# Patient Record
Sex: Male | Born: 1950 | Race: White | Hispanic: No | Marital: Married | Smoking: Never smoker
Health system: Southern US, Community
[De-identification: ages and names within clinical notes are randomized; demographics above are authoritative.]

## PROBLEM LIST (undated history)

## (undated) DIAGNOSIS — R972 Elevated prostate specific antigen [PSA]: Secondary | ICD-10-CM

## (undated) DIAGNOSIS — E785 Hyperlipidemia, unspecified: Secondary | ICD-10-CM

## (undated) DIAGNOSIS — C61 Malignant neoplasm of prostate: Secondary | ICD-10-CM

## (undated) DIAGNOSIS — R519 Headache, unspecified: Secondary | ICD-10-CM

## (undated) DIAGNOSIS — T7840XA Allergy, unspecified, initial encounter: Secondary | ICD-10-CM

## (undated) DIAGNOSIS — Z973 Presence of spectacles and contact lenses: Secondary | ICD-10-CM

## (undated) DIAGNOSIS — K219 Gastro-esophageal reflux disease without esophagitis: Secondary | ICD-10-CM

## (undated) DIAGNOSIS — N400 Enlarged prostate without lower urinary tract symptoms: Secondary | ICD-10-CM

## (undated) DIAGNOSIS — M1990 Unspecified osteoarthritis, unspecified site: Secondary | ICD-10-CM

## (undated) HISTORY — PX: TONSILLECTOMY: SUR1361

## (undated) HISTORY — DX: Allergy, unspecified, initial encounter: T78.40XA

## (undated) HISTORY — DX: Gastro-esophageal reflux disease without esophagitis: K21.9

## (undated) HISTORY — DX: Benign prostatic hyperplasia without lower urinary tract symptoms: N40.0

## (undated) HISTORY — PX: PROSTATE SURGERY: SHX751

## (undated) HISTORY — PX: KNEE ARTHROSCOPY W/ MENISCAL REPAIR: SHX1877

## (undated) HISTORY — PX: WISDOM TOOTH EXTRACTION: SHX21

## (undated) HISTORY — DX: Unspecified osteoarthritis, unspecified site: M19.90

## (undated) HISTORY — PX: PROSTATE BIOPSY: SHX241

## (undated) HISTORY — PX: ROTATOR CUFF REPAIR: SHX139

## (undated) HISTORY — PX: OTHER SURGICAL HISTORY: SHX169

## (undated) HISTORY — DX: Hyperlipidemia, unspecified: E78.5

---

## 1995-03-12 HISTORY — PX: OTHER SURGICAL HISTORY: SHX169

## 2009-04-03 LAB — LIPID PANEL
Cholesterol: 180 (ref 0–200)
HDL: 42 (ref 35–70)
LDL Cholesterol: 109
Triglycerides: 143 (ref 40–160)

## 2009-04-03 LAB — PSA: PSA: 1.41

## 2012-08-27 LAB — LIPID PANEL
Cholesterol: 190 (ref 0–200)
HDL: 38 (ref 35–70)
LDL Cholesterol: 124
Triglycerides: 138 (ref 40–160)

## 2014-03-11 HISTORY — PX: POLYPECTOMY: SHX149

## 2014-03-11 HISTORY — PX: COLONOSCOPY: SHX174

## 2016-01-05 ENCOUNTER — Encounter: Payer: Self-pay | Admitting: *Deleted

## 2016-01-05 ENCOUNTER — Ambulatory Visit
Admission: EM | Admit: 2016-01-05 | Discharge: 2016-01-05 | Disposition: A | Discharge status: Home / Self Care | Payer: BLUE CROSS/BLUE SHIELD | Attending: Family Medicine | Admitting: Family Medicine

## 2016-01-05 DIAGNOSIS — J069 Acute upper respiratory infection, unspecified: Secondary | ICD-10-CM

## 2016-01-05 DIAGNOSIS — J301 Allergic rhinitis due to pollen: Secondary | ICD-10-CM | POA: Diagnosis not present

## 2016-01-05 MED ORDER — FLUTICASONE PROPIONATE 50 MCG/ACT NA SUSP: 2.0000 | Freq: Every day | NASAL | 0 refills | Status: DC

## 2016-01-05 MED ORDER — AZITHROMYCIN 250 MG PO TABS: ORAL_TABLET | ORAL | 0 refills | Status: DC

## 2016-01-05 NOTE — ED Provider Notes (Signed)
CSN: [id number]     Arrival date & time 01/05/16  1031 History   First MD Initiated Contact with Patient 01/05/16 1054     Chief Complaint  Patient presents with  . Nasal Congestion   (Consider location/radiation/quality/duration/timing/severity/associated sxs/prior Treatment) HPI  This a 65 year old male who is visiting from [place] presents with nasal congestion facial pain for 5 days. He states that the symptoms actually started prior to his arrival here by about 3 days. Denies any fever or chills. He states that his head is  full and he has headache. He has had a mild cough since the onset. It is nonproductive.       History reviewed. No pertinent past medical history. Past Surgical History:  Procedure Laterality Date  . right knee surgery     History reviewed. No pertinent family history. Social History  Substance Use Topics  . Smoking status: Never Smoker  . Smokeless tobacco: Never Used  . Alcohol use No    Review of Systems  Constitutional: Positive for activity change. Negative for appetite change, chills, fatigue and fever.  HENT: Positive for congestion, postnasal drip, rhinorrhea, sinus pressure and sneezing.   Respiratory: Positive for cough.   All other systems reviewed and are negative.   Allergies  Review of patient's allergies indicates no known allergies.  Home Medications   Prior to Admission medications   Medication Sig Start Date End Date Taking? Authorizing Provider  azithromycin (ZITHROMAX Z-PAK) 250 MG tablet Take as per package instructions 01/05/16   Lorin Picket, PA-C  fluticasone [facility]) 50 MCG/ACT nasal spray Place 2 sprays into both nostrils daily. 01/05/16   Lorin Picket, PA-C   Meds Ordered and Administered this Visit  Medications - No data to display  BP 127/82 (BP Location: Left Arm)   Pulse 69   Temp 98 F (36.7 C) (Oral)   Resp 16   Ht 6' (1.829 m)   Wt 180 lb (81.6 kg)   SpO2 100%   BMI 24.41 kg/m  No data  found.   Physical Exam  Constitutional: He is oriented to person, place, and time. He appears well-developed and well-nourished. No distress.  HENT:  Head: Normocephalic and atraumatic.  Right Ear: External ear normal.  Left Ear: External ear normal.  Mouth/Throat: Oropharynx is clear and moist. No oropharyngeal exudate.  Nasal mucosa is erythematous and swollen  Eyes: EOM are normal. Pupils are equal, round, and reactive to light.  Neck: Normal range of motion. Neck supple.  Pulmonary/Chest: Effort normal and breath sounds normal. No respiratory distress. He has no wheezes. He has no rales.  Musculoskeletal: Normal range of motion.  Lymphadenopathy:    He has no cervical adenopathy.  Neurological: He is alert and oriented to person, place, and time.  Skin: Skin is warm and dry. He is not diaphoretic.  Psychiatric: He has a normal mood and affect. His behavior is normal. Judgment and thought content normal.  Nursing note and vitals reviewed.   Urgent Care Course   Clinical Course    Procedures (including critical care time)  Labs Review Labs Reviewed - No data to display  Imaging Review No results found.   Visual Acuity Review  Right Eye Distance:   Left Eye Distance:   Bilateral Distance:    Right Eye Near:   Left Eye Near:    Bilateral Near:         MDM   1. Upper respiratory tract infection, unspecified type   2. Acute  seasonal allergic rhinitis due to pollen    Discharge Medication List as of 01/05/2016 11:22 AM    START taking these medications   Details  azithromycin (ZITHROMAX Z-PAK) 250 MG tablet Take as per package instructions, Normal    fluticasone (FLONASE) 50 MCG/ACT nasal spray Place 2 sprays into both nostrils daily., Starting Fri 01/05/2016, Normal      Plan: 1. Test/x-ray results and diagnosis reviewed with patient 2. rx as per orders; risks, benefits, potential side effects reviewed with patient 3. Recommend supportive treatment  with Flonase and Zyrtec. Consider using a Nettie pot. We will up with his primary care physician on his return to [place].  4. F/u prn if symptoms worsen or don't improve     Lorin Picket, PA-C 01/05/16 1140

## 2016-01-05 NOTE — ED Triage Notes (Signed)
Patient has had symptoms of nasal congestion for five days.

## 2016-08-20 LAB — LIPID PANEL
Cholesterol: 202 — AB (ref 0–200)
HDL: 46 (ref 35–70)
LDL Cholesterol: 133
Triglycerides: 117 (ref 40–160)

## 2016-08-20 LAB — BASIC METABOLIC PANEL
BUN: 19 (ref 4–21)
Creatinine: 1 (ref 0.6–1.3)

## 2018-09-26 NOTE — Progress Notes (Addendum)
[facility] [address], [place], [place] [address] [phone]  Date:  09/28/2018   Name:  Johnny Davenport   DOB:  May 25, 1950   MRN:  [id number]  PCP:  Darreld Mclean, MD    Chief Complaint: New Patient (Initial Visit) (left thumb pain) and Gastroesophageal Reflux (comes and goes, no otc )   History of Present Illness:  Johnny Davenport is a 68 y.o. very pleasant male patient who presents with the following:  Gentleman here as a new patient to establish care Married to [place] who I also met recently as a new patient  Here today as a new patient to establish care- he lives in the [place] area with his wife  Recently moved here from [place] to be closer to his son in [place]  Very sadly his son and DIL lost their unborn son recently, a few days prior to planned delivery.  He got the cord wrapped around his neck and was stillborn.  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, fast food He enjoys golf and walking- he gets lot of exercise   He has been a generally healthy guy He does not take any rx medications  He has left thumb pain at the MCP for about 8 months It bothers him at night- not with golf Playing golf actually seems to help  It may seem to get caught/ stuck in the joint No prior injury or fracture to this area  He notes reflux when he eats certain foods- has noted it for a year or so  Bread and pasta seems to bother him the most If he does not eat bread or pasta he does well He may take a tums prn and this helps  Most recent labs about a year ago- he is fasting today  Colon: 12/17; he was given 5 year recall He thinks he got his pneumonia vaccines already He also got shingrix- both doses- done about a year ago   He has  requested medical records for [place] Also brings in several years of labs for review/ abstract  I do notice that his PSA has trended up some-  2011 1.4 2013 1.98 2016 2.02 2018 3.0 2019 3.75 He has not been referred to see urology  There are no active problems to display for this patient.   History reviewed. No pertinent past medical history.  Past Surgical History:  Procedure Laterality Date  . right knee surgery    . ROTATOR CUFF REPAIR Left     Social History   Tobacco Use  . Smoking status: Never Smoker  . Smokeless tobacco: Never Used  Substance Use Topics  . Alcohol use: No  . Drug use: No    History reviewed. No pertinent family history.  No Known Allergies  Medication list has been reviewed and updated.  No current outpatient medications on file prior to visit.   No current facility-administered medications on file prior to visit.     Review of Systems:  As per HPI- otherwise negative.   Physical Examination: Vitals:   09/28/18 0915  BP: 118/78  Pulse: 77  Resp: 16  Temp: 98.1 F (36.7 C)  SpO2: 97%   Vitals:   09/28/18 0915  Weight: 185 lb (83.9 kg)  Height: 6' (1.829 m)   Body mass index is 25.09 kg/m. Ideal Body Weight: Weight in (lb) to have BMI = 25: 183.9  GEN: WDWN, NAD, Non-toxic, A & O x 3, normal weight, looks well  HEENT: Atraumatic, Normocephalic. Neck supple. No masses, No LAD.  TM wnl, PEERL Ears and Nose: No external deformity. CV: RRR, No M/G/R. No JVD. No thrill. No extra heart sounds. PULM: CTA B, no wheezes, crackles, rhonchi. No retractions. No resp. distress. No accessory muscle use. ABD: S, NT, ND, +BS. No rebound. No HSM. EXTR: No c/c/e NEURO Normal gait.  PSYCH: Normally interactive. Conversant. Not depressed or anxious appearing.  Calm demeanor.  The left first MCP shows thickening of the joint and some snapping/ popping with ROM No redness, heat or tenderness    Assessment and Plan:   ICD-10-CM   1. Reflux  esophagitis  K21.0 H. pylori breath test  2. Screening for hyperlipidemia  Z13.220 Lipid panel  3. Screening for deficiency anemia  Z13.0 CBC  4. Screening for diabetes mellitus  Z13.1 Comprehensive metabolic panel  5. Screening for prostate cancer  Z12.5 PSA, Medicare ( [facility] only)  6. Increased prostate specific antigen (PSA) velocity  R97.20 PSA, Medicare ( [facility] only)  7. Medication monitoring encounter  Z51.81 CBC    Comprehensive metabolic panel  8. Mourning  F43.21    Labs pending as above  It was a pleasure to meet you today!  Take care and I will be in touch with your labs asap Let me know if you would like an x-ray and/or ortho referral for your left thumb.  Otherwise, OTC tylenol as needed is a safe option tor pain.  NEVER take more than recommended on the label   We will look for any evidence of H pylori stomach infection as possible cause of your reflux sx.  Assuming this is negative, will plan to rx a PPI for you to use for a few weeks.  Please keep me posted about these symptoms  As we discussed, if your PSA has continued to rise I would suggest that we have you see urology for a consultation  I am so sorry for the tragic loss of your grandson.  Please let me know if you are not doing ok (as ok as can be expected) going forward    Follow-up: No follow-ups on file.  No orders of the defined types were placed in this encounter.  Orders Placed This Encounter  Procedures  . CBC  . Comprehensive metabolic panel  . Lipid panel  . H. pylori breath test  . PSA, Medicare ( [facility] only)    '@SIGN' @    Signed Lamar Blinks, MD  Received his labs, message to patient  Blood counts are normal Metabolic profile is normal Your cholesterol is really not bad.  However, with these numbers your estimated 10-year risk of cardiovascular disease is elevated-see below  The 10-year ASCVD risk score Mikey Bussing DC Brooke Bonito., et al., 2013) is: 15.2%   Values used  to calculate the score:     Age: 52 years     Sex: Male     Is Non-Hispanic African American: No     Diabetic: No     Tobacco smoker: No     Systolic Blood Pressure: 397 mmHg     Is BP treated: No     HDL Cholesterol: 45.1 mg/dL     Total Cholesterol: 191 mg/dL  A cholesterol medication may help to reduce your risk of heart attack or stroke.  Is this something you would be interested in using?  Finally, your PSA has gone up just a bit since last year Your level in 2019 was 3.75.  I would consider this to be no significant change, which is good although I would prefer to see it go down  I would suggest that we either recheck a PSA in 6 months, or refer you to urology for their opinion  Let me know your thoughts on cholesterol medication and your PSA Results for orders placed or performed in visit on 09/28/18  CBC  Result Value Ref Range   WBC 5.0 4.0 - 10.5 K/uL   RBC 4.33 4.22 - 5.81 Mil/uL   Platelets 202.0 150.0 - 400.0 K/uL   Hemoglobin 14.4 13.0 - 17.0 g/dL   HCT 42.3 39.0 - 52.0 %   MCV 97.7 78.0 - 100.0 fl   MCHC 34.0 30.0 - 36.0 g/dL   RDW 13.0 11.5 - 15.5 %  Comprehensive metabolic panel  Result Value Ref Range   Sodium 139 135 - 145 mEq/L   Potassium 4.7 3.5 - 5.1 mEq/L   Chloride 104 96 - 112 mEq/L   CO2 28 19 - 32 mEq/L   Glucose, Bld 85 70 - 99 mg/dL   BUN 19 6 - 23 mg/dL   Creatinine, Ser 1.00 0.40 - 1.50 mg/dL   Total Bilirubin 0.7 0.2 - 1.2 mg/dL   Alkaline Phosphatase 52 39 - 117 U/L   AST 19 0 - 37 U/L   ALT 16 0 - 53 U/L   Total Protein 6.3 6.0 - 8.3 g/dL   Albumin 4.5 3.5 - 5.2 g/dL   Calcium 9.1 8.4 - 10.5 mg/dL   GFR 73.91 >60.00 mL/min  Lipid panel  Result Value Ref Range   Cholesterol 191 0 - 200 mg/dL   Triglycerides 120.0 0.0 - 149.0 mg/dL   HDL 45.10 >39.00 mg/dL   VLDL 24.0 0.0 - 40.0 mg/dL   LDL Cholesterol 122 (H) 0 - 99 mg/dL   Total CHOL/HDL Ratio 4    NonHDL 145.55   PSA, Medicare ( [facility] only)  Result Value Ref Range    PSA 3.80 0.10 - 4.00 ng/ml   The 10-year ASCVD risk score Mikey Bussing DC Jr., et al., 2013) is: 15.2%   Values used to calculate the score:     Age: 6 years     Sex: Male     Is Non-Hispanic African American: No     Diabetic: No     Tobacco smoker: No     Systolic Blood Pressure: 758 mmHg     Is BP treated: No     HDL Cholesterol: 45.1 mg/dL     Total Cholesterol: 191 mg/dL

## 2018-09-28 ENCOUNTER — Encounter: Payer: Self-pay | Admitting: Family Medicine

## 2018-09-28 ENCOUNTER — Other Ambulatory Visit: Payer: Self-pay

## 2018-09-28 ENCOUNTER — Ambulatory Visit (INDEPENDENT_AMBULATORY_CARE_PROVIDER_SITE_OTHER): Payer: Medicare Other | Admitting: Family Medicine

## 2018-09-28 VITALS — BP 118/78 | HR 77 | Temp 98.1°F | Resp 16 | Ht 72.0 in | Wt 185.0 lb

## 2018-09-28 DIAGNOSIS — Z13220 Encounter for screening for lipoid disorders: Secondary | ICD-10-CM

## 2018-09-28 DIAGNOSIS — R972 Elevated prostate specific antigen [PSA]: Secondary | ICD-10-CM | POA: Diagnosis not present

## 2018-09-28 DIAGNOSIS — K2100 Gastro-esophageal reflux disease with esophagitis, without bleeding: Secondary | ICD-10-CM

## 2018-09-28 DIAGNOSIS — Z130 Encounter for screening for diseases of the blood and blood-forming organs and certain disorders involving the immune mechanism: Secondary | ICD-10-CM | POA: Diagnosis not present

## 2018-09-28 DIAGNOSIS — Z131 Encounter for screening for diabetes mellitus: Secondary | ICD-10-CM

## 2018-09-28 DIAGNOSIS — Z5181 Encounter for therapeutic drug level monitoring: Secondary | ICD-10-CM

## 2018-09-28 DIAGNOSIS — Z125 Encounter for screening for malignant neoplasm of prostate: Secondary | ICD-10-CM

## 2018-09-28 DIAGNOSIS — F4321 Adjustment disorder with depressed mood: Secondary | ICD-10-CM

## 2018-09-28 LAB — LIPID PANEL
Cholesterol: 191 mg/dL (ref 0–200)
HDL: 45.1 mg/dL (ref >39.00)
LDL Cholesterol: 122 mg/dL — ABNORMAL HIGH (ref 0–99)
NonHDL: 145.55
Total CHOL/HDL Ratio: 4
Triglycerides: 120 mg/dL (ref 0.0–149.0)
VLDL: 24 mg/dL (ref 0.0–40.0)

## 2018-09-28 LAB — CBC
HCT: 42.3 % (ref 39.0–52.0)
Hemoglobin: 14.4 g/dL (ref 13.0–17.0)
MCHC: 34 g/dL (ref 30.0–36.0)
MCV: 97.7 fl (ref 78.0–100.0)
Platelets: 202 10*3/uL (ref 150.0–400.0)
RBC: 4.33 Mil/uL (ref 4.22–5.81)
RDW: 13 % (ref 11.5–15.5)
WBC: 5 10*3/uL (ref 4.0–10.5)

## 2018-09-28 LAB — COMPREHENSIVE METABOLIC PANEL
ALT: 16 U/L (ref 0–53)
AST: 19 U/L (ref 0–37)
Albumin: 4.5 g/dL (ref 3.5–5.2)
Alkaline Phosphatase: 52 U/L (ref 39–117)
BUN: 19 mg/dL (ref 6–23)
CO2: 28 mEq/L (ref 19–32)
Calcium: 9.1 mg/dL (ref 8.4–10.5)
Chloride: 104 mEq/L (ref 96–112)
Creatinine, Ser: 1 mg/dL (ref 0.40–1.50)
GFR: 73.91 mL/min (ref >60.00)
Glucose, Bld: 85 mg/dL (ref 70–99)
Potassium: 4.7 mEq/L (ref 3.5–5.1)
Sodium: 139 mEq/L (ref 135–145)
Total Bilirubin: 0.7 mg/dL (ref 0.2–1.2)
Total Protein: 6.3 g/dL (ref 6.0–8.3)

## 2018-09-28 LAB — PSA, MEDICARE: PSA: 3.8 ng/ml (ref 0.10–4.00)

## 2018-09-28 NOTE — Patient Instructions (Signed)
It was a pleasure to meet you today!  Take care and I will be in touch with your labs asap Let me know if you would like an x-ray and/or ortho referral for your left thumb.  Otherwise, OTC tylenol as needed is a safe option tor pain.  NEVER take more than recommended on the label   We will look for any evidence of H pylori stomach infection as possible cause of your reflux sx.  Assuming this is negative, will plan to rx a PPI for you to use for a few weeks.  Please keep me posted about these symptoms  As we discussed, if your PSA has continued to rise I would suggest that we have you see urology for a consultation  I am so sorry for the tragic loss of your grandson.  Please let me know if you are not doing ok (as ok as can be expected) going forward

## 2018-09-29 ENCOUNTER — Encounter: Payer: Self-pay | Admitting: Family Medicine

## 2018-09-29 ENCOUNTER — Other Ambulatory Visit: Payer: Self-pay | Admitting: Family Medicine

## 2018-09-29 LAB — H. PYLORI BREATH TEST: H. pylori Breath Test: NOT DETECTED

## 2018-09-29 MED ORDER — OMEPRAZOLE 40 MG PO CPDR: 40.0000 mg | DELAYED_RELEASE_CAPSULE | Freq: Every day | ORAL | 1 refills | Status: DC

## 2018-10-01 ENCOUNTER — Encounter: Payer: Self-pay | Admitting: Family Medicine

## 2018-10-07 ENCOUNTER — Telehealth: Payer: Self-pay

## 2018-10-07 NOTE — Telephone Encounter (Signed)
Copied from [place] [id number]. Topic: General - Other >> Oct 07, 2018  3:05 PM [name] wrote: Patient has noticed a rash on his face since taking the omeprazole (PRILOSEC) 40 MG capsule and would like a callback from nurse.

## 2018-10-09 NOTE — Telephone Encounter (Signed)
Called patient, he states he doesn't think it was the omeprazole. It was an old razor he used that caused razor burn.

## 2018-10-12 ENCOUNTER — Encounter: Payer: Self-pay | Admitting: Family Medicine

## 2018-10-26 ENCOUNTER — Encounter: Payer: Self-pay | Admitting: Family Medicine

## 2018-12-17 ENCOUNTER — Other Ambulatory Visit: Payer: Self-pay

## 2018-12-17 DIAGNOSIS — Z20822 Contact with and (suspected) exposure to covid-19: Secondary | ICD-10-CM

## 2018-12-18 LAB — NOVEL CORONAVIRUS, NAA: SARS-CoV-2, NAA: NOT DETECTED

## 2019-03-12 HISTORY — PX: COLONOSCOPY: SHX174

## 2019-08-02 NOTE — Progress Notes (Signed)
I connected with Zyan today by telephone and verified that I am speaking with the correct person using two identifiers. Location patient: home Location provider: work Persons participating in the virtual visit: patient, Therapist, sports.   I discussed the limitations, risks, security and privacy concerns of performing an evaluation and management service by telephone and the availability of in person appointments. I also discussed with the patient that there may be a patient responsible charge related to this service. The patient expressed understanding and verbally consented to this telephonic visit.    Interactive audio and video telecommunications were attempted between RN and patient, however failed, due to patient having technical difficulties OR patient did not have access to video capability.  We continued and completed visit with audio only.  Some vital signs may be absent or patient reported.     Subjective:   Johnny Davenport is a 69 y.o. male who presents for an Initial Medicare Annual Wellness Visit.  Walks daily and enjoys playing golf 3 days per week.  Review of Systems  Home Safety/Smoke Alarms: Feels safe in home. Smoke alarms in place.  Lives w/ wife in 2 story.  Male:   CCS-    Pt reports last 02/2015. PSA-  Lab Results  Component Value Date   PSA 3.80 09/28/2018   PSA 1.41 04/03/2009      Objective:    Today's Vitals   08/03/19 1112  BP: 115/75   There is no height or weight on file to calculate BMI.  Advanced Directives 08/03/2019 01/05/2016  Does Patient Have a Medical Advance Directive? No No  Would patient like information on creating a medical advance directive? No - Patient declined No - patient declined information    Current Medications (verified) Outpatient Encounter Medications as of 08/03/2019  Medication Sig  . omeprazole (PRILOSEC) 40 MG capsule Take 1 capsule (40 mg total) by mouth daily. (Patient not taking: Reported on 08/03/2019)   No  facility-administered encounter medications on file as of 08/03/2019.    Allergies (verified) Patient has no known allergies.   History: History reviewed. No pertinent past medical history. Past Surgical History:  Procedure Laterality Date  . right knee surgery    . ROTATOR CUFF REPAIR Left    History reviewed. No pertinent family history. Social History   Socioeconomic History  . Marital status: Married    Spouse name: Not on file  . Number of children: Not on file  . Years of education: Not on file  . Highest education level: Not on file  Occupational History  . Not on file  Tobacco Use  . Smoking status: Never Smoker  . Smokeless tobacco: Never Used  Substance and Sexual Activity  . Alcohol use: No  . Drug use: No  . Sexual activity: Not on file  Other Topics Concern  . Not on file  Social History Narrative  . Not on file   Social Determinants of Health   Financial Resource Strain: Low Risk   . Difficulty of Paying Living Expenses: Not hard at all  Food Insecurity: No Food Insecurity  . Worried About Charity fundraiser in the Last Year: Never true  . Ran Out of Food in the Last Year: Never true  Transportation Needs: No Transportation Needs  . Lack of Transportation (Medical): No  . Lack of Transportation (Non-Medical): No  Physical Activity:   . Days of Exercise per Week:   . Minutes of Exercise per Session:   Stress:   . Feeling  of Stress :   Social Connections:   . Frequency of Communication with Friends and Family:   . Frequency of Social Gatherings with Friends and Family:   . Attends Religious Services:   . Active Member of Clubs or Organizations:   . Attends Archivist Meetings:   Marland Kitchen Marital Status:    Tobacco Counseling Counseling given: Not Answered   Clinical Intake:     Pain : No/denies pain                 Activities of Daily Living In your present state of health, do you have any difficulty performing the  following activities: 08/03/2019  Hearing? N  Vision? N  Difficulty concentrating or making decisions? N  Walking or climbing stairs? N  Dressing or bathing? N  Doing errands, shopping? N  Preparing Food and eating ? N  Using the Toilet? N  In the past six months, have you accidently leaked urine? N  Do you have problems with loss of bowel control? N  Managing your Medications? N  Managing your Finances? N  Housekeeping or managing your Housekeeping? N  Some recent data might be hidden     Immunizations and Health Maintenance  There is no immunization history on file for this patient. Health Maintenance Due  Topic Date Due  . Hepatitis C Screening  Never done  . COVID-19 Vaccine (1) Never done  . TETANUS/TDAP  Never done  . COLONOSCOPY  Never done  . PNA vac Low Risk Adult (1 of 2 - PCV13) Never done    Patient Care Team: Copland, Gay Filler, MD as PCP - General (Family Medicine)  Indicate any recent Medical Services you may have received from other than [facility] providers in the past year (date may be approximate).    Assessment:   This is a routine wellness examination for Johnny Davenport. Physical assessment deferred to PCP.  Hearing/Vision screen Unable to assess. This visit is enabled though telemedicine due to Covid 19.   Dietary issues and exercise activities discussed: Current Exercise Habits: Home exercise routine, Time (Minutes): 30, Frequency (Times/Week): 3, Weekly Exercise (Minutes/Week): 90, Intensity: Mild, Exercise limited by: None identified Diet (meal preparation, eat out, water intake, caffeinated beverages, dairy products, fruits and vegetables): well balanced   Goals    . Maintain healthy active lifestyle.      Depression Screen PHQ 2/9 Scores 08/03/2019  PHQ - 2 Score 0    Fall Risk Fall Risk  08/03/2019  Falls in the past year? 0  Number falls in past yr: 0  Injury with Fall? 0  Follow up Education provided;Falls prevention discussed    Cognitive  Function: Ad8 score reviewed for issues:  Issues making decisions:no  Less interest in hobbies / activities:no  Repeats questions, stories (family complaining):no  Trouble using ordinary gadgets (microwave, computer, phone):no  Forgets the month or year: no  Mismanaging finances: no  Remembering appts:no  Daily problems with thinking and/or memory:no Ad8 score is=0        Screening Tests Health Maintenance  Topic Date Due  . Hepatitis C Screening  Never done  . COVID-19 Vaccine (1) Never done  . TETANUS/TDAP  Never done  . COLONOSCOPY  Never done  . PNA vac Low Risk Adult (1 of 2 - PCV13) Never done  . INFLUENZA VACCINE  10/10/2019        Plan:    Please schedule your next medicare wellness visit with me in 1 yr.  Continue  to eat heart healthy diet (full of fruits, vegetables, whole grains, lean protein, water--limit salt, fat, and sugar intake) and increase physical activity as tolerated.  Continue doing brain stimulating activities (puzzles, reading, adult coloring books, staying active) to keep memory sharp.     I have personally reviewed and noted the following in the patient's chart:   . Medical and social history . Use of alcohol, tobacco or illicit drugs  . Current medications and supplements . Functional ability and status . Nutritional status . Physical activity . Advanced directives . List of other physicians . Hospitalizations, surgeries, and ER visits in previous 12 months . Vitals . Screenings to include cognitive, depression, and falls . Referrals and appointments  In addition, I have reviewed and discussed with patient certain preventive protocols, quality metrics, and best practice recommendations. A written personalized care plan for preventive services as well as general preventive health recommendations were provided to patient.     Naaman Plummer [place], [place]   08/03/2019

## 2019-08-03 ENCOUNTER — Encounter: Payer: Self-pay | Admitting: *Deleted

## 2019-08-03 ENCOUNTER — Telehealth: Payer: Self-pay

## 2019-08-03 ENCOUNTER — Other Ambulatory Visit: Payer: Self-pay

## 2019-08-03 ENCOUNTER — Ambulatory Visit (INDEPENDENT_AMBULATORY_CARE_PROVIDER_SITE_OTHER): Payer: Medicare Other | Admitting: *Deleted

## 2019-08-03 VITALS — BP 115/75

## 2019-08-03 DIAGNOSIS — Z0000 Encounter for general adult medical examination without abnormal findings: Secondary | ICD-10-CM

## 2019-08-03 NOTE — Patient Instructions (Signed)
Please schedule your next medicare wellness visit with me in 1 yr.  Continue to eat heart healthy diet (full of fruits, vegetables, whole grains, lean protein, water--limit salt, fat, and sugar intake) and increase physical activity as tolerated.  Continue doing brain stimulating activities (puzzles, reading, adult coloring books, staying active) to keep memory sharp.    Johnny Davenport , Thank you for taking time to come for your Medicare Wellness Visit. I appreciate your ongoing commitment to your health goals. Please review the following plan we discussed and let me know if I can assist you in the future.   These are the goals we discussed: Goals    . Maintain healthy active lifestyle.       This is a list of the screening recommended for you and due dates:  Health Maintenance  Topic Date Due  .  Hepatitis C: One time screening is recommended by Center for Disease Control  (CDC) for  adults born from [birth year] through 1967.   Never done  . COVID-19 Vaccine (1) Never done  . Tetanus Vaccine  Never done  . Colon Cancer Screening  Never done  . Pneumonia vaccines (1 of 2 - PCV13) Never done  . Flu Shot  10/10/2019    Preventive Care 65 Years and Older, Male Preventive care refers to lifestyle choices and visits with your health care provider that can promote health and wellness. This includes:  A yearly physical exam. This is also called an annual well check.  Regular dental and eye exams.  Immunizations.  Screening for certain conditions.  Healthy lifestyle choices, such as diet and exercise. What can I expect for my preventive care visit? Physical exam Your health care provider will check:  Height and weight. These may be used to calculate body mass index (BMI), which is a measurement that tells if you are at a healthy weight.  Heart rate and blood pressure.  Your skin for abnormal spots. Counseling Your health care provider may ask you questions about:  Alcohol, tobacco,  and drug use.  Emotional well-being.  Home and relationship well-being.  Sexual activity.  Eating habits.  History of falls.  Memory and ability to understand (cognition).  Work and work Statistician. What immunizations do I need?  Influenza (flu) vaccine  This is recommended every year. Tetanus, diphtheria, and pertussis (Tdap) vaccine  You may need a Td booster every 10 years. Varicella (chickenpox) vaccine  You may need this vaccine if you have not already been vaccinated. Zoster (shingles) vaccine  You may need this after age 19. Pneumococcal conjugate (PCV13) vaccine  One dose is recommended after age 14. Pneumococcal polysaccharide (PPSV23) vaccine  One dose is recommended after age 36. Measles, mumps, and rubella (MMR) vaccine  You may need at least one dose of MMR if you were born in 1957 or later. You may also need a second dose. Meningococcal conjugate (MenACWY) vaccine  You may need this if you have certain conditions. Hepatitis A vaccine  You may need this if you have certain conditions or if you travel or work in places where you may be exposed to hepatitis A. Hepatitis B vaccine  You may need this if you have certain conditions or if you travel or work in places where you may be exposed to hepatitis B. Haemophilus influenzae type b (Hib) vaccine  You may need this if you have certain conditions. You may receive vaccines as individual doses or as more than one vaccine together in one shot (  combination vaccines). Talk with your health care provider about the risks and benefits of combination vaccines. What tests do I need? Blood tests  Lipid and cholesterol levels. These may be checked every 5 years, or more frequently depending on your overall health.  Hepatitis C test.  Hepatitis B test. Screening  Lung cancer screening. You may have this screening every year starting at age 58 if you have a 30-pack-year history of smoking and currently smoke  or have quit within the past 15 years.  Colorectal cancer screening. All adults should have this screening starting at age 8 and continuing until age 39. Your health care provider may recommend screening at age 35 if you are at increased risk. You will have tests every 1-10 years, depending on your results and the type of screening test.  Prostate cancer screening. Recommendations will vary depending on your family history and other risks.  Diabetes screening. This is done by checking your blood sugar (glucose) after you have not eaten for a while (fasting). You may have this done every 1-3 years.  Abdominal aortic aneurysm (AAA) screening. You may need this if you are a current or former smoker.  Sexually transmitted disease (STD) testing. Follow these instructions at home: Eating and drinking  Eat a diet that includes fresh fruits and vegetables, whole grains, lean protein, and low-fat dairy products. Limit your intake of foods with high amounts of sugar, saturated fats, and salt.  Take vitamin and mineral supplements as recommended by your health care provider.  Do not drink alcohol if your health care provider tells you not to drink.  If you drink alcohol: ? Limit how much you have to 0-2 drinks a day. ? Be aware of how much alcohol is in your drink. In the [place] one drink equals one 12 oz bottle of beer (355 mL), one 5 oz glass of wine (148 mL), or one 1 oz glass of hard liquor (44 mL). Lifestyle  Take daily care of your teeth and gums.  Stay active. Exercise for at least 30 minutes on 5 or more days each week.  Do not use any products that contain nicotine or tobacco, such as cigarettes, e-cigarettes, and chewing tobacco. If you need help quitting, ask your health care provider.  If you are sexually active, practice safe sex. Use a condom or other form of protection to prevent STIs (sexually transmitted infections).  Talk with your health care provider about taking a low-dose  aspirin or statin. What's next?  Visit your health care provider once a year for a well check visit.  Ask your health care provider how often you should have your eyes and teeth checked.  Stay up to date on all vaccines. This information is not intended to replace advice given to you by your health care provider. Make sure you discuss any questions you have with your health care provider. Document Revised: 02/19/2018 Document Reviewed: 02/19/2018 Elsevier Patient Education  2020 [employer].

## 2019-08-03 NOTE — Telephone Encounter (Signed)
Chart info updated per pt.

## 2019-08-03 NOTE — Telephone Encounter (Signed)
Patient called in to speak with Angle about updating some information on his profile. Please call the patient back at [phone]

## 2019-10-02 NOTE — Progress Notes (Addendum)
[facility] [place], [place], [place] [address] [phone]  Date:  10/04/2019   Name:  Johnny Davenport   DOB:  1951-02-12   MRN:  [id number]  PCP:  Johnny Mclean, MD    Chief Complaint: Annual Exam and Colonoscopy (needs referral for colonoscopy )   History of Present Illness:  Johnny Davenport is a 69 y.o. very pleasant male patient who presents with the following:  Generally healthy older gentleman here today for a CPE Last seen by myself about one year ago when he was struggling with GERD   Hep C screening tdap- this was done in 06/2018 Colon cancer screen coming due- he is on the 5 year plan, would like a referral to GI Can give prevnar next time  covid series done shingrix - done per pt  Labs now due  He is married to Johnny Davenport, has 2 sons and recently retired to this area from [place] His son lost a child to stillbirth last year but they recently delivered a healthy child- baby boy [name] grandmother  Lab Results  Component Value Date   PSA 3.80 09/28/2018   PSA 1.41 04/03/2009   His PSA in 2019 was 3.75- will need to recheck today with labs  I suggested a cholesterol med at last visit- he wanted to work on lifestyle first and see effect  Never a smoker  His home BP may run 130s/80- not higher   He notes that he tends to cough up some clear material in the am esp.  He got allergy tested and was all negative.  However he does notice that he may allergic to his dog.   He will noticed increased allergy symptoms if he drives somewhere with his dog in the car Suggested that he try an OTC claritin or zyrtec  BP Readings from Last 3 Encounters:  10/04/19 (!) 136/82  08/03/19 115/75  09/28/18 118/78    Wt Readings from Last 3 Encounters:  10/04/19 194 lb (88 kg)  09/28/18 185 lb (83.9 kg)  01/05/16 180 lb (81.6 kg)   He has noted some pain in his bilateral first MCP joints, and has been told  this was arthritis He plans to start an exercise program His parents lived to old age He plans to start exercising- he hopes to lose weight.  He hopes to begin swimming and lifting weights He does 12- 15k steps per day already, He pushes the lawnmower at home; may push the melena for over an hour, no chest pain or shortness of breath  He will get a migraine HA with stress or weather change.   He may get a migraine every 6 weeks or so, will last for 6 hours Resting in a dark room will help No aura, no vomiting He would like to try a triptan which I will rx for him  He has used prilosec for GERD In the past- certain foods will seem to cause the reflux prilosec worked well in the past, Uses as needed  There are no problems to display for this patient.   History reviewed. No pertinent past medical history.  Past Surgical History:  Procedure Laterality Date  . right knee surgery    . ROTATOR CUFF REPAIR Left     Social History   Tobacco Use  . Smoking status: Never Smoker  . Smokeless tobacco: Never Used  Substance Use Topics  . Alcohol use: No  .  Drug use: No    History reviewed. No pertinent family history.  No Known Allergies  Medication list has been reviewed and updated.  No current outpatient medications on file prior to visit.   No current facility-administered medications on file prior to visit.    Review of Systems:  As per HPI- otherwise negative.   Physical Examination: Vitals:   10/04/19 1005  BP: (!) 136/82  Pulse: 86  Resp: 16  SpO2: 97%   Vitals:   10/04/19 1005  Weight: 194 lb (88 kg)  Height: 6' (1.829 m)   Body mass index is 26.31 kg/m. Ideal Body Weight: Weight in (lb) to have BMI = 25: 183.9  GEN: no acute distress.  Minimal overweight, looks well HEENT: Atraumatic, Normocephalic.   Bilateral TM wnl, oropharynx normal.  PEERL,EOMI.   Ears and Nose: No external deformity. CV: RRR, No M/G/R. No JVD. No thrill. No extra heart  sounds. PULM: CTA B, no wheezes, crackles, rhonchi. No retractions. No resp. distress. No accessory muscle use. ABD: S, NT, ND, +BS. No rebound. No HSM. EXTR: No c/c/e PSYCH: Normally interactive. Conversant.    Assessment and Plan: Physical exam  Screening for deficiency anemia - Plan: CBC  Screening for diabetes mellitus - Plan: Comprehensive metabolic panel  Screening for prostate cancer - Plan: PSA, Medicare ( [facility] only)  Screening for hyperlipidemia - Plan: Lipid panel  Encounter for hepatitis C screening test for low risk patient - Plan: Hepatitis C antibody  Migraine without aura and without status migrainosus, not intractable - Plan: SUMAtriptan (IMITREX) 50 MG tablet  Screening for malignant neoplasm of colon - Plan: Ambulatory referral to Gastroenterology  Gastroesophageal reflux disease, unspecified whether esophagitis present - Plan: omeprazole (PRILOSEC) 20 MG capsule  Medication monitoring encounter - Plan: CBC  Here today for routine physical Labs are pending as above Referral to GI Immunizations up-to-date Refill Prilosec to use as needed for GERD, he will try to avoid triggers He plans to work more exercise to lose a few pounds he has gained Prescription for triptan that he can try as needed for headaches Will plan further follow- up pending labs. This visit occurred during the SARS-CoV-2 public health emergency.  Safety protocols were in place, including screening questions prior to the visit, additional usage of staff PPE, and extensive cleaning of exam room while observing appropriate contact time as indicated for disinfecting solutions.     Signed Johnny Blinks, MD  Received his labs as below, message to patient  Blood counts are normal Metabolic profile looks fine Your cholesterol profile afraid has worsened a bit since last year With these numbers, I calculated your estimated 10-year risk of cardiovascular disease below:  The  10-year ASCVD risk score Mikey Bussing DC Brooke Bonito., et al., 2013) is: 21.6%   Values used to calculate the score:     Age: 5 years     Sex: Male     Is Non-Hispanic African American: No     Diabetic: No     Tobacco smoker: No     Systolic Blood Pressure: 062 mmHg     Is BP treated: No     HDL Cholesterol: 45.1 mg/dL     Total Cholesterol: 216 mg/dL  Although you are generally in good health, and I know you plan to work on exercise and weight loss I do not like this 21% risk.  I would recommend that we start you on a cholesterol medication to reduce this risk for you-please let me  know your thoughts.  I am glad to send in a prescription for you  Your PSA also has climbed since last year; this may certainly be due to benign prostatic enlargement, but I am a bit concerned.  I would recommend that we have you see a urologist locally; we will go ahead and place referral for you, let me know if you prefer a different plan  Take care, please continue to monitor your blood pressure.  Let us visit in about 6 months to check on your blood pressure and cholesterol  Results for orders placed or performed in visit on 10/04/19  CBC  Result Value Ref Range   WBC 6.0 4.0 - 10.5 K/uL   RBC 4.36 4.22 - 5.81 Mil/uL   Platelets 189.0 150 - 400 K/uL   Hemoglobin 14.5 13.0 - 17.0 g/dL   HCT 42.3 39 - 52 %   MCV 97.0 78.0 - 100.0 fl   MCHC 34.4 30.0 - 36.0 g/dL   RDW 12.8 11.5 - 15.5 %  Comprehensive metabolic panel  Result Value Ref Range   Sodium 139 135 - 145 mEq/L   Potassium 4.5 3.5 - 5.1 mEq/L   Chloride 104 96 - 112 mEq/L   CO2 28 19 - 32 mEq/L   Glucose, Bld 80 70 - 99 mg/dL   BUN 17 6 - 23 mg/dL   Creatinine, Ser 1.09 0.40 - 1.50 mg/dL   Total Bilirubin 0.8 0.2 - 1.2 mg/dL   Alkaline Phosphatase 57 39 - 117 U/L   AST 23 0 - 37 U/L   ALT 22 0 - 53 U/L   Total Protein 6.6 6.0 - 8.3 g/dL   Albumin 4.5 3.5 - 5.2 g/dL   GFR 66.72 >60.00 mL/min   Calcium 9.3 8.4 - 10.5 mg/dL  Lipid panel  Result Value  Ref Range   Cholesterol 216 (H) 0 - 200 mg/dL   Triglycerides 131.0 0 - 149 mg/dL   HDL 45.10 >39.00 mg/dL   VLDL 26.2 0.0 - 40.0 mg/dL   LDL Cholesterol 145 (H) 0 - 99 mg/dL   Total CHOL/HDL Ratio 5    NonHDL 170.70   PSA, Medicare ( [facility] only)  Result Value Ref Range   PSA 4.25 (H) 0.10 - 4.00 ng/ml    Lab Results  Component Value Date   PSA 4.25 (H) 10/04/2019   PSA 3.80 09/28/2018   PSA 1.41 04/03/2009

## 2019-10-02 NOTE — Patient Instructions (Signed)
Great to see you again today!  I will be in touch with your labs asap  Try taking an imitrex at the first sign of migraine headache- you can repeat the dose in 2 hours (max 100 mg in 24 hours) Use prilosec off and on as needed for your GERD sx We will monitor your BP; please see me in about 6 months so we can check on your BP.  As you exercise and lose weight this may come down- if it remains elevated we can start a medication for you    Health Maintenance After Age 86 After age 45, you are at a higher risk for certain long-term diseases and infections as well as injuries from falls. Falls are a major cause of broken bones and head injuries in people who are older than age 28. Getting regular preventive care can help to keep you healthy and well. Preventive care includes getting regular testing and making lifestyle changes as recommended by your health care provider. Talk with your health care provider about:  Which screenings and tests you should have. A screening is a test that checks for a disease when you have no symptoms.  A diet and exercise plan that is right for you. What should I know about screenings and tests to prevent falls? Screening and testing are the best ways to find a health problem early. Early diagnosis and treatment give you the best chance of managing medical conditions that are common after age 75. Certain conditions and lifestyle choices may make you more likely to have a fall. Your health care provider may recommend:  Regular vision checks. Poor vision and conditions such as cataracts can make you more likely to have a fall. If you wear glasses, make sure to get your prescription updated if your vision changes.  Medicine review. Work with your health care provider to regularly review all of the medicines you are taking, including over-the-counter medicines. Ask your health care provider about any side effects that may make you more likely to have a fall. Tell your health  care provider if any medicines that you take make you feel dizzy or sleepy.  Osteoporosis screening. Osteoporosis is a condition that causes the bones to get weaker. This can make the bones weak and cause them to break more easily.  Blood pressure screening. Blood pressure changes and medicines to control blood pressure can make you feel dizzy.  Strength and balance checks. Your health care provider may recommend certain tests to check your strength and balance while standing, walking, or changing positions.  Foot health exam. Foot pain and numbness, as well as not wearing proper footwear, can make you more likely to have a fall.  Depression screening. You may be more likely to have a fall if you have a fear of falling, feel emotionally low, or feel unable to do activities that you used to do.  Alcohol use screening. Using too much alcohol can affect your balance and may make you more likely to have a fall. What actions can I take to lower my risk of falls? General instructions  Talk with your health care provider about your risks for falling. Tell your health care provider if: ? You fall. Be sure to tell your health care provider about all falls, even ones that seem minor. ? You feel dizzy, sleepy, or off-balance.  Take over-the-counter and prescription medicines only as told by your health care provider. These include any supplements.  Eat a healthy diet and maintain  a healthy weight. A healthy diet includes low-fat dairy products, low-fat (lean) meats, and fiber from whole grains, beans, and lots of fruits and vegetables. Home safety  Remove any tripping hazards, such as rugs, cords, and clutter.  Install safety equipment such as grab bars in bathrooms and safety rails on stairs.  Keep rooms and walkways well-lit. Activity   Follow a regular exercise program to stay fit. This will help you maintain your balance. Ask your health care provider what types of exercise are appropriate  for you.  If you need a cane or walker, use it as recommended by your health care provider.  Wear supportive shoes that have nonskid soles. Lifestyle  Do not drink alcohol if your health care provider tells you not to drink.  If you drink alcohol, limit how much you have: ? 0-1 drink a day for women. ? 0-2 drinks a day for men.  Be aware of how much alcohol is in your drink. In the [place] one drink equals one typical bottle of beer (12 oz), one-half glass of wine (5 oz), or one shot of hard liquor (1 oz).  Do not use any products that contain nicotine or tobacco, such as cigarettes and e-cigarettes. If you need help quitting, ask your health care provider. Summary  Having a healthy lifestyle and getting preventive care can help to protect your health and wellness after age 72.  Screening and testing are the best way to find a health problem early and help you avoid having a fall. Early diagnosis and treatment give you the best chance for managing medical conditions that are more common for people who are older than age 39.  Falls are a major cause of broken bones and head injuries in people who are older than age 93. Take precautions to prevent a fall at home.  Work with your health care provider to learn what changes you can make to improve your health and wellness and to prevent falls. This information is not intended to replace advice given to you by your health care provider. Make sure you discuss any questions you have with your health care provider. Document Revised: 06/18/2018 Document Reviewed: 01/08/2017 Elsevier Patient Education  2020 [employer].

## 2019-10-04 ENCOUNTER — Encounter: Payer: Self-pay | Admitting: Family Medicine

## 2019-10-04 ENCOUNTER — Ambulatory Visit (INDEPENDENT_AMBULATORY_CARE_PROVIDER_SITE_OTHER): Payer: Medicare Other | Admitting: Family Medicine

## 2019-10-04 ENCOUNTER — Other Ambulatory Visit: Payer: Self-pay

## 2019-10-04 VITALS — BP 136/82 | HR 86 | Resp 16 | Ht 72.0 in | Wt 194.0 lb

## 2019-10-04 DIAGNOSIS — E785 Hyperlipidemia, unspecified: Secondary | ICD-10-CM

## 2019-10-04 DIAGNOSIS — Z5181 Encounter for therapeutic drug level monitoring: Secondary | ICD-10-CM

## 2019-10-04 DIAGNOSIS — Z130 Encounter for screening for diseases of the blood and blood-forming organs and certain disorders involving the immune mechanism: Secondary | ICD-10-CM | POA: Diagnosis not present

## 2019-10-04 DIAGNOSIS — Z0000 Encounter for general adult medical examination without abnormal findings: Secondary | ICD-10-CM

## 2019-10-04 DIAGNOSIS — Z1211 Encounter for screening for malignant neoplasm of colon: Secondary | ICD-10-CM

## 2019-10-04 DIAGNOSIS — Z131 Encounter for screening for diabetes mellitus: Secondary | ICD-10-CM | POA: Diagnosis not present

## 2019-10-04 DIAGNOSIS — K219 Gastro-esophageal reflux disease without esophagitis: Secondary | ICD-10-CM

## 2019-10-04 DIAGNOSIS — R972 Elevated prostate specific antigen [PSA]: Secondary | ICD-10-CM

## 2019-10-04 DIAGNOSIS — Z125 Encounter for screening for malignant neoplasm of prostate: Secondary | ICD-10-CM | POA: Diagnosis not present

## 2019-10-04 DIAGNOSIS — Z1159 Encounter for screening for other viral diseases: Secondary | ICD-10-CM

## 2019-10-04 DIAGNOSIS — Z13220 Encounter for screening for lipoid disorders: Secondary | ICD-10-CM

## 2019-10-04 DIAGNOSIS — G43009 Migraine without aura, not intractable, without status migrainosus: Secondary | ICD-10-CM

## 2019-10-04 LAB — COMPREHENSIVE METABOLIC PANEL
ALT: 22 U/L (ref 0–53)
AST: 23 U/L (ref 0–37)
Albumin: 4.5 g/dL (ref 3.5–5.2)
Alkaline Phosphatase: 57 U/L (ref 39–117)
BUN: 17 mg/dL (ref 6–23)
CO2: 28 mEq/L (ref 19–32)
Calcium: 9.3 mg/dL (ref 8.4–10.5)
Chloride: 104 mEq/L (ref 96–112)
Creatinine, Ser: 1.09 mg/dL (ref 0.40–1.50)
GFR: 66.72 mL/min (ref >60.00)
Glucose, Bld: 80 mg/dL (ref 70–99)
Potassium: 4.5 mEq/L (ref 3.5–5.1)
Sodium: 139 mEq/L (ref 135–145)
Total Bilirubin: 0.8 mg/dL (ref 0.2–1.2)
Total Protein: 6.6 g/dL (ref 6.0–8.3)

## 2019-10-04 LAB — LIPID PANEL
Cholesterol: 216 mg/dL — ABNORMAL HIGH (ref 0–200)
HDL: 45.1 mg/dL (ref >39.00)
LDL Cholesterol: 145 mg/dL — ABNORMAL HIGH (ref 0–99)
NonHDL: 170.7
Total CHOL/HDL Ratio: 5
Triglycerides: 131 mg/dL (ref 0.0–149.0)
VLDL: 26.2 mg/dL (ref 0.0–40.0)

## 2019-10-04 LAB — PSA, MEDICARE: PSA: 4.25 ng/ml — ABNORMAL HIGH (ref 0.10–4.00)

## 2019-10-04 LAB — CBC
HCT: 42.3 % (ref 39.0–52.0)
Hemoglobin: 14.5 g/dL (ref 13.0–17.0)
MCHC: 34.4 g/dL (ref 30.0–36.0)
MCV: 97 fl (ref 78.0–100.0)
Platelets: 189 10*3/uL (ref 150.0–400.0)
RBC: 4.36 Mil/uL (ref 4.22–5.81)
RDW: 12.8 % (ref 11.5–15.5)
WBC: 6 10*3/uL (ref 4.0–10.5)

## 2019-10-04 MED ORDER — OMEPRAZOLE 20 MG PO CPDR
20.0000 mg | DELAYED_RELEASE_CAPSULE | Freq: Every day | ORAL | 2 refills | Status: DC
Start: 2019-10-04 — End: 2020-02-08

## 2019-10-04 MED ORDER — SUMATRIPTAN SUCCINATE 50 MG PO TABS: 50.0000 mg | ORAL_TABLET | ORAL | 3 refills | Status: DC | PRN

## 2019-10-04 NOTE — Addendum Note (Signed)
Addended by: Lamar Blinks C on: 10/04/2019 07:27 PM   Modules accepted: Orders

## 2019-10-05 LAB — HEPATITIS C ANTIBODY
Hepatitis C Ab: NONREACTIVE
SIGNAL TO CUT-OFF: 0.01 (ref <1.00)

## 2019-10-05 MED ORDER — ROSUVASTATIN CALCIUM 10 MG PO TABS: 10.0000 mg | ORAL_TABLET | Freq: Every day | ORAL | 3 refills | Status: DC

## 2019-10-05 NOTE — Addendum Note (Signed)
Addended by: Lamar Blinks C on: 10/05/2019 08:08 AM   Modules accepted: Orders

## 2019-10-25 ENCOUNTER — Encounter: Payer: Self-pay | Admitting: Family Medicine

## 2019-11-30 ENCOUNTER — Telehealth: Payer: Self-pay | Admitting: Family Medicine

## 2019-11-30 NOTE — Telephone Encounter (Signed)
I sent an email to [employer] in regards to the outstanding pt balance of $364.00 asking if codes for DOS on 10/19/19 can be reviwed.  It appears as though a payment of $25.00 was made today on pt's account.  [employer] will follow up with pt in regards to billing dispute.

## 2019-11-30 NOTE — Telephone Encounter (Signed)
Patient  called in reference to bill she received for annual visit with Dr. Lorelei Pont. Patient states bill was $25.00, patient would like bill re coded and sent to insurance for payment.

## 2019-12-09 ENCOUNTER — Encounter: Payer: Self-pay | Admitting: Family Medicine

## 2019-12-09 DIAGNOSIS — Z1211 Encounter for screening for malignant neoplasm of colon: Secondary | ICD-10-CM

## 2019-12-14 ENCOUNTER — Telehealth: Payer: Self-pay | Admitting: Gastroenterology

## 2019-12-14 NOTE — Telephone Encounter (Signed)
Hi Dr. Rush Landmark,  We received a referral from PCP for a repeat colonoscopy. Patient had a colonoscopy done back in 2016 with [facility]. Obtained reports for review.   Please advise on scheduling.  Thank you

## 2019-12-14 NOTE — Telephone Encounter (Signed)
I have reviewed the records that have been placed in my inbox. These will be scanned into the chart. Patient had a colonoscopy in December 2016 in [place]. Patient was found to have normal mucosa in the terminal ileum. A single sessile 3 mm polyp in the ascending colon was resected via cold snare.  A single 3 mm polyp was found in the transverse colon.  This was resected with cold snare.  Medium grade 2 internal hemorrhoids were noted. 5-year colonoscopy was recommended. 2 tubular adenomas were found on pathology.  Based on the prior recommendations and myself not having performed this colonoscopy, I believe that follow-up surveillance colonoscopy in a 5-year manner which would be due this year is very reasonable. Once we complete his current colonoscopy we will update his surveillance based on updated guidelines.  Please move forward with scheduling colonoscopy as a direct procedure.  Should the patient want to be seen in clinic I am happy to see him.  Justice Britain, MD [facility]oscopy Office # [phone]

## 2019-12-16 ENCOUNTER — Encounter: Payer: Self-pay | Admitting: Gastroenterology

## 2019-12-28 DIAGNOSIS — R300 Dysuria: Secondary | ICD-10-CM

## 2019-12-28 DIAGNOSIS — R339 Retention of urine, unspecified: Secondary | ICD-10-CM

## 2019-12-28 HISTORY — DX: Retention of urine, unspecified: R33.9

## 2019-12-28 HISTORY — DX: Dysuria: R30.0

## 2020-01-03 ENCOUNTER — Encounter: Payer: Self-pay | Admitting: Family Medicine

## 2020-01-05 ENCOUNTER — Encounter: Payer: Self-pay | Admitting: Family Medicine

## 2020-01-28 ENCOUNTER — Encounter: Payer: Self-pay | Admitting: Family Medicine

## 2020-01-30 ENCOUNTER — Encounter: Payer: Self-pay | Admitting: Family Medicine

## 2020-01-30 DIAGNOSIS — R972 Elevated prostate specific antigen [PSA]: Secondary | ICD-10-CM

## 2020-01-30 DIAGNOSIS — E785 Hyperlipidemia, unspecified: Secondary | ICD-10-CM

## 2020-01-31 ENCOUNTER — Encounter: Payer: Self-pay | Admitting: Family Medicine

## 2020-02-08 ENCOUNTER — Ambulatory Visit (AMBULATORY_SURGERY_CENTER): Payer: Self-pay

## 2020-02-08 ENCOUNTER — Other Ambulatory Visit: Payer: Self-pay

## 2020-02-08 VITALS — Ht 72.0 in | Wt 182.0 lb

## 2020-02-08 DIAGNOSIS — Z86010 Personal history of colonic polyps: Secondary | ICD-10-CM

## 2020-02-08 NOTE — Progress Notes (Signed)
No egg or soy allergy known to patient  No issues with past sedation with any surgeries or procedures No intubation problems in the past  No FH of Malignant Hyperthermia No diet pills per patient No home 02 use per patient  No blood thinners per patient  Pt denies issues with constipation  No A fib or A flutter  EMMI video via [place] 19 guidelines implemented in PV today with Pt and RN  COVID vaccines completed on 04/2019 per pt; Due to the COVID-19 pandemic we are asking patients to follow these guidelines. Please only bring one care partner. Please be aware that your care partner may wait in the car in the parking lot or if they feel like they will be too hot to wait in the car, they may wait in the lobby on the 4th floor. All care partners are required to wear a mask the entire time (we do not have any that we can provide them), they need to practice social distancing, and we will do a Covid check for all patient's and care partners when you arrive. Also we will check their temperature and your temperature. If the care partner waits in their car they need to stay in the parking lot the entire time and we will call them on their cell phone when the patient is ready for discharge so they can bring the car to the front of the building. Also all patient's will need to wear a mask into building.

## 2020-02-22 ENCOUNTER — Other Ambulatory Visit: Payer: Self-pay

## 2020-02-22 ENCOUNTER — Ambulatory Visit (AMBULATORY_SURGERY_CENTER): Payer: Medicare Other | Admitting: Gastroenterology

## 2020-02-22 ENCOUNTER — Encounter: Payer: Self-pay | Admitting: Gastroenterology

## 2020-02-22 VITALS — BP 126/85 | HR 81 | Temp 98.2°F | Resp 15 | Ht 72.0 in | Wt 182.0 lb

## 2020-02-22 DIAGNOSIS — Z1211 Encounter for screening for malignant neoplasm of colon: Secondary | ICD-10-CM

## 2020-02-22 DIAGNOSIS — K635 Polyp of colon: Secondary | ICD-10-CM | POA: Diagnosis not present

## 2020-02-22 DIAGNOSIS — D120 Benign neoplasm of cecum: Secondary | ICD-10-CM

## 2020-02-22 MED ORDER — SODIUM CHLORIDE 0.9 % IV SOLN: 500.0000 mL | Freq: Once | INTRAVENOUS | Status: DC

## 2020-02-22 NOTE — Progress Notes (Signed)
855 Robinul 0.1 mg IV given due large amount of secretions upon assessment.  MD made aware, vss

## 2020-02-22 NOTE — Patient Instructions (Signed)
Handouts provided on polyps, diverticulosis, hemorrhoids and high-fiber diet.   High-fiber diet.  Use FiberCon 1-2 tablets by mouth daily. (This is an over-th-counter medication)  YOU HAD AN ENDOSCOPIC PROCEDURE TODAY AT [place]:   Refer to the procedure report that was given to you for any specific questions about what was found during the examination.  If the procedure report does not answer your questions, please call your gastroenterologist to clarify.  If you requested that your care partner not be given the details of your procedure findings, then the procedure report has been included in a sealed envelope for you to review at your convenience later.  YOU SHOULD EXPECT: Some feelings of bloating in the abdomen. Passage of more gas than usual.  Walking can help get rid of the air that was put into your GI tract during the procedure and reduce the bloating. If you had a lower endoscopy (such as a colonoscopy or flexible sigmoidoscopy) you may notice spotting of blood in your stool or on the toilet paper. If you underwent a bowel prep for your procedure, you may not have a normal bowel movement for a few days.  Please Note:  You might notice some irritation and congestion in your nose or some drainage.  This is from the oxygen used during your procedure.  There is no need for concern and it should clear up in a day or so.  SYMPTOMS TO REPORT IMMEDIATELY:   Following lower endoscopy (colonoscopy or flexible sigmoidoscopy):  Excessive amounts of blood in the stool  Significant tenderness or worsening of abdominal pains  Swelling of the abdomen that is new, acute  Fever of 100F or higher  For urgent or emergent issues, a gastroenterologist can be reached at any hour by calling [phone]. Do not use MyChart messaging for urgent concerns.    DIET:  We do recommend a small meal at first, but then you may proceed to your regular diet.  Drink plenty of fluids but you  should avoid alcoholic beverages for 24 hours.  ACTIVITY:  You should plan to take it easy for the rest of today and you should NOT DRIVE or use heavy machinery until tomorrow (because of the sedation medicines used during the test).    FOLLOW UP: Our staff will call the number listed on your records 48-72 hours following your procedure to check on you and address any questions or concerns that you may have regarding the information given to you following your procedure. If we do not reach you, we will leave a message.  We will attempt to reach you two times.  During this call, we will ask if you have developed any symptoms of COVID 19. If you develop any symptoms (ie: fever, flu-like symptoms, shortness of breath, cough etc.) before then, please call [phone].  If you test positive for Covid 19 in the 2 weeks post procedure, please call and report this information to [place].    If any biopsies were taken you will be contacted by phone or by letter within the next 1-3 weeks.  Please call us at [phone] if you have not heard about the biopsies in 3 weeks.    SIGNATURES/CONFIDENTIALITY: You and/or your care partner have signed paperwork which will be entered into your electronic medical record.  These signatures attest to the fact that that the information above on your After Visit Summary has been reviewed and is understood.  Full responsibility of the confidentiality of this  discharge information lies with you and/or your care-partner.

## 2020-02-22 NOTE — Progress Notes (Signed)
Called to room to assist during endoscopic procedure.  Patient ID and intended procedure confirmed with present staff. Received instructions for my participation in the procedure from the performing physician. °

## 2020-02-22 NOTE — Progress Notes (Signed)
0845 Robinul 0.1 mg IV given due large amount of secretions upon assessment.   Patient experiencing nausea and vomiting.  MD updated and Zofran 4 mg IV given, vss

## 2020-02-22 NOTE — Progress Notes (Signed)
Report given to PACU, vss 

## 2020-02-22 NOTE — Progress Notes (Signed)
Pt's states no medical or surgical changes since previsit or office visit.  CW - vitals 

## 2020-02-22 NOTE — Op Note (Signed)
[facility] Patient Name: Johnny Davenport Procedure Date: 02/22/2020 8:43 AM MRN: [id number] Endoscopist: Justice Britain , MD Age: 69 Referring MD:  Date of Birth: [birth year] Gender: Male Account #: [id number] Procedure:                Colonoscopy Indications:              Surveillance: Personal history of adenomatous                            polyps on last colonoscopy 5 years ago Medicines:                Monitored Anesthesia Care Procedure:                Pre-Anesthesia Assessment:                           - Prior to the procedure, a History and Physical                            was performed, and patient medications and                            allergies were reviewed. The patient's tolerance of                            previous anesthesia was also reviewed. The risks                            and benefits of the procedure and the sedation                            options and risks were discussed with the patient.                            All questions were answered, and informed consent                            was obtained. Prior Anticoagulants: The patient has                            taken no previous anticoagulant or antiplatelet                            agents. ASA Grade Assessment: II - A patient with                            mild systemic disease. After reviewing the risks                            and benefits, the patient was deemed in                            satisfactory condition to undergo the procedure.  After obtaining informed consent, the colonoscope                            was passed under direct vision. Throughout the                            procedure, the patient's blood pressure, pulse, and                            oxygen saturations were monitored continuously. The                            Colonoscope was introduced through the anus and                            advanced to the 5 cm into  the ileum. The                            colonoscopy was performed without difficulty. The                            patient tolerated the procedure. The quality of the                            bowel preparation was adequate. The terminal ileum,                            ileocecal valve, appendiceal orifice, and rectum                            were photographed. Scope In: 8:52:15 AM Scope Out: 9:08:57 AM Scope Withdrawal Time: 0 hours 13 minutes 12 seconds  Total Procedure Duration: 0 hours 16 minutes 42 seconds  Findings:                 The digital rectal exam findings include                            hemorrhoids. Pertinent negatives include no                            palpable rectal lesions.                           The terminal ileum and ileocecal valve appeared                            normal.                           One 10 mm mucosal nodule was found at the ileocecal                            valve. The polyp was removed with a cold snare.  Resection and retrieval were complete.                           A 2 mm polyp was found in the cecum. The polyp was                            sessile. The polyp was removed with a cold snare.                            Resection and retrieval were complete.                           A few small-mouthed diverticula were found in the                            recto-sigmoid colon and sigmoid colon.                           Normal mucosa was found in the entire colon                            otherwise.                           Non-bleeding non-thrombosed external and internal                            hemorrhoids were found during retroflexion, during                            perianal exam and during digital exam. The                            hemorrhoids were Grade II (internal hemorrhoids                            that prolapse but reduce spontaneously). Complications:            No immediate  complications. Estimated Blood Loss:     Estimated blood loss was minimal. Impression:               - Hemorrhoids found on digital rectal exam.                           - The examined portion of the ileum was normal.                           - Mucosal nodule at the ileocecal valve.                           - One 2 mm polyp in the cecum, removed with a cold                            snare. Resected and retrieved.                           -  Diverticulosis in the recto-sigmoid colon and in                            the sigmoid colon.                           - Normal mucosa in the entire examined colon                            otherwise.                           - Non-bleeding non-thrombosed external and internal                            hemorrhoids. Recommendation:           - The patient will be observed post-procedure,                            until all discharge criteria are met.                           - Discharge patient to home.                           - Patient has a contact number available for                            emergencies. The signs and symptoms of potential                            delayed complications were discussed with the                            patient. Return to normal activities tomorrow.                            Written discharge instructions were provided to the                            patient.                           - High fiber diet.                           - Use FiberCon 1-2 tablets PO daily.                           - Continue present medications.                           - Await pathology results.                           - If the ileocecal valve nodule returns adenomatous  then would recommend a 3-year colonoscopy followup.                            Otherwise, repeat colonoscopy in 7 years for                            surveillance based on pathology results and                             findings of previous adenomatous colon polyps.                           - The findings and recommendations were discussed                            with the patient.                           - The findings and recommendations were discussed                            with the patient's family. Justice Britain, MD 02/22/2020 9:17:07 AM

## 2020-02-24 ENCOUNTER — Telehealth: Payer: Self-pay

## 2020-02-24 NOTE — Telephone Encounter (Signed)
  Follow up Call-  Call back number 02/22/2020  Post procedure Call Back phone  # [phone]  Permission to leave phone message Yes  Some recent data might be hidden     Patient questions:  Do you have a fever, pain , or abdominal swelling? No. Pain Score  0 *  Have you tolerated food without any problems? Yes.    Have you been able to return to your normal activities? Yes.    Do you have any questions about your discharge instructions: Diet   No. Medications  No. Follow up visit  No.  Do you have questions or concerns about your Care? No.  Actions: * If pain score is 4 or above: No action needed, pain <4.  1. Have you developed a fever since your procedure? no  2.   Have you had an respiratory symptoms (SOB or cough) since your procedure? no  3.   Have you tested positive for COVID 19 since your procedure no  4.   Have you had any family members/close contacts diagnosed with the COVID 19 since your procedure?  no   If yes to any of these questions please route to Joylene John, RN and Joella Prince, RN

## 2020-02-25 ENCOUNTER — Encounter: Payer: Self-pay | Admitting: Family Medicine

## 2020-02-25 ENCOUNTER — Other Ambulatory Visit (INDEPENDENT_AMBULATORY_CARE_PROVIDER_SITE_OTHER): Payer: Medicare Other

## 2020-02-25 ENCOUNTER — Other Ambulatory Visit: Payer: Self-pay

## 2020-02-25 DIAGNOSIS — E785 Hyperlipidemia, unspecified: Secondary | ICD-10-CM | POA: Diagnosis not present

## 2020-02-25 LAB — LIPID PANEL
Cholesterol: 109 mg/dL (ref 0–200)
HDL: 40.3 mg/dL (ref >39.00)
LDL Cholesterol: 54 mg/dL (ref 0–99)
NonHDL: 68.79
Total CHOL/HDL Ratio: 3
Triglycerides: 72 mg/dL (ref 0.0–149.0)
VLDL: 14.4 mg/dL (ref 0.0–40.0)

## 2020-02-25 NOTE — Progress Notes (Signed)
[facility] at [facility] [address], [place], [place] [address] [phone]  Date:  03/01/2020   Name:  Johnny Davenport   DOB:  1950/06/06   MRN:  [id number]  PCP:  Darreld Mclean, MD    Chief Complaint: Hyperlipidemia (Repeat lipid panel)   History of Present Illness:  Johnny Davenport is a 69 y.o. very pleasant male patient who presents with the following:  Here today for a 6 month follow-up- he recently came in for a repeat lipid panel as we started Crestor in July.  He had a lipid panel December 17, showed significant improvement Seen by myself for a CPE in July His PSA had gone up at last visit- we made a urology referral    Lab Results  Component Value Date   PSA 4.25 (H) 10/04/2019   PSA 3.80 09/28/2018   PSA 1.41 04/03/2009    He was seen by [facility] in October of this year -Per notes, they feel he has BPH and plan to see him back in 3 months; he is following up next month  I received his recent colonoscopy report, precancerous polyps were noted, 3 year follow-up requested He notes that the prep was not that bad  He has been exercising daily- he is seeing a trainer 3x a week and also exercising at home on the other days  No CP or abnormal SOB He notes that he is feeling great and is very pleased by improvement in his lipids   Wt Readings from Last 3 Encounters:  03/01/20 178 lb (80.7 kg)  02/22/20 182 lb (82.6 kg)  02/08/20 182 lb (82.6 kg)   Flu vaccine- done  COVID-19 booster- done  Can give a dose of Prevnar 13 if desired Shingrix- this is done- 2018 tdap 07/2017  There are no problems to display for this patient.   Past Medical History:  Diagnosis Date  . Allergy    seasonal allergies  . Arthritis    bilateral thumbs  . BPH (benign prostatic hyperplasia)    on meds  . GERD (gastroesophageal reflux disease)    with certain foods/OTC meds for tx  . Hyperlipidemia    on meds    Past  Surgical History:  Procedure Laterality Date  . COLONOSCOPY  2016   TA/hems-5 yr recall  . KNEE ARTHROSCOPY W/ MENISCAL REPAIR Right    x 2  . POLYPECTOMY  2016   TA  . ROTATOR CUFF REPAIR Left   . TONSILLECTOMY    . WISDOM TOOTH EXTRACTION      Social History   Tobacco Use  . Smoking status: Never Smoker  . Smokeless tobacco: Never Used  Vaping Use  . Vaping Use: Never used  Substance Use Topics  . Alcohol use: Yes    Alcohol/week: 1.0 standard drink    Types: 1 Standard drinks or equivalent per week  . Drug use: No    Family History  Problem Relation Age of Onset  . Stroke Mother 36  . Healthy Sister   . Healthy Sister   . Colon cancer Neg Hx   . Colon polyps Neg Hx   . Esophageal cancer Neg Hx   . Stomach cancer Neg Hx   . Rectal cancer Neg Hx     No Known Allergies  Medication list has been reviewed and updated.  Current Outpatient Medications on File Prior to Visit  Medication Sig Dispense Refill  . rosuvastatin (  CRESTOR) 10 MG tablet Take 1 tablet (10 mg total) by mouth daily. 90 tablet 3  . SUMAtriptan (IMITREX) 50 MG tablet Take 1 tablet (50 mg total) by mouth every 2 (two) hours as needed for migraine. Max 100 mg in 24 hours 10 tablet 3  . tamsulosin (FLOMAX) 0.4 MG CAPS capsule Take 0.4 mg by mouth at bedtime.     No current facility-administered medications on file prior to visit.    Review of Systems:  As per HPI- otherwise negative.   Physical Examination: Vitals:   03/01/20 0916  BP: 118/60  Pulse: 64  Resp: 16  SpO2: 93%   Vitals:   03/01/20 0916  Weight: 178 lb (80.7 kg)  Height: 6' (1.829 m)   Body mass index is 24.14 kg/m. Ideal Body Weight: Weight in (lb) to have BMI = 25: 183.9  GEN: no acute distress. Normal weight, looks well  HEENT: Atraumatic, Normocephalic.  Ears and Nose: No external deformity. CV: RRR, No M/G/R. No JVD. No thrill. No extra heart sounds. PULM: CTA B, no wheezes, crackles, rhonchi. No retractions.  No resp. distress. No accessory muscle use. ABD: S, NT, ND, +BS. No rebound. No HSM. EXTR: No c/c/e PSYCH: Normally interactive. Conversant.    Assessment and Plan: Dyslipidemia  Elevated PSA  Following up today on the issues above Lipids are much better- continue current regimen.  He will continue diet and exercise efforts PSA is being managed by urology Overall doing great, followup in about 6 months  This visit occurred during the SARS-CoV-2 public health emergency.  Safety protocols were in place, including screening questions prior to the visit, additional usage of staff PPE, and extensive cleaning of exam room while observing appropriate contact time as indicated for disinfecting solutions.    Signed Lamar Blinks, MD

## 2020-02-28 ENCOUNTER — Encounter: Payer: Self-pay | Admitting: Gastroenterology

## 2020-03-01 ENCOUNTER — Encounter: Payer: Self-pay | Admitting: Family Medicine

## 2020-03-01 ENCOUNTER — Other Ambulatory Visit: Payer: Self-pay

## 2020-03-01 ENCOUNTER — Ambulatory Visit (INDEPENDENT_AMBULATORY_CARE_PROVIDER_SITE_OTHER): Payer: Medicare Other | Admitting: Family Medicine

## 2020-03-01 VITALS — BP 118/60 | HR 64 | Resp 16 | Ht 72.0 in | Wt 178.0 lb

## 2020-03-01 DIAGNOSIS — R972 Elevated prostate specific antigen [PSA]: Secondary | ICD-10-CM

## 2020-03-01 DIAGNOSIS — E785 Hyperlipidemia, unspecified: Secondary | ICD-10-CM

## 2020-03-01 NOTE — Patient Instructions (Addendum)
Can you check on your history of Prevnar 13 vaccination?  If not done already we can give this at your convenience Otherwise you are all up to day and looking terrific!   Assuming all is well please see me in 6 months and enjoy your holidays

## 2020-03-08 ENCOUNTER — Encounter: Payer: Self-pay | Admitting: Family Medicine

## 2020-03-13 ENCOUNTER — Ambulatory Visit (INDEPENDENT_AMBULATORY_CARE_PROVIDER_SITE_OTHER): Payer: Medicare Other | Admitting: Family Medicine

## 2020-03-13 ENCOUNTER — Encounter: Payer: Self-pay | Admitting: Family Medicine

## 2020-03-13 ENCOUNTER — Other Ambulatory Visit: Payer: Self-pay

## 2020-03-13 VITALS — BP 134/82 | HR 67 | Resp 17 | Ht 72.0 in | Wt 180.0 lb

## 2020-03-13 DIAGNOSIS — R350 Frequency of micturition: Secondary | ICD-10-CM

## 2020-03-13 DIAGNOSIS — R972 Elevated prostate specific antigen [PSA]: Secondary | ICD-10-CM | POA: Diagnosis not present

## 2020-03-13 DIAGNOSIS — N50811 Right testicular pain: Secondary | ICD-10-CM

## 2020-03-13 DIAGNOSIS — N451 Epididymitis: Secondary | ICD-10-CM | POA: Diagnosis not present

## 2020-03-13 LAB — POCT URINALYSIS DIP (MANUAL ENTRY)
Bilirubin, UA: NEGATIVE
Blood, UA: NEGATIVE
Glucose, UA: NEGATIVE mg/dL
Ketones, POC UA: NEGATIVE mg/dL
Leukocytes, UA: NEGATIVE
Nitrite, UA: NEGATIVE
Protein Ur, POC: NEGATIVE mg/dL
Spec Grav, UA: 1.015 (ref 1.010–1.025)
Urobilinogen, UA: 0.2 E.U./dL
pH, UA: 6 (ref 5.0–8.0)

## 2020-03-13 LAB — PSA: PSA: 4.07 ng/mL — ABNORMAL HIGH (ref 0.10–4.00)

## 2020-03-13 MED ORDER — LEVOFLOXACIN 500 MG PO TABS: 500.0000 mg | ORAL_TABLET | Freq: Every day | ORAL | 0 refills | Status: DC

## 2020-03-13 NOTE — Progress Notes (Addendum)
[facility] [address] [place], [place] [address] [phone]  Date:  03/13/2020   Name:  Johnny Davenport   DOB:  1950/08/22   MRN:  [id number]  PCP:  Pearline Cables, MD    Chief Complaint: Groin Swelling (Right lower side pain, testicular pain, urinary frequency)   History of Present Illness:  Johnny Davenport is a 70 y.o. very pleasant male patient who presents with the following:  Pt with history of hyperlipidemia, BPH Here today with concern of possible UTI or other GU issue   Lab Results  Component Value Date   PSA 4.25 (H) 10/04/2019   PSA 3.80 09/28/2018   PSA 1.41 04/03/2009   He sent me the following message on mychart I apologize for this text but just need some guidance. I visited with you on Dec. 22 and all was good. On Dec. 28 I started to feel some dull pain in my right groin and dull pain in my right testicle. There is a small lump in my groin and no lumps anywhere else. The pain got somewhat worse then subsided yesterday, only to return today but not as bad as last Thursday. The lump has gone down in size by a little, and the area feels better when I rub it. Also, my frequency of urination seems to have increased and a slight tingling is present when I urinate. Hernia or possibly UTI is my guess but what do I know.      I am scheduled for a PSA blood test on 03/21/19 with [facility] and a Doctor consultation on 03/28/19  Today he feels much better-symptoms are reduced He was having some right testicular pain but this is now 95% resolved He noted the testicular pain was coming and going.  He was not sure if there might be a bulge or a lump-in the right groin.  Difficult to know for sure  Never had a known hernia , never had hernia repair He noted urinary frequency but this is now resolved No hematuria No fever No vomiting or back pain  He does remember a similar episode a few years ago, was treated  with antibiotics.  He is not sure if he was told he had epididymitis at that time  He is also having elevated PSA, thought due to BPH, with [facility]. We can draw a PSA today, has follow-up planned with urology in about 10 days   There are no problems to display for this patient.   Past Medical History:  Diagnosis Date  . Allergy    seasonal allergies  . Arthritis    bilateral thumbs  . BPH (benign prostatic hyperplasia)    on meds  . GERD (gastroesophageal reflux disease)    with certain foods/OTC meds for tx  . Hyperlipidemia    on meds    Past Surgical History:  Procedure Laterality Date  . COLONOSCOPY  2016   TA/hems-5 yr recall  . KNEE ARTHROSCOPY W/ MENISCAL REPAIR Right    x 2  . POLYPECTOMY  2016   TA  . ROTATOR CUFF REPAIR Left   . TONSILLECTOMY    . WISDOM TOOTH EXTRACTION      Social History   Tobacco Use  . Smoking status: Never Smoker  . Smokeless tobacco: Never Used  Vaping Use  . Vaping Use: Never used  Substance Use Topics  . Alcohol use: Yes    Alcohol/week: 1.0  standard drink    Types: 1 Standard drinks or equivalent per week  . Drug use: No    Family History  Problem Relation Age of Onset  . Stroke Mother 5  . Healthy Sister   . Healthy Sister   . Colon cancer Neg Hx   . Colon polyps Neg Hx   . Esophageal cancer Neg Hx   . Stomach cancer Neg Hx   . Rectal cancer Neg Hx     No Known Allergies  Medication list has been reviewed and updated.  Current Outpatient Medications on File Prior to Visit  Medication Sig Dispense Refill  . rosuvastatin (CRESTOR) 10 MG tablet Take 1 tablet (10 mg total) by mouth daily. 90 tablet 3  . SUMAtriptan (IMITREX) 50 MG tablet Take 1 tablet (50 mg total) by mouth every 2 (two) hours as needed for migraine. Max 100 mg in 24 hours 10 tablet 3  . tamsulosin (FLOMAX) 0.4 MG CAPS capsule Take 0.4 mg by mouth at bedtime.     No current facility-administered medications on file prior to visit.     Review of Systems:  As per HPI- otherwise negative.   Physical Examination: Vitals:   03/13/20 1019  BP: 134/82  Pulse: 67  Resp: 17  SpO2: 97%   Vitals:   03/13/20 1019  Weight: 180 lb (81.6 kg)  Height: 6' (1.829 m)   Body mass index is 24.41 kg/m. Ideal Body Weight: Weight in (lb) to have BMI = 25: 183.9  GEN: no acute distress.  HEENT: Atraumatic, Normocephalic.  Ears and Nose: No external deformity. CV: RRR, No M/G/R. No JVD. No thrill. No extra heart sounds. PULM: CTA B, no wheezes, crackles, rhonchi. No retractions. No resp. distress. No accessory muscle use. ABD: S, NT, ND, +BS. No rebound. No HSM. EXTR: No c/c/e PSYCH: Normally interactive. Conversant.  There is possible slight enlargement of right-sided inguinal lymph nodes, does not feel pathologic No inguinal hernia is appreciated No penile discharge, normal circumcised penis and testes.  At this point he has no significant testicular tenderness Did not perform digital rectal exam, but there is no tenderness with pressure on the perineum  Wynonia Musty, CMA present as chaperone during exam  Results for orders placed or performed in visit on 03/13/20  POCT urinalysis dipstick  Result Value Ref Range   Color, UA yellow yellow   Clarity, UA clear clear   Glucose, UA negative negative mg/dL   Bilirubin, UA negative negative   Ketones, POC UA negative negative mg/dL   Spec Grav, UA 1.015 1.010 - 1.025   Blood, UA negative negative   pH, UA 6.0 5.0 - 8.0   Protein Ur, POC negative negative mg/dL   Urobilinogen, UA 0.2 0.2 or 1.0 E.U./dL   Nitrite, UA Negative Negative   Leukocytes, UA Negative Negative    Assessment and Plan: Epididymitis - Plan: levofloxacin (LEVAQUIN) 500 MG tablet  Urinary frequency - Plan: POCT urinalysis dipstick, Urine Culture  Testicular pain, right - Plan: POCT urinalysis dipstick, Urine Culture  Elevated PSA - Plan: PSA  Patient here today with concern of urinary  symptoms and testicular pain, the last 2 days but now much improved.  UA appears benign, urine culture is pending I counseled patient that my beta suspicion will be epididymitis.  In any case, his symptoms are resolving We will start him on Levaquin 500 for 10 days at this time Check PSA-Will plan further follow- up pending labs. He is asked to watch  for any signs or symptoms of her hernia (these are discussed) and I will be in touch with his reports ASAP He will let us know if any worsening in the meantime  This visit occurred during the SARS-CoV-2 public health emergency.  Safety protocols were in place, including screening questions prior to the visit, additional usage of staff PPE, and extensive cleaning of exam room while observing appropriate contact time as indicated for disinfecting solutions.    Signed Lamar Blinks, MD  Received his PSA as below, message to patient   Received urine culture 1/5- message to pt Results for orders placed or performed in visit on 03/13/20  Urine Culture   Specimen: Urine  Result Value Ref Range   MICRO NUMBER: [id number]    SPECIMEN QUALITY: Adequate    Sample Source URINE    STATUS: FINAL    Result: No Growth   PSA  Result Value Ref Range   PSA 4.07 (H) 0.10 - 4.00 ng/mL  POCT urinalysis dipstick  Result Value Ref Range   Color, UA yellow yellow   Clarity, UA clear clear   Glucose, UA negative negative mg/dL   Bilirubin, UA negative negative   Ketones, POC UA negative negative mg/dL   Spec Grav, UA 1.015 1.010 - 1.025   Blood, UA negative negative   pH, UA 6.0 5.0 - 8.0   Protein Ur, POC negative negative mg/dL   Urobilinogen, UA 0.2 0.2 or 1.0 E.U./dL   Nitrite, UA Negative Negative   Leukocytes, UA Negative Negative

## 2020-03-13 NOTE — Patient Instructions (Signed)
Good to see you again today- I will be in touch with your PSA and urine culture For now I think you likely have epididymitis described below.  We are going to treat you with cipro antibiotic for 10 days.  NSAIDs like aleve, ibuprofen can also help If you are getting worse again please contact me!    Epididymitis  Epididymitis is swelling (inflammation) or infection of the epididymis. The epididymis is a cord-like structure that is located along the top and back part of the testicle. It collects and stores sperm from the testicle. This condition can also cause pain and swelling of the testicle and scrotum. Symptoms usually start suddenly (acute epididymitis). Sometimes epididymitis starts gradually and lasts for a while (chronic epididymitis). This type may be harder to treat. What are the causes? In men ages 61-40, this condition is usually caused by a bacterial infection or a sexually transmitted disease (STD), such as:  Gonorrhea.  Chlamydia. In men 33 and older who do not have anal sex, this condition is usually caused by bacteria from a blockage or from abnormalities in the urinary system. These can result from:  Having a tube placed into the bladder (urinary catheter).  Having an enlarged or inflamed prostate gland.  Having recently had urinary tract surgery.  Having a problem with a backward flow of urine (retrograde). In men who have a condition that weakens the body's defense system (immune system), such as HIV, this condition can be caused by:  Other bacteria, including tuberculosis and syphilis.  Viruses.  Fungi. Sometimes this condition occurs without infection. This may happen because of trauma or repetitive activities such as sports. What increases the risk? You are more likely to develop this condition if you have:  Unprotected sex with more than one partner.  Anal sex.  Recently had surgery.  A urinary catheter.  Urinary problems.  A suppressed immune  system. What are the signs or symptoms? This condition usually begins suddenly with chills, fever, and pain behind the scrotum and in the testicle. Other symptoms include:  Swelling of the scrotum, testicle, or both.  Pain when ejaculating or urinating.  Pain in the back or abdomen.  Nausea.  Itching and discharge from the penis.  A frequent need to pass urine.  Redness, increased warmth, and tenderness of the scrotum. How is this diagnosed? Your health care provider can diagnose this condition based on your symptoms and medical history. Your health care provider will also do a physical exam to ask about your symptoms and check your scrotum and testicle for swelling, pain, and redness. You may also have other tests, including:  Examination of discharge from the penis.  Urine tests for infections, such as STDs.  Ultrasound test for blood flow and inflammation. Your health care provider may test you for other STDs, including HIV. How is this treated? Treatment for this condition depends on the cause. If your condition is caused by a bacterial infection, oral antibiotic medicine may be prescribed. If the bacterial infection has spread to your blood, you may need to receive IV antibiotics. For both bacterial and nonbacterial epididymitis, you may be treated with:  Rest.  Elevation of the scrotum.  Pain medicines.  Anti-inflammatory medicines. Surgery may be needed to treat:  Bacterial epididymitis that causes pus to build up in the scrotum (abscess).  Chronic epididymitis that has not responded to other treatments. Follow these instructions at home: Medicines  Take over-the-counter and prescription medicines only as told by your health care  provider.  If you were prescribed an antibiotic medicine, take it as told by your health care provider. Do not stop taking the antibiotic even if your condition improves. Sexual activity  If your epididymitis was caused by an STD,  avoid sexual activity until your treatment is complete.  Inform your sexual partner or partners if you test positive for an STD. They may need to be treated. Do not engage in sexual activity with your partner or partners until their treatment is completed. Managing pain and swelling   If directed, elevate your scrotum and apply ice. ? Put ice in a plastic bag. ? Place a small towel or pillow between your legs. ? Rest your scrotum on the pillow or towel. ? Place another towel between your skin and the plastic bag. ? Leave the ice on for 20 minutes, 2-3 times a day.  Try taking a sitz bath to help with discomfort. This is a warm water bath that is taken while you are sitting down. The water should only come up to your hips and should cover your buttocks. Do this 3-4 times per day or as told by your health care provider.  Keep your scrotum elevated and supported while resting. Ask your health care provider if you should wear a scrotal support, such as a jockstrap. Wear it as told by your health care provider. General instructions  Return to your normal activities as told by your health care provider. Ask your health care provider what activities are safe for you.  Drink enough fluid to keep your urine pale yellow.  Keep all follow-up visits as told by your health care provider. This is important. Contact a health care provider if:  You have a fever.  Your pain medicine is not helping.  Your pain is getting worse.  Your symptoms do not improve within 3 days. Summary  Epididymitis is swelling (inflammation) or infection of the epididymis. This condition can also cause pain and swelling of the testicle and scrotum.  Treatment for this condition depends on the cause. If your condition is caused by a bacterial infection, oral antibiotic medicine may be prescribed.  Inform your sexual partner or partners if you test positive for an STD. They may need to be treated. Do not engage in  sexual activity with your partner or partners until their treatment is completed.  Contact a health care provider if your symptoms do not improve within 3 days. This information is not intended to replace advice given to you by your health care provider. Make sure you discuss any questions you have with your health care provider. Document Revised: 12/29/2017 Document Reviewed: 12/30/2017 Elsevier Patient Education  2020 [employer].

## 2020-03-14 LAB — URINE CULTURE
MICRO NUMBER:: 11375792
Result:: NO GROWTH
SPECIMEN QUALITY:: ADEQUATE

## 2020-03-15 ENCOUNTER — Encounter: Payer: Self-pay | Admitting: Family Medicine

## 2020-06-06 ENCOUNTER — Encounter: Payer: Self-pay | Admitting: Family Medicine

## 2020-06-06 MED ORDER — METRONIDAZOLE 1 % EX GEL: Freq: Every day | CUTANEOUS | 0 refills | Status: DC

## 2020-06-29 ENCOUNTER — Encounter: Payer: Self-pay | Admitting: Family Medicine

## 2020-06-30 NOTE — Telephone Encounter (Signed)
Dr. Lorelei Pont, it looks like youre full next week-is there any space to fit him or is it ok if I push him out until the week after?

## 2020-06-30 NOTE — Progress Notes (Addendum)
Johnny Davenport at [facility] [address], [place], [place] [address] [phone]  Date:  07/03/2020   Name:  Johnny Davenport   DOB:  06-10-1950   MRN:  [id number]  PCP:  Darreld Mclean, MD    Chief Complaint: yellow and orange tounge  (Noticed it a week and half ago and has had lots of dental work last wed. )   History of Present Illness:  Johnny Davenport is a 70 y.o. very pleasant male patient who presents with the following:  Here today with concern of color change of his tongue - he contacted [place] recently with the following concern: Yesterday I noticed something odd - the underside of my tongue is somewhat yellow/orangish. My topside of my tongue feels and looks normal. I have no pumps or lesions of any kind on either side of my tongue. My health is normal and I feel good. The only health issue I have is my allergies are awful and my sinuses are stuffed because of the pollen. My gums are also somewhat swollen because of my allergies. The only other thing is that I occasionally get acid reflux and then take the medication you have given me (Omeprazole) and it goes away promptly.   Most recent labs done in July- normal liver function at that time  He has noted a yellowish color of the underside of his tongue- he first noticed this about a week- 10 days ago Otherwise he felt well- he did have a lot dental work done recently  He cracked a tooth and had a crown placed last week  No discoloration of his skin or eyes Taste is normal   There are no problems to display for this patient.   Past Medical History:  Diagnosis Date  . Allergy    seasonal allergies  . Arthritis    bilateral thumbs  . BPH (benign prostatic hyperplasia)    on meds  . GERD (gastroesophageal reflux disease)    with certain foods/OTC meds for tx  . Hyperlipidemia    on meds    Past Surgical History:  Procedure Laterality Date  . COLONOSCOPY  2016   TA/hems-5 yr  recall  . KNEE ARTHROSCOPY W/ MENISCAL REPAIR Right    x 2  . POLYPECTOMY  2016   TA  . ROTATOR CUFF REPAIR Left   . TONSILLECTOMY    . WISDOM TOOTH EXTRACTION      Social History   Tobacco Use  . Smoking status: Never Smoker  . Smokeless tobacco: Never Used  Vaping Use  . Vaping Use: Never used  Substance Use Topics  . Alcohol use: Yes    Alcohol/week: 1.0 standard drink    Types: 1 Standard drinks or equivalent per week  . Drug use: No    Family History  Problem Relation Age of Onset  . Stroke Mother 67  . Healthy Sister   . Healthy Sister   . Colon cancer Neg Hx   . Colon polyps Neg Hx   . Esophageal cancer Neg Hx   . Stomach cancer Neg Hx   . Rectal cancer Neg Hx     Allergies  Allergen Reactions  . Other Other (See Comments) and Cough    Pollen From Flowers    Medication list has been reviewed and updated.  Current Outpatient Medications on File Prior to Visit  Medication Sig Dispense Refill  . metroNIDAZOLE (METROGEL) 1 % gel Apply topically  daily. Use as needed for rosacea 30 g 0  . rosuvastatin (CRESTOR) 10 MG tablet Take 1 tablet (10 mg total) by mouth daily. 90 tablet 3  . SUMAtriptan (IMITREX) 50 MG tablet Take 1 tablet (50 mg total) by mouth every 2 (two) hours as needed for migraine. Max 100 mg in 24 hours 10 tablet 3  . tamsulosin (FLOMAX) 0.4 MG CAPS capsule Take 0.4 mg by mouth at bedtime.     No current facility-administered medications on file prior to visit.    Review of Systems:  As per HPI- otherwise negative.   Physical Examination: Vitals:   07/03/20 1528  BP: (!) 144/80  Pulse: 76  Temp: 98.8 F (37.1 C)  SpO2: 96%   Vitals:   07/03/20 1528  Weight: 176 lb 9.6 oz (80.1 kg)  Height: 6' (1.829 m)   Body mass index is 23.95 kg/m. Ideal Body Weight: Weight in (lb) to have BMI = 25: 183.9  GEN: no acute distress.  Slim build, looks well HEENT: Atraumatic, Normocephalic. Bilateral TM wnl, oropharynx normal.  PEERL,EOMI.    There is some mild peach coloration of the underside of his tongue, but nothing that I would call abnormal No other evidence of jaundice Ears and Nose: No external deformity. CV: RRR, No M/G/R. No JVD. No thrill. No extra heart sounds. PULM: CTA B, no wheezes, crackles, rhonchi. No retractions. No resp. distress. No accessory muscle use. ABD: S, NT, ND. No rebound. No HSM. EXTR: No c/c/e PSYCH: Normally interactive. Conversant.    Assessment and Plan: Tongue abnormality - Plan: Comprehensive metabolic panel  Patient has noticed a concern about yellowish discoloration of tongue On exam tongue appears relatively normal.  However, we will certainly check liver function test to ensure no evidence of jaundice Will plan further follow- up pending labs.   This visit occurred during the SARS-CoV-2 public health emergency.  Safety protocols were in place, including screening questions prior to the visit, additional usage of staff PPE, and extensive cleaning of exam room while observing appropriate contact time as indicated for disinfecting solutions.    Signed Lamar Blinks, MD Received his labs 4/26- message to pt  Results for orders placed or performed in visit on 07/03/20  Comprehensive metabolic panel  Result Value Ref Range   Sodium 140 135 - 145 mEq/L   Potassium 4.1 3.5 - 5.1 mEq/L   Chloride 103 96 - 112 mEq/L   CO2 31 19 - 32 mEq/L   Glucose, Bld 96 70 - 99 mg/dL   BUN 21 6 - 23 mg/dL   Creatinine, Ser 1.13 0.40 - 1.50 mg/dL   Total Bilirubin 0.5 0.2 - 1.2 mg/dL   Alkaline Phosphatase 62 39 - 117 U/L   AST 23 0 - 37 U/L   ALT 30 0 - 53 U/L   Total Protein 6.5 6.0 - 8.3 g/dL   Albumin 4.2 3.5 - 5.2 g/dL   GFR 65.36 >60.00 mL/min   Calcium 9.2 8.4 - 10.5 mg/dL

## 2020-07-03 ENCOUNTER — Encounter: Payer: Self-pay | Admitting: Family Medicine

## 2020-07-03 ENCOUNTER — Ambulatory Visit (INDEPENDENT_AMBULATORY_CARE_PROVIDER_SITE_OTHER): Payer: Medicare Other | Admitting: Family Medicine

## 2020-07-03 ENCOUNTER — Other Ambulatory Visit: Payer: Self-pay

## 2020-07-03 VITALS — BP 144/80 | HR 76 | Temp 98.8°F | Ht 72.0 in | Wt 176.6 lb

## 2020-07-03 DIAGNOSIS — Q383 Other congenital malformations of tongue: Secondary | ICD-10-CM

## 2020-07-03 NOTE — Patient Instructions (Signed)
It was good to see you today-  I will be in touch with your liver function tests to ensure all is well Assuming no issues here I don't think your tongue color is cause for alarm, but let me know if changing or getting worse at all

## 2020-07-04 ENCOUNTER — Encounter: Payer: Self-pay | Admitting: Family Medicine

## 2020-07-04 LAB — COMPREHENSIVE METABOLIC PANEL
ALT: 30 U/L (ref 0–53)
AST: 23 U/L (ref 0–37)
Albumin: 4.2 g/dL (ref 3.5–5.2)
Alkaline Phosphatase: 62 U/L (ref 39–117)
BUN: 21 mg/dL (ref 6–23)
CO2: 31 mEq/L (ref 19–32)
Calcium: 9.2 mg/dL (ref 8.4–10.5)
Chloride: 103 mEq/L (ref 96–112)
Creatinine, Ser: 1.13 mg/dL (ref 0.40–1.50)
GFR: 65.36 mL/min (ref >60.00)
Glucose, Bld: 96 mg/dL (ref 70–99)
Potassium: 4.1 mEq/L (ref 3.5–5.1)
Sodium: 140 mEq/L (ref 135–145)
Total Bilirubin: 0.5 mg/dL (ref 0.2–1.2)
Total Protein: 6.5 g/dL (ref 6.0–8.3)

## 2020-08-04 ENCOUNTER — Encounter: Payer: Self-pay | Admitting: Family Medicine

## 2020-08-04 DIAGNOSIS — G43009 Migraine without aura, not intractable, without status migrainosus: Secondary | ICD-10-CM

## 2020-08-04 MED ORDER — SUMATRIPTAN SUCCINATE 50 MG PO TABS
50.0000 mg | ORAL_TABLET | ORAL | 3 refills | Status: DC | PRN
Start: 2020-08-04 — End: 2021-05-28

## 2020-08-22 ENCOUNTER — Encounter: Payer: Self-pay | Admitting: Family Medicine

## 2020-08-22 DIAGNOSIS — U071 COVID-19: Secondary | ICD-10-CM

## 2020-08-22 DIAGNOSIS — E785 Hyperlipidemia, unspecified: Secondary | ICD-10-CM

## 2020-08-22 MED ORDER — NIRMATRELVIR/RITONAVIR (PAXLOVID)TABLET: 3.0000 | ORAL_TABLET | Freq: Two times a day (BID) | ORAL | 0 refills | Status: AC

## 2020-09-01 NOTE — Progress Notes (Signed)
Subjective:   Johnny Davenport is a 70 y.o. male who presents for Medicare Annual/Subsequent preventive examination.  I connected with Gerber today by telephone and verified that I am speaking with the correct person using two identifiers. Location patient: home Location provider: work Persons participating in the virtual visit: patient, Marine scientist.    I discussed the limitations, risks, security and privacy concerns of performing an evaluation and management service by telephone and the availability of in person appointments. I also discussed with the patient that there may be a patient responsible charge related to this service. The patient expressed understanding and verbally consented to this telephonic visit.    Interactive audio and video telecommunications were attempted between this provider and patient, however failed, due to patient having technical difficulties OR patient did not have access to video capability.  We continued and completed visit with audio only.  Some vital signs may be absent or patient reported.   Time Spent with patient on telephone encounter: 20 minutes   Review of Systems     Cardiac Risk Factors include: advanced age (>29men, >67 women);male gender;dyslipidemia     Objective:    Today's Vitals   09/04/20 1337 09/04/20 1338  Weight: 176 lb (79.8 kg)   Height: 6' (1.829 m)   PainSc:  1    Body mass index is 23.87 kg/m.  Advanced Directives 09/04/2020 08/03/2019 01/05/2016  Does Patient Have a Medical Advance Directive? Yes No No  Type of Paramedic of [place];Living will - -  Copy of [place] in Chart? No - copy requested - -  Would patient like information on creating a medical advance directive? - No - Patient declined No - patient declined information    Current Medications (verified) Outpatient Encounter Medications as of 09/04/2020  Medication Sig   metroNIDAZOLE (METROGEL) 1 % gel Apply topically  daily. Use as needed for rosacea   omeprazole (PRILOSEC) 20 MG capsule Take 20 mg by mouth daily as needed.   rosuvastatin (CRESTOR) 10 MG tablet Take 1 tablet (10 mg total) by mouth daily.   SUMAtriptan (IMITREX) 50 MG tablet Take 1 tablet (50 mg total) by mouth every 2 (two) hours as needed for migraine. Max 100 mg in 24 hours   tamsulosin (FLOMAX) 0.4 MG CAPS capsule Take 0.4 mg by mouth at bedtime.   No facility-administered encounter medications on file as of 09/04/2020.    Allergies (verified) Other   History: Past Medical History:  Diagnosis Date   Allergy    seasonal allergies   Arthritis    bilateral thumbs   BPH (benign prostatic hyperplasia)    on meds   GERD (gastroesophageal reflux disease)    with certain foods/OTC meds for tx   Hyperlipidemia    on meds   Past Surgical History:  Procedure Laterality Date   COLONOSCOPY  2016   TA/hems-5 yr recall   KNEE ARTHROSCOPY W/ MENISCAL REPAIR Right    x 2   POLYPECTOMY  2016   TA   ROTATOR CUFF REPAIR Left    TONSILLECTOMY     WISDOM TOOTH EXTRACTION     Family History  Problem Relation Age of Onset   Stroke Mother 47   Healthy Sister    Healthy Sister    Colon cancer Neg Hx    Colon polyps Neg Hx    Esophageal cancer Neg Hx    Stomach cancer Neg Hx    Rectal cancer Neg Hx  Social History   Socioeconomic History   Marital status: Married    Spouse name: Not on file   Number of children: Not on file   Years of education: Not on file   Highest education level: Not on file  Occupational History   Not on file  Tobacco Use   Smoking status: Never   Smokeless tobacco: Never  Vaping Use   Vaping Use: Never used  Substance and Sexual Activity   Alcohol use: Yes    Alcohol/week: 1.0 standard drink    Types: 1 Standard drinks or equivalent per week   Drug use: No   Sexual activity: Not on file  Other Topics Concern   Not on file  Social History Narrative   Not on file   Social Determinants of  Health   Financial Resource Strain: Low Risk    Difficulty of Paying Living Expenses: Not hard at all  Food Insecurity: No Food Insecurity   Worried About Charity fundraiser in the Last Year: Never true   [place] in the Last Year: Never true  Transportation Needs: No Transportation Needs   Lack of Transportation (Medical): No   Lack of Transportation (Non-Medical): No  Physical Activity: Sufficiently Active   Days of Exercise per Week: 5 days   Minutes of Exercise per Session: 30 min  Stress: No Stress Concern Present   Feeling of Stress : Not at all  Social Connections: Moderately Integrated   Frequency of Communication with Friends and Family: More than three times a week   Frequency of Social Gatherings with Friends and Family: More than three times a week   Attends Religious Services: Never   Marine scientist or Organizations: Yes   Attends Music therapist: More than 4 times per year   Marital Status: Married    Tobacco Counseling Counseling given: Not Answered   Clinical Intake:  Pre-visit preparation completed: Yes  Pain : 0-10 Pain Score: 1  Pain Type: Acute pain Pain Location: Teeth Pain Onset: 1 to 4 weeks ago Pain Frequency: Intermittent     Nutritional Status: BMI of 19-24  Normal Nutritional Risks: None Diabetes: No  How often do you need to have someone help you when you read instructions, pamphlets, or other written materials from your doctor or pharmacy?: 1 - Never  Diabetic?No  Interpreter Needed?: No  Information entered by :: Caroleen Hamman LPN   Activities of Daily Living In your present state of health, do you have any difficulty performing the following activities: 09/04/2020  Hearing? N  Vision? N  Difficulty concentrating or making decisions? N  Walking or climbing stairs? N  Dressing or bathing? N  Doing errands, shopping? N  Preparing Food and eating ? N  Using the Toilet? N  In the past six months,  have you accidently leaked urine? N  Do you have problems with loss of bowel control? N  Managing your Medications? N  Managing your Finances? N  Housekeeping or managing your Housekeeping? N  Some recent data might be hidden    Patient Care Team: Copland, Gay Filler, MD as PCP - General (Family Medicine)  Indicate any recent Medical Services you may have received from other than [facility] providers in the past year (date may be approximate).     Assessment:   This is a routine wellness examination for [place].  Hearing/Vision screen Hearing Screening - Comments:: C/o mild hearing loss Vision Screening - Comments:: Wears glasses Last  eye exam-07/2020-[facility]  Dietary issues and exercise activities discussed: Current Exercise Habits: Home exercise routine, Type of exercise: strength training/weights;treadmill;Other - see comments (golf), Time (Minutes): 30, Frequency (Times/Week): 5, Weekly Exercise (Minutes/Week): 150, Intensity: Mild, Exercise limited by: None identified   Goals Addressed             This Visit's Progress    Maintain healthy active lifestyle.   On track      Depression Screen PHQ 2/9 Scores 09/04/2020 08/03/2019  PHQ - 2 Score 0 0    Fall Risk Fall Risk  09/04/2020 08/03/2019  Falls in the past year? 0 0  Number falls in past yr: 0 0  Injury with Fall? 0 0  Follow up Falls prevention discussed Education provided;Falls prevention discussed    FALL RISK PREVENTION PERTAINING TO THE HOME:  Any stairs in or around the home? Yes  If so, are there any without handrails? No  Home free of loose throw rugs in walkways, pet beds, electrical cords, etc? Yes  Adequate lighting in your home to reduce risk of falls? Yes   ASSISTIVE DEVICES UTILIZED TO PREVENT FALLS:  Life alert? No  Use of a cane, walker or w/c? No  Grab bars in the bathroom? No  Shower chair or bench in shower? No  Elevated toilet seat or a handicapped toilet? No   TIMED UP AND  GO:  Was the test performed? No . Phone visit   Cognitive Function:Normal cognitive status assessed by  this Nurse Health Advisor. No abnormalities found.          Immunizations Immunization History  Administered Date(s) Administered   Influenza-Unspecified 12/10/2019   PFIZER(Purple Top)SARS-COV-2 Vaccination 04/02/2019, 04/22/2019, 12/24/2019   Pneumococcal Conjugate-13 11/10/2018   Pneumococcal Polysaccharide-23 12/10/2018   Tdap 07/09/2017    TDAP status: Up to date  Flu Vaccine status: Up to date  Pneumococcal vaccine status: Up to date  Covid-19 vaccine status: Completed vaccines  Qualifies for Shingles Vaccine? Yes   Zostavax completed No   Shingrix Completed?: No.    Education has been provided regarding the importance of this vaccine. Patient has been advised to call insurance company to determine out of pocket expense if they have not yet received this vaccine. Advised may also receive vaccine at local pharmacy or Health Dept. Verbalized acceptance and understanding.  Screening Tests Health Maintenance  Topic Date Due   Zoster Vaccines- Shingrix (1 of 2) Never done   COVID-19 Vaccine (4 - Booster for Pfizer series) 04/25/2020   INFLUENZA VACCINE  10/09/2020   COLONOSCOPY (Pts 45-75yrs Insurance coverage will need to be confirmed)  02/22/2023   TETANUS/TDAP  07/10/2027   Hepatitis C Screening  Completed   PNA vac Low Risk Adult  Completed   HPV VACCINES  Aged Out    Health Maintenance  Health Maintenance Due  Topic Date Due   Zoster Vaccines- Shingrix (1 of 2) Never done   COVID-19 Vaccine (4 - Booster for Pfizer series) 04/25/2020    Colorectal cancer screening: Type of screening: Colonoscopy. Completed 02/22/2020. Repeat every 3 years  Lung Cancer Screening: (Low Dose CT Chest recommended if Age 67-80 years, 30 pack-year currently smoking OR have quit w/in 15years.) does not qualify.     Additional Screening:  Hepatitis C Screening: Completed  10/04/2019  Vision Screening: Recommended annual ophthalmology exams for early detection of glaucoma and other disorders of the eye. Is the patient up to date with their annual eye exam?  Yes  Who is the provider or what is the name of the office in which the patient attends annual eye exams? [facility]   Dental Screening: Recommended annual dental exams for proper oral hygiene  Community Resource Referral / Chronic Care Management: CRR required this visit?  No   CCM required this visit?  No      Plan:     I have personally reviewed and noted the following in the patient's chart:   Medical and social history Use of alcohol, tobacco or illicit drugs  Current medications and supplements including opioid prescriptions. Patient is not currently taking opioid prescriptions. Functional ability and status Nutritional status Physical activity Advanced directives List of other physicians Hospitalizations, surgeries, and ER visits in previous 12 months Vitals Screenings to include cognitive, depression, and falls Referrals and appointments  In addition, I have reviewed and discussed with patient certain preventive protocols, quality metrics, and best practice recommendations. A written personalized care plan for preventive services as well as general preventive health recommendations were provided to patient.   Due to this being a telephonic visit, the after visit summary with patients personalized plan was offered to patient via mail or my-chart. Patient would like to access on my-chart.   Marta Antu, LPN   [id number]  Nurse Health Advisor  Nurse Notes: None

## 2020-09-04 ENCOUNTER — Ambulatory Visit (INDEPENDENT_AMBULATORY_CARE_PROVIDER_SITE_OTHER): Payer: Medicare Other

## 2020-09-04 VITALS — Ht 72.0 in | Wt 176.0 lb

## 2020-09-04 DIAGNOSIS — Z0000 Encounter for general adult medical examination without abnormal findings: Secondary | ICD-10-CM

## 2020-09-04 NOTE — Patient Instructions (Signed)
Johnny Davenport , Thank you for taking time to complete your Medicare Wellness Visit. I appreciate your ongoing commitment to your health goals. Please review the following plan we discussed and let me know if I can assist you in the future.   Screening recommendations/referrals: Colonoscopy: Completed 02/22/2020-Due- 02/22/2023 Recommended yearly ophthalmology/optometry visit for glaucoma screening and checkup Recommended yearly dental visit for hygiene and checkup  Vaccinations: Influenza vaccine: Up to date Pneumococcal vaccine: Up to date Tdap vaccine: Up to date-Due 07/10/2027 Shingles vaccine: Completed vaccines-Specific dates unknown   Covid-19: Up to date  Advanced directives: Please bring a copy for your chart  Conditions/risks identified: See problem list  Next appointment: Follow up in one year for your annual wellness visit.   Preventive Care 50 Years and Older, Male Preventive care refers to lifestyle choices and visits with your health care provider that can promote health and wellness. What does preventive care include? A yearly physical exam. This is also called an annual well check. Dental exams once or twice a year. Routine eye exams. Ask your health care provider how often you should have your eyes checked. Personal lifestyle choices, including: Daily care of your teeth and gums. Regular physical activity. Eating a healthy diet. Avoiding tobacco and drug use. Limiting alcohol use. Practicing safe sex. Taking low doses of aspirin every day. Taking vitamin and mineral supplements as recommended by your health care provider. What happens during an annual well check? The services and screenings done by your health care provider during your annual well check will depend on your age, overall health, lifestyle risk factors, and family history of disease. Counseling  Your health care provider may ask you questions about your: Alcohol use. Tobacco use. Drug use. Emotional  well-being. Home and relationship well-being. Sexual activity. Eating habits. History of falls. Memory and ability to understand (cognition). Work and work Statistician. Screening  You may have the following tests or measurements: Height, weight, and BMI. Blood pressure. Lipid and cholesterol levels. These may be checked every 5 years, or more frequently if you are over 63 years old. Skin check. Lung cancer screening. You may have this screening every year starting at age 77 if you have a 30-pack-year history of smoking and currently smoke or have quit within the past 15 years. Fecal occult blood test (FOBT) of the stool. You may have this test every year starting at age 15. Flexible sigmoidoscopy or colonoscopy. You may have a sigmoidoscopy every 5 years or a colonoscopy every 10 years starting at age 68. Prostate cancer screening. Recommendations will vary depending on your family history and other risks. Hepatitis C blood test. Hepatitis B blood test. Sexually transmitted disease (STD) testing. Diabetes screening. This is done by checking your blood sugar (glucose) after you have not eaten for a while (fasting). You may have this done every 1-3 years. Abdominal aortic aneurysm (AAA) screening. You may need this if you are a current or former smoker. Osteoporosis. You may be screened starting at age 12 if you are at high risk. Talk with your health care provider about your test results, treatment options, and if necessary, the need for more tests. Vaccines  Your health care provider may recommend certain vaccines, such as: Influenza vaccine. This is recommended every year. Tetanus, diphtheria, and acellular pertussis (Tdap, Td) vaccine. You may need a Td booster every 10 years. Zoster vaccine. You may need this after age 24. Pneumococcal 13-valent conjugate (PCV13) vaccine. One dose is recommended after age 67. Pneumococcal polysaccharide (  PPSV23) vaccine. One dose is recommended after  age 16. Talk to your health care provider about which screenings and vaccines you need and how often you need them. This information is not intended to replace advice given to you by your health care provider. Make sure you discuss any questions you have with your health care provider. Document Released: 03/24/2015 Document Revised: 11/15/2015 Document Reviewed: 12/27/2014 Elsevier Interactive Patient Education  2017 [place] Prevention in the Home Falls can cause injuries. They can happen to people of all ages. There are many things you can do to make your home safe and to help prevent falls. What can I do on the outside of my home? Regularly fix the edges of walkways and driveways and fix any cracks. Remove anything that might make you trip as you walk through a door, such as a raised step or threshold. Trim any bushes or trees on the path to your home. Use bright outdoor lighting. Clear any walking paths of anything that might make someone trip, such as rocks or tools. Regularly check to see if handrails are loose or broken. Make sure that both sides of any steps have handrails. Any raised decks and porches should have guardrails on the edges. Have any leaves, snow, or ice cleared regularly. Use sand or salt on walking paths during winter. Clean up any spills in your garage right away. This includes oil or grease spills. What can I do in the bathroom? Use night lights. Install grab bars by the toilet and in the tub and shower. Do not use towel bars as grab bars. Use non-skid mats or decals in the tub or shower. If you need to sit down in the shower, use a plastic, non-slip stool. Keep the floor dry. Clean up any water that spills on the floor as soon as it happens. Remove soap buildup in the tub or shower regularly. Attach bath mats securely with double-sided non-slip rug tape. Do not have throw rugs and other things on the floor that can make you trip. What can I do in the  bedroom? Use night lights. Make sure that you have a light by your bed that is easy to reach. Do not use any sheets or blankets that are too big for your bed. They should not hang down onto the floor. Have a firm chair that has side arms. You can use this for support while you get dressed. Do not have throw rugs and other things on the floor that can make you trip. What can I do in the kitchen? Clean up any spills right away. Avoid walking on wet floors. Keep items that you use a lot in easy-to-reach places. If you need to reach something above you, use a strong step stool that has a grab bar. Keep electrical cords out of the way. Do not use floor polish or wax that makes floors slippery. If you must use wax, use non-skid floor wax. Do not have throw rugs and other things on the floor that can make you trip. What can I do with my stairs? Do not leave any items on the stairs. Make sure that there are handrails on both sides of the stairs and use them. Fix handrails that are broken or loose. Make sure that handrails are as long as the stairways. Check any carpeting to make sure that it is firmly attached to the stairs. Fix any carpet that is loose or worn. Avoid having throw rugs at the top or  bottom of the stairs. If you do have throw rugs, attach them to the floor with carpet tape. Make sure that you have a light switch at the top of the stairs and the bottom of the stairs. If you do not have them, ask someone to add them for you. What else can I do to help prevent falls? Wear shoes that: Do not have high heels. Have rubber bottoms. Are comfortable and fit you well. Are closed at the toe. Do not wear sandals. If you use a stepladder: Make sure that it is fully opened. Do not climb a closed stepladder. Make sure that both sides of the stepladder are locked into place. Ask someone to hold it for you, if possible. Clearly mark and make sure that you can see: Any grab bars or  handrails. First and last steps. Where the edge of each step is. Use tools that help you move around (mobility aids) if they are needed. These include: Canes. Walkers. Scooters. Crutches. Turn on the lights when you go into a dark area. Replace any light bulbs as soon as they burn out. Set up your furniture so you have a clear path. Avoid moving your furniture around. If any of your floors are uneven, fix them. If there are any pets around you, be aware of where they are. Review your medicines with your doctor. Some medicines can make you feel dizzy. This can increase your chance of falling. Ask your doctor what other things that you can do to help prevent falls. This information is not intended to replace advice given to you by your health care provider. Make sure you discuss any questions you have with your health care provider. Document Released: 12/22/2008 Document Revised: 08/03/2015 Document Reviewed: 04/01/2014 Elsevier Interactive Patient Education  2017 [employer].

## 2020-09-12 MED ORDER — ROSUVASTATIN CALCIUM 10 MG PO TABS: 10.0000 mg | ORAL_TABLET | Freq: Every day | ORAL | 3 refills | Status: DC

## 2020-09-12 NOTE — Addendum Note (Signed)
Addended by: Lamar Blinks C on: 09/12/2020 01:54 PM   Modules accepted: Orders

## 2020-09-30 DIAGNOSIS — E785 Hyperlipidemia, unspecified: Secondary | ICD-10-CM | POA: Insufficient documentation

## 2020-09-30 NOTE — Patient Instructions (Addendum)
Good to see you again today, I will be in touch with your lab results  Consider getting another covid booster this fall

## 2020-09-30 NOTE — Progress Notes (Addendum)
[facility] [place], [place], [place] [address] [id number] [phone]  Date:  10/05/2020   Name:  Johnny Davenport   DOB:  [birth year]   MRN:  [id number]  PCP:  Darreld Mclean, MD    Chief Complaint: Annual Exam (cpe)   History of Present Illness:  Johnny Davenport is a 70 y.o. very pleasant male patient who presents with the following:  Pt seen today for CPE/ medicare  Last visit with myself was in April with concern of a change in his tongue color - labs at that time were normal Otherwise history of allergies, BPH, gerd, hyperlipidemia  He did have covid last month and used paxlovid- it tasted terrible He feels like he has recovered fully, no more cough   Patient Active Problem List   Diagnosis Date Noted   Dyslipidemia 09/30/2020   Shingles vaccine- done in 2018 4th dose covid Colon UTD His PSA went over 4 last July, still over 4 in January although reduced  I had him see urology- visit in February  They planned to repeat PSA in 6 months  Married to [employer]  They have a 88 month old grandson who is adored   [employer] fast streamer  He does have a home BP cuff- typically 115/70 He is getting plenty of exercise- he goes to the gym, plays a lot of golf at [employer]   No CP or SOB Admits he is a bit nervous about his PSA  Lab Results  Component Value Date   PSA 4.64 (H) 10/05/2020   PSA 4.07 (H) 03/13/2020   PSA 4.25 (H) 10/04/2019    Take care, let me know what the urologist has to say  BP Readings from Last 3 Encounters:  10/05/20 130/85  07/03/20 (!) 144/80  03/13/20 134/82    Past Medical History:  Diagnosis Date   Allergy    seasonal allergies   Arthritis    bilateral thumbs   BPH (benign prostatic hyperplasia)    on meds   GERD (gastroesophageal reflux disease)    with certain foods/OTC meds for tx   Hyperlipidemia    on meds    Past Surgical History:  Procedure Laterality Date   COLONOSCOPY   2016   TA/hems-5 yr recall   KNEE ARTHROSCOPY W/ MENISCAL REPAIR Right    x 2   POLYPECTOMY  2016   TA   ROTATOR CUFF REPAIR Left    TONSILLECTOMY     WISDOM TOOTH EXTRACTION      Social History   Tobacco Use   Smoking status: Never   Smokeless tobacco: Never  Vaping Use   Vaping Use: Never used  Substance Use Topics   Alcohol use: Yes    Alcohol/week: 1.0 standard drink    Types: 1 Standard drinks or equivalent per week   Drug use: No    Family History  Problem Relation Age of Onset   Stroke Mother 15   Healthy Sister    Healthy Sister    Colon cancer Neg Hx    Colon polyps Neg Hx    Esophageal cancer Neg Hx    Stomach cancer Neg Hx    Rectal cancer Neg Hx     Allergies  Allergen Reactions   Other Other (See Comments) and Cough    Pollen From Flowers    Medication list has been reviewed and updated.  Current Outpatient Medications on File Prior to Visit  Medication  Sig Dispense Refill   metroNIDAZOLE (METROGEL) 1 % gel Apply topically daily. Use as needed for rosacea 30 g 0   omeprazole (PRILOSEC) 20 MG capsule Take 20 mg by mouth daily as needed.     rosuvastatin (CRESTOR) 10 MG tablet Take 1 tablet (10 mg total) by mouth daily. 90 tablet 3   SUMAtriptan (IMITREX) 50 MG tablet Take 1 tablet (50 mg total) by mouth every 2 (two) hours as needed for migraine. Max 100 mg in 24 hours 10 tablet 3   tamsulosin (FLOMAX) 0.4 MG CAPS capsule Take 0.4 mg by mouth at bedtime.     No current facility-administered medications on file prior to visit.    Review of Systems:  As per HPI- otherwise negative.   Physical Examination: Vitals:   10/05/20 0815 10/05/20 0832  BP: (!) 144/90 130/85  Pulse: 74   Temp: 97.9 F (36.6 C)   SpO2: 98%    Vitals:   10/05/20 0815  Weight: 177 lb 6.4 oz (80.5 kg)  Height: 6' (1.829 m)   Body mass index is 24.06 kg/m. Ideal Body Weight: Weight in (lb) to have BMI = 25: 183.9  GEN: no acute distress.  Normal weight, looks  well  HEENT: Atraumatic, Normocephalic.  Bilateral TM wnl, oropharynx normal.  PEERL,EOMI.   Ears and Nose: No external deformity. CV: RRR, No M/G/R. No JVD. No thrill. No extra heart sounds. PULM: CTA B, no wheezes, crackles, rhonchi. No retractions. No resp. distress. No accessory muscle use. ABD: S, NT, ND. No rebound. No HSM. EXTR: No c/c/e PSYCH: Normally interactive. Conversant.    Assessment and Plan: PSA elevation - Plan: PSA  Dyslipidemia - Plan: Lipid panel  Screening for deficiency anemia - Plan: CBC  Medication monitoring encounter - Plan: CBC  Elevated BP without diagnosis of hypertension  Encouraged healthy diet and exercise routine Will plan further follow- up pending labs. Follow-up on PSA today Pt notes BP is always ok at home- he will continue to monitor on a regular basis   This visit occurred during the SARS-CoV-2 public health emergency.  Safety protocols were in place, including screening questions prior to the visit, additional usage of staff PPE, and extensive cleaning of exam room while observing appropriate contact time as indicated for disinfecting solutions.   Signed Lamar Blinks, MD  Received his labs as below, also he came back with a question about his hand after our visit.  Message sent to pt   Pt came back to the back of the clinic stating he forgot to discuss his hand pain with Dr. Lorelei Pont. Pt advised that Dr. Lorelei Pont was in another room. Pt states having right hand pain and states he thinks it may be arthritis. Pt wanted to know what he could take for pain.   Results for orders placed or performed in visit on 10/05/20  PSA  Result Value Ref Range   PSA 4.64 (H) 0.10 - 4.00 ng/mL  Lipid panel  Result Value Ref Range   Cholesterol 117 0 - 200 mg/dL   Triglycerides 60.0 0.0 - 149.0 mg/dL   HDL 51.30 >39.00 mg/dL   VLDL 12.0 0.0 - 40.0 mg/dL   LDL Cholesterol 54 0 - 99 mg/dL   Total CHOL/HDL Ratio 2    NonHDL 66.10   CBC  Result Value  Ref Range   WBC 4.1 4.0 - 10.5 K/uL   RBC 4.13 (L) 4.22 - 5.81 Mil/uL   Platelets 170.0 150.0 - 400.0 K/uL  Hemoglobin 13.5 13.0 - 17.0 g/dL   HCT 39.9 39.0 - 52.0 %   MCV 96.5 78.0 - 100.0 fl   MCHC 33.7 30.0 - 36.0 g/dL   RDW 13.0 11.5 - 15.5 %

## 2020-10-05 ENCOUNTER — Encounter: Payer: Self-pay | Admitting: Family Medicine

## 2020-10-05 ENCOUNTER — Telehealth: Payer: Self-pay

## 2020-10-05 ENCOUNTER — Ambulatory Visit (INDEPENDENT_AMBULATORY_CARE_PROVIDER_SITE_OTHER): Payer: Medicare Other | Admitting: Family Medicine

## 2020-10-05 ENCOUNTER — Other Ambulatory Visit: Payer: Self-pay

## 2020-10-05 VITALS — BP 130/85 | HR 74 | Temp 97.9°F | Ht 72.0 in | Wt 177.4 lb

## 2020-10-05 DIAGNOSIS — R972 Elevated prostate specific antigen [PSA]: Secondary | ICD-10-CM

## 2020-10-05 DIAGNOSIS — R030 Elevated blood-pressure reading, without diagnosis of hypertension: Secondary | ICD-10-CM

## 2020-10-05 DIAGNOSIS — Z130 Encounter for screening for diseases of the blood and blood-forming organs and certain disorders involving the immune mechanism: Secondary | ICD-10-CM | POA: Diagnosis not present

## 2020-10-05 DIAGNOSIS — E785 Hyperlipidemia, unspecified: Secondary | ICD-10-CM

## 2020-10-05 DIAGNOSIS — Z5181 Encounter for therapeutic drug level monitoring: Secondary | ICD-10-CM

## 2020-10-05 LAB — CBC
HCT: 39.9 % (ref 39.0–52.0)
Hemoglobin: 13.5 g/dL (ref 13.0–17.0)
MCHC: 33.7 g/dL (ref 30.0–36.0)
MCV: 96.5 fl (ref 78.0–100.0)
Platelets: 170 10*3/uL (ref 150.0–400.0)
RBC: 4.13 Mil/uL — ABNORMAL LOW (ref 4.22–5.81)
RDW: 13 % (ref 11.5–15.5)
WBC: 4.1 10*3/uL (ref 4.0–10.5)

## 2020-10-05 LAB — LIPID PANEL
Cholesterol: 117 mg/dL (ref 0–200)
HDL: 51.3 mg/dL (ref >39.00)
LDL Cholesterol: 54 mg/dL (ref 0–99)
NonHDL: 66.1
Total CHOL/HDL Ratio: 2
Triglycerides: 60 mg/dL (ref 0.0–149.0)
VLDL: 12 mg/dL (ref 0.0–40.0)

## 2020-10-05 LAB — PSA: PSA: 4.64 ng/mL — ABNORMAL HIGH (ref 0.10–4.00)

## 2020-10-05 NOTE — Telephone Encounter (Signed)
Pt came back to the back of the clinic stating he forgot to discuss his hand pain with Dr. Lorelei Pont. Pt advised that Dr. Lorelei Pont was in another room. Pt states having right hand pain and states he thinks it may be arthritis. Pt wanted to know what he could take for pain.

## 2020-10-09 ENCOUNTER — Encounter: Payer: Self-pay | Admitting: Family Medicine

## 2020-11-07 ENCOUNTER — Encounter: Payer: Self-pay | Admitting: Family Medicine

## 2020-11-07 DIAGNOSIS — M25529 Pain in unspecified elbow: Secondary | ICD-10-CM

## 2020-11-07 NOTE — Progress Notes (Deleted)
[facility] [address], [place], [place] [address] [id number] [phone]  Date:  11/08/2020   Name:  Johnny Davenport   DOB:  05-Nov-1950   MRN:  [id number]  PCP:  Darreld Mclean, MD    Chief Complaint: No chief complaint on file.   History of Present Illness:  Akif Wojno is a 70 y.o. very pleasant male patient who presents with the following:  Pt seen today with concern of tennis elbow  Patient Active Problem List   Diagnosis Date Noted   Dyslipidemia 09/30/2020    Past Medical History:  Diagnosis Date   Allergy    seasonal allergies   Arthritis    bilateral thumbs   BPH (benign prostatic hyperplasia)    on meds   GERD (gastroesophageal reflux disease)    with certain foods/OTC meds for tx   Hyperlipidemia    on meds    Past Surgical History:  Procedure Laterality Date   COLONOSCOPY  2016   TA/hems-5 yr recall   KNEE ARTHROSCOPY W/ MENISCAL REPAIR Right    x 2   POLYPECTOMY  2016   TA   ROTATOR CUFF REPAIR Left    TONSILLECTOMY     WISDOM TOOTH EXTRACTION      Social History   Tobacco Use   Smoking status: Never   Smokeless tobacco: Never  Vaping Use   Vaping Use: Never used  Substance Use Topics   Alcohol use: Yes    Alcohol/week: 1.0 standard drink    Types: 1 Standard drinks or equivalent per week   Drug use: No    Family History  Problem Relation Age of Onset   Stroke Mother 84   Healthy Sister    Healthy Sister    Colon cancer Neg Hx    Colon polyps Neg Hx    Esophageal cancer Neg Hx    Stomach cancer Neg Hx    Rectal cancer Neg Hx     Allergies  Allergen Reactions   Other Other (See Comments) and Cough    Pollen From Flowers    Medication list has been reviewed and updated.  Current Outpatient Medications on File Prior to Visit  Medication Sig Dispense Refill   metroNIDAZOLE (METROGEL) 1 % gel Apply topically daily. Use as needed for rosacea 30 g 0   omeprazole  (PRILOSEC) 20 MG capsule Take 20 mg by mouth daily as needed.     rosuvastatin (CRESTOR) 10 MG tablet Take 1 tablet (10 mg total) by mouth daily. 90 tablet 3   SUMAtriptan (IMITREX) 50 MG tablet Take 1 tablet (50 mg total) by mouth every 2 (two) hours as needed for migraine. Max 100 mg in 24 hours 10 tablet 3   tamsulosin (FLOMAX) 0.4 MG CAPS capsule Take 0.4 mg by mouth at bedtime.     No current facility-administered medications on file prior to visit.    Review of Systems:  ***  Physical Examination: There were no vitals filed for this visit. There were no vitals filed for this visit. There is no height or weight on file to calculate BMI. Ideal Body Weight:    ***  Assessment and Plan: ***  Signed Lamar Blinks, MD

## 2020-11-08 ENCOUNTER — Ambulatory Visit: Payer: Medicare Other | Admitting: Family Medicine

## 2020-11-16 ENCOUNTER — Ambulatory Visit (INDEPENDENT_AMBULATORY_CARE_PROVIDER_SITE_OTHER): Payer: Medicare Other | Admitting: Orthopedic Surgery

## 2020-11-16 ENCOUNTER — Other Ambulatory Visit: Payer: Self-pay

## 2020-11-16 ENCOUNTER — Encounter: Payer: Self-pay | Admitting: Orthopedic Surgery

## 2020-11-16 DIAGNOSIS — M1811 Unilateral primary osteoarthritis of first carpometacarpal joint, right hand: Secondary | ICD-10-CM | POA: Diagnosis not present

## 2020-11-16 DIAGNOSIS — M7712 Lateral epicondylitis, left elbow: Secondary | ICD-10-CM | POA: Diagnosis not present

## 2020-11-16 NOTE — Progress Notes (Signed)
Office Visit Note   Patient: Johnny Davenport           Date of Birth: 13-Mar-1950           MRN: [id number] Visit Date: 11/16/2020              Requested by: Darreld Mclean, MD [address] [place],  [place] [address] PCP: Darreld Mclean, MD   Assessment & Plan: Visit Diagnoses:  1. Lateral epicondylitis, left elbow   2. Arthritis of carpometacarpal (CMC) joint of right thumb     Plan: We discussed the diagnosis, prognosis, non-operative and operative treatment options for lateral epicondylitis.  After our discussion, the patient would like to proceed with continued conservative management with tennis elbow strap, activity modification, and NSAIDs.  We reviewed the risks and benefits of conservative management.  The patient expressed understanding of the reasoning and strategy going forward.  Regarding his thumb CMC OA, it is not bothersome enough yet to pursue treatment.   All patient questions and concerns were addressed.    Follow-Up Instructions: Return if symptoms worsen or fail to improve.   Orders:  No orders of the defined types were placed in this encounter.  No orders of the defined types were placed in this encounter.     Procedures: No procedures performed   Clinical Data: No additional findings.   Subjective: Chief Complaint  Patient presents with   Left Elbow - Pain   Right Hand - Pain    This is a 70 year old right-hand-dominant male who presents with left lateral elbow pain for the last 2 weeks.  He was using hedge clippers about 2 weeks ago when he noted dull aching pain at the lateral aspect of the elbow over the lateral epicondyle.  He has had lateral epicondylitis 2 times in his life previously both times his symptoms improved on their own.  He has been taking Advil, Voltaren, and icing his elbow.  He has a lateral epicondylitis strap which he uses.  He is started to modify his activity over the last several weeks with notable  symptom improvement.  He denies numbness or paresthesias in his fingers.  He also describes pain at the base of his right thumb at the [facility] joint that is intermittent and is dull and aching in nature.  He has never tried any treatment for this pain.   Review of Systems  Constitutional: Negative.   Respiratory: Negative.    Cardiovascular: Negative.   Skin: Negative.   Neurological: Negative.   Psychiatric/Behavioral: Negative.      Objective: Vital Signs: Ht 6' (1.829 m)   Wt 181 lb (82.1 kg)   BMI 24.55 kg/m   Physical Exam Constitutional:      Appearance: Normal appearance.  Cardiovascular:     Rate and Rhythm: Normal rate.     Pulses: Normal pulses.  Pulmonary:     Effort: Pulmonary effort is normal.  Skin:    General: Skin is warm and dry.     Capillary Refill: Capillary refill takes less than 2 seconds.  Neurological:     General: No focal deficit present.     Mental Status: He is alert.    Left Elbow Exam   Tenderness  The patient is experiencing tenderness in the lateral epicondyle.   Range of Motion  Extension:  normal  Flexion:  normal  Pronation:  normal  Supination:  normal   Muscle Strength  Pronation:  5/5  Supination:  5/5   Other  Erythema: absent Scars: absent Sensation: normal Pulse: present  Comments:  TTP directly over lateral epicondyle and just distal.  No pain w/ resisted wrist of middle finger extension.      Specialty Comments:  No specialty comments available.  Imaging: No results found.   PMFS History: Patient Active Problem List   Diagnosis Date Noted   Lateral epicondylitis, left elbow 11/16/2020   Arthritis of carpometacarpal [facility]) joint of right thumb 11/16/2020   Dyslipidemia 09/30/2020   Past Medical History:  Diagnosis Date   Allergy    seasonal allergies   Arthritis    bilateral thumbs   BPH (benign prostatic hyperplasia)    on meds   GERD (gastroesophageal reflux disease)    with certain foods/OTC  meds for tx   Hyperlipidemia    on meds    Family History  Problem Relation Age of Onset   Stroke Mother 53   Healthy Sister    Healthy Sister    Colon cancer Neg Hx    Colon polyps Neg Hx    Esophageal cancer Neg Hx    Stomach cancer Neg Hx    Rectal cancer Neg Hx     Past Surgical History:  Procedure Laterality Date   COLONOSCOPY  2016   TA/hems-5 yr recall   KNEE ARTHROSCOPY W/ MENISCAL REPAIR Right    x 2   POLYPECTOMY  2016   TA   ROTATOR CUFF REPAIR Left    TONSILLECTOMY     WISDOM TOOTH EXTRACTION     Social History   Occupational History   Not on file  Tobacco Use   Smoking status: Never   Smokeless tobacco: Never  Vaping Use   Vaping Use: Never used  Substance and Sexual Activity   Alcohol use: Yes    Alcohol/week: 1.0 standard drink    Types: 1 Standard drinks or equivalent per week   Drug use: No   Sexual activity: Not on file

## 2021-01-06 ENCOUNTER — Encounter: Payer: Self-pay | Admitting: Family Medicine

## 2021-01-09 NOTE — Progress Notes (Signed)
[facility] ([place]) [address], [place], [place] [address] [phone]  Date:  01/11/2021   Name:  Johnny Davenport   DOB:  09-14-1950   MRN:  [id number]  PCP:  Darreld Mclean, MD    Chief Complaint: left elbow pain (Tennis elbow. X2 months ) and middle finger pain  (In the joint area. Going on a month )   History of Present Illness:  Johnny Davenport is a 70 y.o. very pleasant male patient who presents with the following:  Patient seen today with concern of finger pain Most recent visit with myself was in July He contact me via [place] regarding a concern with his right long finger being swollen and stiff especially after playing golf He notes he broke the finger about 25 years ago and it has caused some problems since that time. He broke the DIP joint and had pins placed years ago- playing basketball  More recently, it has been more painful over the last 3 to 4 months  He has noted pain at the PIP joint and swelling, esp after her plays golf He has tried voltaren and it does seem to help   He also has pain in the first first MCP Patient Active Problem List   Diagnosis Date Noted   Lateral epicondylitis, left elbow 11/16/2020   Arthritis of carpometacarpal [facility]) joint of right thumb 11/16/2020   Dyslipidemia 09/30/2020    Past Medical History:  Diagnosis Date   Allergy    seasonal allergies   Arthritis    bilateral thumbs   BPH (benign prostatic hyperplasia)    on meds   GERD (gastroesophageal reflux disease)    with certain foods/OTC meds for tx   Hyperlipidemia    on meds    Past Surgical History:  Procedure Laterality Date   COLONOSCOPY  2016   TA/hems-5 yr recall   KNEE ARTHROSCOPY W/ MENISCAL REPAIR Right    x 2   POLYPECTOMY  2016   TA   ROTATOR CUFF REPAIR Left    TONSILLECTOMY     WISDOM TOOTH EXTRACTION      Social History   Tobacco Use   Smoking status: Never   Smokeless tobacco: Never   Vaping Use   Vaping Use: Never used  Substance Use Topics   Alcohol use: Yes    Alcohol/week: 1.0 standard drink    Types: 1 Standard drinks or equivalent per week   Drug use: No    Family History  Problem Relation Age of Onset   Stroke Mother 30   Healthy Sister    Healthy Sister    Colon cancer Neg Hx    Colon polyps Neg Hx    Esophageal cancer Neg Hx    Stomach cancer Neg Hx    Rectal cancer Neg Hx     Allergies  Allergen Reactions   Other Other (See Comments) and Cough    Pollen From Flowers    Medication list has been reviewed and updated.  Current Outpatient Medications on File Prior to Visit  Medication Sig Dispense Refill   omeprazole (PRILOSEC) 20 MG capsule Take 20 mg by mouth daily as needed.     rosuvastatin (CRESTOR) 10 MG tablet Take 1 tablet (10 mg total) by mouth daily. 90 tablet 3   SUMAtriptan (IMITREX) 50 MG tablet Take 1 tablet (50 mg total) by mouth every 2 (two) hours as needed for migraine. Max 100 mg in  24 hours 10 tablet 3   tamsulosin (FLOMAX) 0.4 MG CAPS capsule Take 0.4 mg by mouth at bedtime.     No current facility-administered medications on file prior to visit.    Review of Systems:  As per HPI- otherwise negative.   Physical Examination: Vitals:   01/11/21 1103  BP: 128/60  Pulse: 60  Temp: (!) 97.4 F (36.3 C)  SpO2: 99%   Vitals:   01/11/21 1103  Weight: 184 lb 3.2 oz (83.6 kg)  Height: 6' (1.829 m)   Body mass index is 24.98 kg/m. Ideal Body Weight: Weight in (lb) to have BMI = 25: 183.9  GEN: No acute distress; alert,appropriate. PULM: Breathing comfortably in no respiratory distress PSYCH: Normally interactive.  Right hand: The long finger especially displays posttraumatic and osteoarthritic changes.  The right long finger DIP joint is fused and immobile.  The PIP joint is thickened and tender.  There is also joint thickening and tenderness at the first MCP  There is also tenderness and minimal swelling over  the left lateral epicondyles   Assessment and Plan: Elbow pain, unspecified laterality  Post-traumatic osteoarthritis of right hand - Plan: DG Hand Complete Right  Gastroesophageal reflux disease, unspecified whether esophagitis present - Plan: omeprazole (PRILOSEC) 20 MG capsule  Patient seen today with a couple of musculoskeletal concerns.  He has had left-sided tennis elbow off and on for years.  He did see orthopedics for this, they recommended that he just try rested.  For the time being he thinks it is actually improved from previous, he will monitor for now  History of traumatic fracture to the right long finger DIP.  He has severe arthritis/previous joint at the DIP and apparent severe arthritis of the PIP as well He is having some success with Voltaren, advised she can use this up to 4 times a day.  He can also use Tylenol, conservative doses of oral NSAIDs.  For the time being he declines a stronger medication such as tramadol, and also declines to see orthopedics  We will obtain plain films today  Signed Lamar Blinks, MD

## 2021-01-10 ENCOUNTER — Ambulatory Visit: Payer: Medicare Other | Admitting: Family Medicine

## 2021-01-11 ENCOUNTER — Other Ambulatory Visit: Payer: Self-pay

## 2021-01-11 ENCOUNTER — Ambulatory Visit (INDEPENDENT_AMBULATORY_CARE_PROVIDER_SITE_OTHER): Payer: Medicare Other | Admitting: Family Medicine

## 2021-01-11 ENCOUNTER — Ambulatory Visit (HOSPITAL_BASED_OUTPATIENT_CLINIC_OR_DEPARTMENT_OTHER)
Admission: RE | Admit: 2021-01-11 | Discharge: 2021-01-11 | Disposition: A | Discharge status: Home / Self Care | Payer: Medicare Other | Source: Ambulatory Visit | Attending: Family Medicine | Admitting: Family Medicine

## 2021-01-11 VITALS — BP 128/60 | HR 60 | Temp 97.4°F | Ht 72.0 in | Wt 184.2 lb

## 2021-01-11 DIAGNOSIS — K219 Gastro-esophageal reflux disease without esophagitis: Secondary | ICD-10-CM | POA: Diagnosis not present

## 2021-01-11 DIAGNOSIS — M19141 Post-traumatic osteoarthritis, right hand: Secondary | ICD-10-CM | POA: Insufficient documentation

## 2021-01-11 DIAGNOSIS — M25529 Pain in unspecified elbow: Secondary | ICD-10-CM | POA: Diagnosis not present

## 2021-01-11 MED ORDER — OMEPRAZOLE 20 MG PO CPDR: 20.0000 mg | DELAYED_RELEASE_CAPSULE | Freq: Every day | ORAL | 3 refills | Status: DC | PRN

## 2021-01-11 NOTE — Patient Instructions (Addendum)
It was good to see you- please have x-rays of your right hand taken on the ground floor I will be in touch with your reports Ok to use the voltaren up to 4x a day  Let me know if you would like to see hand surgery after all, or if you would like to try something like tramadol to use on occasion

## 2021-01-12 ENCOUNTER — Encounter: Payer: Self-pay | Admitting: Family Medicine

## 2021-03-06 ENCOUNTER — Encounter: Payer: Self-pay | Admitting: Family Medicine

## 2021-03-07 ENCOUNTER — Ambulatory Visit: Payer: Medicare Other | Admitting: Family Medicine

## 2021-03-08 ENCOUNTER — Encounter: Payer: Self-pay | Admitting: Family Medicine

## 2021-03-08 ENCOUNTER — Ambulatory Visit (INDEPENDENT_AMBULATORY_CARE_PROVIDER_SITE_OTHER): Payer: Medicare Other | Admitting: Family Medicine

## 2021-03-08 VITALS — BP 138/80 | HR 60 | Temp 97.6°F | Ht 73.0 in | Wt 186.0 lb

## 2021-03-08 DIAGNOSIS — J0140 Acute pansinusitis, unspecified: Secondary | ICD-10-CM

## 2021-03-08 MED ORDER — DOXYCYCLINE HYCLATE 100 MG PO TABS: 100.0000 mg | ORAL_TABLET | Freq: Two times a day (BID) | ORAL | 0 refills | Status: AC

## 2021-03-08 MED ORDER — PREDNISONE 20 MG PO TABS: 40.0000 mg | ORAL_TABLET | Freq: Every day | ORAL | 0 refills | Status: AC

## 2021-03-08 NOTE — Patient Instructions (Signed)
Take the prednisone. If no better by Saturday, take the doxycycline. Ok to continue over the counter meds as needed.   Continue to push fluids, practice good hand hygiene, and cover your mouth if you cough.  If you start having fevers, shaking or shortness of breath, seek immediate care.  Let us know if you need anything.

## 2021-03-08 NOTE — Progress Notes (Signed)
Chief Complaint  Patient presents with   Sinus Problem    Congestion for 8-9 days, drainage while lying down    Dorris Fetch here for URI complaints.  Duration: 9 days  Associated symptoms: sinus congestion, sinus pain, rhinorrhea, and itchy watery eyes Denies: ear pain, ear drainage, sore throat, wheezing, shortness of breath, myalgia, and fevers, N/V/D, dental pain Treatment to date: Generic Theraflu Sick contacts: No  Past Medical History:  Diagnosis Date   Allergy    seasonal allergies   Arthritis    bilateral thumbs   BPH (benign prostatic hyperplasia)    on meds   GERD (gastroesophageal reflux disease)    with certain foods/OTC meds for tx   Hyperlipidemia    on meds    Objective BP 138/80    Pulse 60    Temp 97.6 F (36.4 C) (Oral)    Ht 6\' 1"  (1.854 m)    Wt 186 lb (84.4 kg)    SpO2 98%    BMI 24.54 kg/m  General: Awake, alert, appears stated age HEENT: AT, [place], ears patent b/l and TM's neg, nares patent w/o discharge, pharynx pink and without exudates, MMM Neck: No masses or asymmetry Heart: RRR Lungs: CTAB, no accessory muscle use Psych: Age appropriate judgment and insight, normal mood and affect  Acute pansinusitis, recurrence not specified - Plan: doxycycline (VIBRA-TABS) 100 MG tablet, predniSONE (DELTASONE) 20 MG tablet  5 d pred burst 40 mg/d, take doxy in 2 d if he fails to improve.  Continue to push fluids, practice good hand hygiene, cover mouth when coughing. F/u prn. If starting to experience fevers, shaking, or shortness of breath, seek immediate care. Pt voiced understanding and agreement to the plan.  [place], DO 03/08/21 11:11 AM

## 2021-03-14 ENCOUNTER — Ambulatory Visit: Payer: Medicare Other | Admitting: Family Medicine

## 2021-03-19 ENCOUNTER — Encounter: Payer: Self-pay | Admitting: Family Medicine

## 2021-03-19 DIAGNOSIS — R350 Frequency of micturition: Secondary | ICD-10-CM | POA: Diagnosis not present

## 2021-03-19 MED ORDER — DOXYCYCLINE HYCLATE 100 MG PO CAPS: 100.0000 mg | ORAL_CAPSULE | Freq: Two times a day (BID) | ORAL | 0 refills | Status: DC

## 2021-03-26 ENCOUNTER — Encounter: Payer: Self-pay | Admitting: Family Medicine

## 2021-05-28 ENCOUNTER — Telehealth: Payer: Self-pay | Admitting: Family Medicine

## 2021-05-28 ENCOUNTER — Other Ambulatory Visit: Payer: Self-pay

## 2021-05-28 DIAGNOSIS — G43009 Migraine without aura, not intractable, without status migrainosus: Secondary | ICD-10-CM

## 2021-05-28 MED ORDER — SUMATRIPTAN SUCCINATE 50 MG PO TABS: 50.0000 mg | ORAL_TABLET | ORAL | 1 refills | Status: DC | PRN

## 2021-05-28 NOTE — Telephone Encounter (Signed)
Medication:  ?SUMAtriptan (IMITREX) 50 MG tablet [[id number]]  ?  ? ?Has the patient contacted their pharmacy? No. ?(If no, request that the patient contact the pharmacy for the refill.) ?(If yes, when and what did the pharmacy advise?) ? ?  ? ?Preferred Pharmacy (with phone number or street name):  ?[facility].  ?[place] [place] [address]  ?Phone:  [phone]  Fax:  [phone]  ?  ? ?Agent: Please be advised that RX refills may take up to 3 business days. We ask that you follow-up with your pharmacy.  ?

## 2021-05-28 NOTE — Telephone Encounter (Signed)
Refill sent.

## 2021-06-04 ENCOUNTER — Encounter: Payer: Self-pay | Admitting: Family Medicine

## 2021-06-04 ENCOUNTER — Telehealth: Payer: Self-pay | Admitting: Family Medicine

## 2021-06-04 NOTE — Telephone Encounter (Signed)
Do you have any suggestions? [place]? ?

## 2021-06-04 NOTE — Telephone Encounter (Signed)
Pt states he got a letter from his insurance stating that his SUMAtriptan (IMITREX) 50 MG tablet [[id number]]   Will no longer be covered. He wants to know if there is another med that [facility] recommend.  ?

## 2021-07-31 ENCOUNTER — Encounter: Payer: Self-pay | Admitting: Family Medicine

## 2021-07-31 DIAGNOSIS — G43009 Migraine without aura, not intractable, without status migrainosus: Secondary | ICD-10-CM

## 2021-07-31 MED ORDER — SUMATRIPTAN SUCCINATE 50 MG PO TABS: 50.0000 mg | ORAL_TABLET | ORAL | 1 refills | Status: DC | PRN

## 2021-09-10 ENCOUNTER — Ambulatory Visit (INDEPENDENT_AMBULATORY_CARE_PROVIDER_SITE_OTHER): Payer: Medicare Other

## 2021-09-10 VITALS — Ht 73.0 in | Wt 181.0 lb

## 2021-09-10 DIAGNOSIS — Z0000 Encounter for general adult medical examination without abnormal findings: Secondary | ICD-10-CM | POA: Diagnosis not present

## 2021-09-10 NOTE — Progress Notes (Signed)
Subjective:   Johnny Davenport is a 71 y.o. male who presents for Medicare Annual/Subsequent preventive examination.  I connected with Delfin today by telephone and verified that I am speaking with the correct person using two identifiers. Location patient: home Location provider: work Persons participating in the virtual visit: patient, Marine scientist.    I discussed the limitations, risks, security and privacy concerns of performing an evaluation and management service by telephone and the availability of in person appointments. I also discussed with the patient that there may be a patient responsible charge related to this service. The patient expressed understanding and verbally consented to this telephonic visit.    Interactive audio and video telecommunications were attempted between this provider and patient, however failed, due to patient having technical difficulties OR patient did not have access to video capability.  We continued and completed visit with audio only.  Some vital signs may be absent or patient reported.   Time Spent with patient on telephone encounter: 20 minutes   Review of Systems     Cardiac Risk Factors include: advanced age (>53mn, >>19women);male gender;dyslipidemia     Objective:    Today's Vitals   09/10/21 1230  Weight: 181 lb (82.1 kg)  Height: '6\' 1"'$  (1.854 m)   Body mass index is 23.88 kg/m.     09/10/2021   12:32 PM 09/04/2020    1:42 PM 08/03/2019   11:06 AM 01/05/2016   10:52 AM  Advanced Directives  Does Patient Have a Medical Advance Directive? Yes Yes No No  Type of AParamedicof AVan BurenLiving will HAspersLiving will    Copy of [place] Chart? No - copy requested No - copy requested    Would patient like information on creating a medical advance directive?   No - Patient declined No - patient declined information    Current Medications (verified) Outpatient Encounter  Medications as of 09/10/2021  Medication Sig   omeprazole (PRILOSEC) 20 MG capsule Take 1 capsule (20 mg total) by mouth daily as needed.   rosuvastatin (CRESTOR) 10 MG tablet Take 1 tablet (10 mg total) by mouth daily.   SUMAtriptan (IMITREX) 50 MG tablet Take 1 tablet (50 mg total) by mouth every 2 (two) hours as needed for migraine. Max 100 mg in 24 hours   tamsulosin (FLOMAX) 0.4 MG CAPS capsule Take 0.4 mg by mouth at bedtime.   doxycycline (VIBRAMYCIN) 100 MG capsule Take 1 capsule (100 mg total) by mouth 2 (two) times daily.   No facility-administered encounter medications on file as of 09/10/2021.    Allergies (verified) Other   History: Past Medical History:  Diagnosis Date   Allergy    seasonal allergies   Arthritis    bilateral thumbs   BPH (benign prostatic hyperplasia)    on meds   GERD (gastroesophageal reflux disease)    with certain foods/OTC meds for tx   Hyperlipidemia    on meds   Past Surgical History:  Procedure Laterality Date   COLONOSCOPY  2016   TA/hems-5 yr recall   KNEE ARTHROSCOPY W/ MENISCAL REPAIR Right    x 2   POLYPECTOMY  2016   TA   ROTATOR CUFF REPAIR Left    TONSILLECTOMY     WISDOM TOOTH EXTRACTION     Family History  Problem Relation Age of Onset   Stroke Mother 814  Healthy Sister    Healthy Sister    Colon cancer  Neg Hx    Colon polyps Neg Hx    Esophageal cancer Neg Hx    Stomach cancer Neg Hx    Rectal cancer Neg Hx    Social History   Socioeconomic History   Marital status: Married    Spouse name: Not on file   Number of children: Not on file   Years of education: Not on file   Highest education level: Not on file  Occupational History   Not on file  Tobacco Use   Smoking status: Never   Smokeless tobacco: Never  Vaping Use   Vaping Use: Never used  Substance and Sexual Activity   Alcohol use: Yes    Alcohol/week: 1.0 standard drink of alcohol    Types: 1 Standard drinks or equivalent per week   Drug use: No    Sexual activity: Not on file  Other Topics Concern   Not on file  Social History Narrative   Not on file   Social Determinants of Health   Financial Resource Strain: Low Risk  (09/10/2021)   Overall Financial Resource Strain (CARDIA)    Difficulty of Paying Living Expenses: Not hard at all  Food Insecurity: No Food Insecurity (09/10/2021)   Hunger Vital Sign    Worried About Running Out of Food in the Last Year: Never true    Ran Out of Food in the Last Year: Never true  Transportation Needs: No Transportation Needs (09/10/2021)   PRAPARE - Hydrologist (Medical): No    Lack of Transportation (Non-Medical): No  Physical Activity: Sufficiently Active (09/10/2021)   Exercise Vital Sign    Days of Exercise per Week: 7 days    Minutes of Exercise per Session: 30 min  Stress: No Stress Concern Present (09/10/2021)   [place]    Feeling of Stress : Not at all  Social Connections: Moderately Integrated (09/04/2020)   Social Connection and Isolation Panel [NHANES]    Frequency of Communication with Friends and Family: More than three times a week    Frequency of Social Gatherings with Friends and Family: More than three times a week    Attends Religious Services: Never    Marine scientist or Organizations: Yes    Attends Music therapist: More than 4 times per year    Marital Status: Married    Tobacco Counseling Counseling given: Not Answered   Clinical Intake:  Pre-visit preparation completed: Yes  Pain : No/denies pain     BMI - recorded: 23.88 Nutritional Status: BMI of 19-24  Normal Nutritional Risks: None Diabetes: No  How often do you need to have someone help you when you read instructions, pamphlets, or other written materials from your doctor or pharmacy?: 1 - Never  Diabetic?No  Interpreter Needed?: No  Information entered by :: Caroleen Hamman  LPN   Activities of Daily Living    09/10/2021   12:36 PM 09/09/2021    4:40 PM  In your present state of health, do you have any difficulty performing the following activities:  Hearing? 0 0  Vision? 0 0  Difficulty concentrating or making decisions? 0 0  Walking or climbing stairs? 0 0  Dressing or bathing? 0   Doing errands, shopping? 0 0  Preparing Food and eating ? N N  Using the Toilet? N N  In the past six months, have you accidently leaked urine? N N  Do you have  problems with loss of bowel control? N N  Managing your Medications? N N  Managing your Finances? N N  Housekeeping or managing your Housekeeping? N N    Patient Care Team: [facility], Gay Filler, MD as PCP - General (Family Medicine)  Indicate any recent Medical Services you may have received from other than [facility] providers in the past year (date may be approximate).     Assessment:   This is a routine wellness examination for Terell.  Hearing/Vision screen Hearing Screening - Comments:: No issues Vision Screening - Comments:: Last eye exam-11/2021  Dietary issues and exercise activities discussed: Current Exercise Habits: Home exercise routine, Type of exercise: stretching;Other - see comments;strength training/weights (golf, elliptical), Time (Minutes): 30, Frequency (Times/Week): 7, Weekly Exercise (Minutes/Week): 210, Intensity: Mild, Exercise limited by: None identified   Goals Addressed             This Visit's Progress    Maintain healthy active lifestyle.   On track      Depression Screen    09/10/2021   12:36 PM 03/08/2021   10:54 AM 01/11/2021   11:05 AM 10/05/2020    8:18 AM 09/04/2020    1:44 PM 08/03/2019   11:10 AM  PHQ 2/9 Scores  PHQ - 2 Score 0 0 0 0 0 0    Fall Risk    09/10/2021   12:34 PM 09/09/2021    4:40 PM 03/08/2021   10:54 AM 01/11/2021   11:05 AM 10/05/2020    8:17 AM  Fall Risk   Falls in the past year? 0 0 0 0 0  Number falls in past yr: 0 0 0 0 0  Injury with Fall? 0  0 0 0 0  Risk for fall due to :   No Fall Risks No Fall Risks No Fall Risks  Follow up Falls prevention discussed  Falls evaluation completed Falls evaluation completed Falls evaluation completed    [place]:  Any stairs in or around the home? Yes  If so, are there any without handrails? No  Home free of loose throw rugs in walkways, pet beds, electrical cords, etc? Yes  Adequate lighting in your home to reduce risk of falls? Yes   ASSISTIVE DEVICES UTILIZED TO PREVENT FALLS:  Life alert? No  Use of a cane, walker or w/c? No  Grab bars in the bathroom? No  Shower chair or bench in shower? No  Elevated toilet seat or a handicapped toilet? No   TIMED UP AND GO:  Was the test performed? No . Phone vsit   Cognitive Function:Normal cognitive status assessed by this Nurse Health Advisor. No abnormalities found.          Immunizations Immunization History  Administered Date(s) Administered   Influenza-Unspecified 12/10/2019, 01/04/2021   PFIZER(Purple Top)SARS-COV-2 Vaccination 04/02/2019, 04/22/2019, 12/24/2019, 12/23/2020   Pneumococcal Conjugate-13 11/10/2018   Pneumococcal Polysaccharide-23 12/10/2018   Tdap 07/09/2017    TDAP status: Up to date  Flu Vaccine status: Up to date  Pneumococcal vaccine status: Up to date  Covid-19 vaccine status: Completed vaccines  Qualifies for Shingles Vaccine? Yes   Zostavax completed No   Shingrix Completed?: No.    Education has been provided regarding the importance of this vaccine. Patient has been advised to call insurance company to determine out of pocket expense if they have not yet received this vaccine. Advised may also receive vaccine at local pharmacy or Health Dept. Verbalized acceptance and understanding.  Screening  Tests Health Maintenance  Topic Date Due   COVID-19 Vaccine (5 - Pfizer series) 02/17/2021   INFLUENZA VACCINE  10/09/2021   COLONOSCOPY (Pts 45-51yr Insurance  coverage will need to be confirmed)  02/22/2023   TETANUS/TDAP  07/10/2027   Pneumonia Vaccine 71 Years old  Completed   Hepatitis C Screening  Completed   HPV VACCINES  Aged Out   Zoster Vaccines- Shingrix  Discontinued    Health Maintenance  Health Maintenance Due  Topic Date Due   COVID-19 Vaccine (5 - Pfizer series) 02/17/2021    Colorectal cancer screening: Type of screening: Colonoscopy. Completed 02/22/2020. Repeat every 3 years  Lung Cancer Screening: (Low Dose CT Chest recommended if Age 73-80years, 30 pack-year currently smoking OR have quit w/in 15years.) does not qualify.     Additional Screening:  Hepatitis C Screening: Completed 10/04/2019  Vision Screening: Recommended annual ophthalmology exams for early detection of glaucoma and other disorders of the eye. Is the patient up to date with their annual eye exam?  Yes  Who is the provider or what is the name of the office in which the patient attends annual eye exams? unknown  Dental Screening: Recommended annual dental exams for proper oral hygiene  Community Resource Referral / Chronic Care Management: CRR required this visit?  No   CCM required this visit?  No      Plan:     I have personally reviewed and noted the following in the patient's chart:   Medical and social history Use of alcohol, tobacco or illicit drugs  Current medications and supplements including opioid prescriptions. Patient is not currently taking opioid prescriptions. Functional ability and status Nutritional status Physical activity Advanced directives List of other physicians Hospitalizations, surgeries, and ER visits in previous 12 months Vitals Screenings to include cognitive, depression, and falls Referrals and appointments  In addition, I have reviewed and discussed with patient certain preventive protocols, quality metrics, and best practice recommendations. A written personalized care plan for preventive services as  well as general preventive health recommendations were provided to patient.   Due to this being a telephonic visit, the after visit summary with patients personalized plan was offered to patient via mail or my-chart. Patient would like to access on my-chart.   MMarta Antu LPN   [id number] Nurse Health Advisor  Nurse Notes: None

## 2021-09-10 NOTE — Patient Instructions (Signed)
Mr. Johnny Davenport , Thank you for taking time to complete your Medicare Wellness Visit. I appreciate your ongoing commitment to your health goals. Please review the following plan we discussed and let me know if I can assist you in the future.   Screening recommendations/referrals: Colonoscopy: Completed 02/22/2020-Due 02/22/2023 Recommended yearly ophthalmology/optometry visit for glaucoma screening and checkup Recommended yearly dental visit for hygiene and checkup  Vaccinations: Influenza vaccine: Up to date Pneumococcal vaccine: Up to date Tdap vaccine: Up to date Shingles vaccine: Completed vaccines   Covid-19: Up to date  Advanced directives: Please bring a copy of Living Will and/or Healthcare Power of Attorney for your chart.   Conditions/risks identified: See problem list  Next appointment: Follow up in one year for your annual wellness visit.   Preventive Care 73 Years and Older, Male Preventive care refers to lifestyle choices and visits with your health care provider that can promote health and wellness. What does preventive care include? A yearly physical exam. This is also called an annual well check. Dental exams once or twice a year. Routine eye exams. Ask your health care provider how often you should have your eyes checked. Personal lifestyle choices, including: Daily care of your teeth and gums. Regular physical activity. Eating a healthy diet. Avoiding tobacco and drug use. Limiting alcohol use. Practicing safe sex. Taking low doses of aspirin every day. Taking vitamin and mineral supplements as recommended by your health care provider. What happens during an annual well check? The services and screenings done by your health care provider during your annual well check will depend on your age, overall health, lifestyle risk factors, and family history of disease. Counseling  Your health care provider may ask you questions about your: Alcohol use. Tobacco use. Drug  use. Emotional well-being. Home and relationship well-being. Sexual activity. Eating habits. History of falls. Memory and ability to understand (cognition). Work and work Statistician. Screening  You may have the following tests or measurements: Height, weight, and BMI. Blood pressure. Lipid and cholesterol levels. These may be checked every 5 years, or more frequently if you are over 51 years old. Skin check. Lung cancer screening. You may have this screening every year starting at age 38 if you have a 30-pack-year history of smoking and currently smoke or have quit within the past 15 years. Fecal occult blood test (FOBT) of the stool. You may have this test every year starting at age 40. Flexible sigmoidoscopy or colonoscopy. You may have a sigmoidoscopy every 5 years or a colonoscopy every 10 years starting at age 54. Prostate cancer screening. Recommendations will vary depending on your family history and other risks. Hepatitis C blood test. Hepatitis B blood test. Sexually transmitted disease (STD) testing. Diabetes screening. This is done by checking your blood sugar (glucose) after you have not eaten for a while (fasting). You may have this done every 1-3 years. Abdominal aortic aneurysm (AAA) screening. You may need this if you are a current or former smoker. Osteoporosis. You may be screened starting at age 9 if you are at high risk. Talk with your health care provider about your test results, treatment options, and if necessary, the need for more tests. Vaccines  Your health care provider may recommend certain vaccines, such as: Influenza vaccine. This is recommended every year. Tetanus, diphtheria, and acellular pertussis (Tdap, Td) vaccine. You may need a Td booster every 10 years. Zoster vaccine. You may need this after age 6. Pneumococcal 13-valent conjugate (PCV13) vaccine. One dose is  recommended after age 66. Pneumococcal polysaccharide (PPSV23) vaccine. One dose is  recommended after age 78. Talk to your health care provider about which screenings and vaccines you need and how often you need them. This information is not intended to replace advice given to you by your health care provider. Make sure you discuss any questions you have with your health care provider. Document Released: 03/24/2015 Document Revised: 11/15/2015 Document Reviewed: 12/27/2014 Elsevier Interactive Patient Education  2017 [place] Prevention in the Home Falls can cause injuries. They can happen to people of all ages. There are many things you can do to make your home safe and to help prevent falls. What can I do on the outside of my home? Regularly fix the edges of walkways and driveways and fix any cracks. Remove anything that might make you trip as you walk through a door, such as a raised step or threshold. Trim any bushes or trees on the path to your home. Use bright outdoor lighting. Clear any walking paths of anything that might make someone trip, such as rocks or tools. Regularly check to see if handrails are loose or broken. Make sure that both sides of any steps have handrails. Any raised decks and porches should have guardrails on the edges. Have any leaves, snow, or ice cleared regularly. Use sand or salt on walking paths during winter. Clean up any spills in your garage right away. This includes oil or grease spills. What can I do in the bathroom? Use night lights. Install grab bars by the toilet and in the tub and shower. Do not use towel bars as grab bars. Use non-skid mats or decals in the tub or shower. If you need to sit down in the shower, use a plastic, non-slip stool. Keep the floor dry. Clean up any water that spills on the floor as soon as it happens. Remove soap buildup in the tub or shower regularly. Attach bath mats securely with double-sided non-slip rug tape. Do not have throw rugs and other things on the floor that can make you  trip. What can I do in the bedroom? Use night lights. Make sure that you have a light by your bed that is easy to reach. Do not use any sheets or blankets that are too big for your bed. They should not hang down onto the floor. Have a firm chair that has side arms. You can use this for support while you get dressed. Do not have throw rugs and other things on the floor that can make you trip. What can I do in the kitchen? Clean up any spills right away. Avoid walking on wet floors. Keep items that you use a lot in easy-to-reach places. If you need to reach something above you, use a strong step stool that has a grab bar. Keep electrical cords out of the way. Do not use floor polish or wax that makes floors slippery. If you must use wax, use non-skid floor wax. Do not have throw rugs and other things on the floor that can make you trip. What can I do with my stairs? Do not leave any items on the stairs. Make sure that there are handrails on both sides of the stairs and use them. Fix handrails that are broken or loose. Make sure that handrails are as long as the stairways. Check any carpeting to make sure that it is firmly attached to the stairs. Fix any carpet that is loose or worn. Avoid having  throw rugs at the top or bottom of the stairs. If you do have throw rugs, attach them to the floor with carpet tape. Make sure that you have a light switch at the top of the stairs and the bottom of the stairs. If you do not have them, ask someone to add them for you. What else can I do to help prevent falls? Wear shoes that: Do not have high heels. Have rubber bottoms. Are comfortable and fit you well. Are closed at the toe. Do not wear sandals. If you use a stepladder: Make sure that it is fully opened. Do not climb a closed stepladder. Make sure that both sides of the stepladder are locked into place. Ask someone to hold it for you, if possible. Clearly mark and make sure that you can  see: Any grab bars or handrails. First and last steps. Where the edge of each step is. Use tools that help you move around (mobility aids) if they are needed. These include: Canes. Walkers. Scooters. Crutches. Turn on the lights when you go into a dark area. Replace any light bulbs as soon as they burn out. Set up your furniture so you have a clear path. Avoid moving your furniture around. If any of your floors are uneven, fix them. If there are any pets around you, be aware of where they are. Review your medicines with your doctor. Some medicines can make you feel dizzy. This can increase your chance of falling. Ask your doctor what other things that you can do to help prevent falls. This information is not intended to replace advice given to you by your health care provider. Make sure you discuss any questions you have with your health care provider. Document Released: 12/22/2008 Document Revised: 08/03/2015 Document Reviewed: 04/01/2014 Elsevier Interactive Patient Education  2017 [employer].

## 2021-09-14 ENCOUNTER — Encounter: Payer: Self-pay | Admitting: Family Medicine

## 2021-09-14 DIAGNOSIS — E785 Hyperlipidemia, unspecified: Secondary | ICD-10-CM

## 2021-09-14 MED ORDER — ROSUVASTATIN CALCIUM 10 MG PO TABS: 10.0000 mg | ORAL_TABLET | Freq: Every day | ORAL | 3 refills | Status: DC

## 2021-10-05 DIAGNOSIS — R350 Frequency of micturition: Secondary | ICD-10-CM | POA: Diagnosis not present

## 2021-10-11 NOTE — Progress Notes (Signed)
[place] at [facility] [address], [place], [place] [address] [phone]  Date:  10/17/2021   Name:  Johnny Davenport   DOB:  01/02/1951   MRN:  [id number]  PCP:  Darreld Mclean, MD    Chief Complaint: Annual Exam (Concerns/ questions: 1. Pt says he might not need a PSA. 2. Itchy Veracose veins. 3. Left foot numbness. 4. Some pain in the right hand/)   History of Present Illness:  Johnny Davenport is a 71 y.o. very pleasant male patient who presents with the following:  Pt seen today for medicare physical exam- BPH, gerd, hyperlipidemia Most recent visit with myself was in November   Covid booster Flu shot done last year Colon UTD  Most recent labs one year ago   He was seen by urology last year- August Dr. Lorelei Pont; Good morning. I visited with Dr. Milford Cage of [facility] and we reviewed the most recent PSA test. I just wanted to update you. Dr. Milford Cage was not overly concerned about my levels and said the best thing to do is just come back in 6 months and do another PSA test. The level has increased less than 1.0 in 3 years when you look at the overall. However, he also did say not to ride a bike before the test next time. He did do a DRE and found nothing except a slightly enlarged prostate.   Crestor Prilosec Rapaflo Imitrex prn   He notes OA pain in his right hand- he is using voltaren and sometimes advil Urology is following his PSA  Their grandson is 76 yo  He sometimes has some numbness in his left small toe- will come and go   He is having nasal congestion esp at night- claritin and flonase helps  He is doing plenty of exercise- stretch class twice a week, some strength classes, golf 3x a week and walking a lot No CP or SOB  Patient Active Problem List   Diagnosis Date Noted   Lateral epicondylitis, left elbow 11/16/2020   Arthritis of carpometacarpal [facility]) joint of right thumb 11/16/2020   Dyslipidemia  09/30/2020    Past Medical History:  Diagnosis Date   Allergy    seasonal allergies   Arthritis    bilateral thumbs   BPH (benign prostatic hyperplasia)    on meds   GERD (gastroesophageal reflux disease)    with certain foods/OTC meds for tx   Hyperlipidemia    on meds    Past Surgical History:  Procedure Laterality Date   COLONOSCOPY  2016   TA/hems-5 yr recall   KNEE ARTHROSCOPY W/ MENISCAL REPAIR Right    x 2   POLYPECTOMY  2016   TA   ROTATOR CUFF REPAIR Left    TONSILLECTOMY     WISDOM TOOTH EXTRACTION      Social History   Tobacco Use   Smoking status: Never   Smokeless tobacco: Never  Vaping Use   Vaping Use: Never used  Substance Use Topics   Alcohol use: Yes    Alcohol/week: 1.0 standard drink of alcohol    Types: 1 Standard drinks or equivalent per week   Drug use: No    Family History  Problem Relation Age of Onset   Stroke Mother 76   Healthy Sister    Healthy Sister    Colon cancer Neg Hx    Colon polyps Neg Hx    Esophageal cancer Neg Hx  Stomach cancer Neg Hx    Rectal cancer Neg Hx     Allergies  Allergen Reactions   Other Other (See Comments) and Cough    Pollen From Flowers    Medication list has been reviewed and updated.  Current Outpatient Medications on File Prior to Visit  Medication Sig Dispense Refill   omeprazole (PRILOSEC) 20 MG capsule Take 1 capsule (20 mg total) by mouth daily as needed. 90 capsule 3   rosuvastatin (CRESTOR) 10 MG tablet Take 1 tablet (10 mg total) by mouth daily. 90 tablet 3   silodosin (RAPAFLO) 8 MG CAPS capsule Take 8 mg by mouth daily.     SUMAtriptan (IMITREX) 50 MG tablet Take 1 tablet (50 mg total) by mouth every 2 (two) hours as needed for migraine. Max 100 mg in 24 hours 10 tablet 1   No current facility-administered medications on file prior to visit.    Review of Systems:  As per HPI- otherwise negative.   Physical Examination: Vitals:   10/17/21 0804  BP: 117/72  Pulse: 74   Resp: 18  Temp: 97.7 F (36.5 C)  SpO2: 98%   Vitals:   10/17/21 0804  Weight: 183 lb 12.8 oz (83.4 kg)  Height: 6' (1.829 m)   Body mass index is 24.93 kg/m. Ideal Body Weight: Weight in (lb) to have BMI = 25: 183.9  GEN: no acute distress. Normal weight, looks well  HEENT: Atraumatic, Normocephalic.  Bilateral TM wnl, oropharynx normal.  PEERL,EOMI.   Ears and Nose: No external deformity. CV: RRR, No M/G/R. No JVD. No thrill. No extra heart sounds. PULM: CTA B, no wheezes, crackles, rhonchi. No retractions. No resp. distress. No accessory muscle use. ABD: S, NT, ND, +BS. No rebound. No HSM. EXTR: No c/c/e PSYCH: Normally interactive. Conversant.  Thickening of the hand joints c/w OA Foot exam- normal pulses and sensation bilaterally, no edema   EKG:  SR, RSR' V1- no old tracing for comparison. Benign EKG  Assessment and Plan: Dyslipidemia - Plan: Lipid panel, CT CARDIAC SCORING (SELF PAY ONLY)  PSA elevation  Screening for deficiency anemia - Plan: CBC  Screening for diabetes mellitus - Plan: Comprehensive metabolic panel, Hemoglobin A1c  Numbness of left foot - Plan: B12 and Folate Panel  Lightheadedness - Plan: EKG 12-Lead  Check up today- Will plan further follow- up pending labs. Encouraged healthy diet and exercise routine  Intermittent numbness left pinky toe- likely neuropathy. Check B12 and folate Pt would like to do coronary CT- ordered  Discussed OA with pt   Signed Lamar Blinks, MD

## 2021-10-11 NOTE — Patient Instructions (Addendum)
Good to see you again today!   I will be in touch with your labs Perhaps try some Tylenol as well for your hand pain We have several good hand surgeons in [place] Dr Burney Gauze with [facility] Drs Caralyn Guile and Amedeo Plenty with [facility] [place] Silos with [facility] Let me know if you would like a referral at any time

## 2021-10-17 ENCOUNTER — Encounter: Payer: Self-pay | Admitting: Family Medicine

## 2021-10-17 ENCOUNTER — Ambulatory Visit (INDEPENDENT_AMBULATORY_CARE_PROVIDER_SITE_OTHER): Payer: Medicare Other | Admitting: Family Medicine

## 2021-10-17 VITALS — BP 117/72 | HR 74 | Temp 97.7°F | Resp 18 | Ht 72.0 in | Wt 183.8 lb

## 2021-10-17 DIAGNOSIS — E785 Hyperlipidemia, unspecified: Secondary | ICD-10-CM

## 2021-10-17 DIAGNOSIS — R200 Anesthesia of skin: Secondary | ICD-10-CM | POA: Diagnosis not present

## 2021-10-17 DIAGNOSIS — R42 Dizziness and giddiness: Secondary | ICD-10-CM

## 2021-10-17 DIAGNOSIS — Z130 Encounter for screening for diseases of the blood and blood-forming organs and certain disorders involving the immune mechanism: Secondary | ICD-10-CM | POA: Diagnosis not present

## 2021-10-17 DIAGNOSIS — R972 Elevated prostate specific antigen [PSA]: Secondary | ICD-10-CM

## 2021-10-17 DIAGNOSIS — Z131 Encounter for screening for diabetes mellitus: Secondary | ICD-10-CM

## 2021-10-17 LAB — COMPREHENSIVE METABOLIC PANEL
ALT: 32 U/L (ref 0–53)
AST: 33 U/L (ref 0–37)
Albumin: 4.5 g/dL (ref 3.5–5.2)
Alkaline Phosphatase: 65 U/L (ref 39–117)
BUN: 22 mg/dL (ref 6–23)
CO2: 32 mEq/L (ref 19–32)
Calcium: 9.2 mg/dL (ref 8.4–10.5)
Chloride: 102 mEq/L (ref 96–112)
Creatinine, Ser: 1.06 mg/dL (ref 0.40–1.50)
GFR: 69.94 mL/min (ref >60.00)
Glucose, Bld: 86 mg/dL (ref 70–99)
Potassium: 4.5 mEq/L (ref 3.5–5.1)
Sodium: 140 mEq/L (ref 135–145)
Total Bilirubin: 0.9 mg/dL (ref 0.2–1.2)
Total Protein: 6.6 g/dL (ref 6.0–8.3)

## 2021-10-17 LAB — CBC
HCT: 43.1 % (ref 39.0–52.0)
Hemoglobin: 14.4 g/dL (ref 13.0–17.0)
MCHC: 33.5 g/dL (ref 30.0–36.0)
MCV: 98.4 fl (ref 78.0–100.0)
Platelets: 174 10*3/uL (ref 150.0–400.0)
RBC: 4.38 Mil/uL (ref 4.22–5.81)
RDW: 12.6 % (ref 11.5–15.5)
WBC: 4.6 10*3/uL (ref 4.0–10.5)

## 2021-10-17 LAB — LIPID PANEL
Cholesterol: 143 mg/dL (ref 0–200)
HDL: 45.9 mg/dL (ref >39.00)
LDL Cholesterol: 79 mg/dL (ref 0–99)
NonHDL: 97.53
Total CHOL/HDL Ratio: 3
Triglycerides: 94 mg/dL (ref 0.0–149.0)
VLDL: 18.8 mg/dL (ref 0.0–40.0)

## 2021-10-17 LAB — HEMOGLOBIN A1C: Hgb A1c MFr Bld: 5.8 % (ref 4.6–6.5)

## 2021-10-17 LAB — B12 AND FOLATE PANEL
Folate: >24.2 ng/mL (ref >5.9)
Vitamin B-12: 468 pg/mL (ref 211–911)

## 2021-10-19 ENCOUNTER — Ambulatory Visit (HOSPITAL_BASED_OUTPATIENT_CLINIC_OR_DEPARTMENT_OTHER)
Admission: RE | Admit: 2021-10-19 | Discharge: 2021-10-19 | Disposition: A | Discharge status: Home / Self Care | Payer: Medicare Other | Source: Ambulatory Visit | Attending: Family Medicine | Admitting: Family Medicine

## 2021-10-19 ENCOUNTER — Encounter: Payer: Self-pay | Admitting: Family Medicine

## 2021-10-19 DIAGNOSIS — E785 Hyperlipidemia, unspecified: Secondary | ICD-10-CM | POA: Insufficient documentation

## 2021-10-21 ENCOUNTER — Other Ambulatory Visit: Payer: Self-pay | Admitting: Family Medicine

## 2021-10-21 ENCOUNTER — Encounter: Payer: Self-pay | Admitting: Family Medicine

## 2021-10-21 DIAGNOSIS — I712 Thoracic aortic aneurysm, without rupture, unspecified: Secondary | ICD-10-CM | POA: Insufficient documentation

## 2021-10-21 DIAGNOSIS — G43009 Migraine without aura, not intractable, without status migrainosus: Secondary | ICD-10-CM

## 2021-10-21 DIAGNOSIS — M19141 Post-traumatic osteoarthritis, right hand: Secondary | ICD-10-CM

## 2021-10-21 DIAGNOSIS — I7121 Aneurysm of the ascending aorta, without rupture: Secondary | ICD-10-CM

## 2021-10-21 DIAGNOSIS — E785 Hyperlipidemia, unspecified: Secondary | ICD-10-CM

## 2021-10-21 MED ORDER — SUMATRIPTAN SUCCINATE 50 MG PO TABS: 50.0000 mg | ORAL_TABLET | ORAL | 6 refills | Status: DC | PRN

## 2021-10-22 NOTE — Addendum Note (Signed)
Addended by: Lamar Blinks C on: 10/22/2021 10:34 AM   Modules accepted: Orders

## 2021-11-01 MED ORDER — ROSUVASTATIN CALCIUM 20 MG PO TABS: 20.0000 mg | ORAL_TABLET | Freq: Every day | ORAL | 3 refills | Status: DC

## 2021-11-01 NOTE — Addendum Note (Signed)
Addended by: Lamar Blinks C on: 11/01/2021 12:43 PM   Modules accepted: Orders

## 2021-11-09 DIAGNOSIS — M65331 Trigger finger, right middle finger: Secondary | ICD-10-CM | POA: Diagnosis not present

## 2021-11-09 DIAGNOSIS — M13841 Other specified arthritis, right hand: Secondary | ICD-10-CM | POA: Diagnosis not present

## 2021-11-29 ENCOUNTER — Telehealth: Payer: Self-pay | Admitting: Family Medicine

## 2021-11-29 NOTE — Telephone Encounter (Signed)
Johnny Davenport (spouse DPR OK) called asking if she has gotten the 2 shot series for the pneumonia vaccine, does she need to get anything else in that regard to help against it. Please Advise.

## 2021-11-29 NOTE — Telephone Encounter (Signed)
Pneumo23: 12/10/18, [id number]: 11/09/21: please advise

## 2021-12-06 DIAGNOSIS — M65331 Trigger finger, right middle finger: Secondary | ICD-10-CM | POA: Diagnosis not present

## 2021-12-06 DIAGNOSIS — M13849 Other specified arthritis, unspecified hand: Secondary | ICD-10-CM | POA: Diagnosis not present

## 2022-01-16 ENCOUNTER — Encounter: Payer: Self-pay | Admitting: Family Medicine

## 2022-01-16 DIAGNOSIS — K219 Gastro-esophageal reflux disease without esophagitis: Secondary | ICD-10-CM

## 2022-01-16 MED ORDER — OMEPRAZOLE 20 MG PO CPDR: 20.0000 mg | DELAYED_RELEASE_CAPSULE | Freq: Every day | ORAL | 3 refills | Status: DC | PRN

## 2022-01-22 DIAGNOSIS — H04123 Dry eye syndrome of bilateral lacrimal glands: Secondary | ICD-10-CM | POA: Diagnosis not present

## 2022-02-04 ENCOUNTER — Encounter: Payer: Self-pay | Admitting: Family Medicine

## 2022-02-04 DIAGNOSIS — R0981 Nasal congestion: Secondary | ICD-10-CM

## 2022-02-12 ENCOUNTER — Emergency Department (HOSPITAL_BASED_OUTPATIENT_CLINIC_OR_DEPARTMENT_OTHER)
Admission: EM | Admit: 2022-02-12 | Discharge: 2022-02-12 | Disposition: A | Discharge status: Home / Self Care | Payer: Medicare Other | Attending: Emergency Medicine | Admitting: Emergency Medicine

## 2022-02-12 ENCOUNTER — Encounter (HOSPITAL_BASED_OUTPATIENT_CLINIC_OR_DEPARTMENT_OTHER): Payer: Self-pay

## 2022-02-12 ENCOUNTER — Emergency Department (HOSPITAL_BASED_OUTPATIENT_CLINIC_OR_DEPARTMENT_OTHER): Payer: Medicare Other

## 2022-02-12 DIAGNOSIS — S65507A Unspecified injury of blood vessel of left little finger, initial encounter: Secondary | ICD-10-CM | POA: Diagnosis present

## 2022-02-12 DIAGNOSIS — S62637A Displaced fracture of distal phalanx of left little finger, initial encounter for closed fracture: Secondary | ICD-10-CM | POA: Diagnosis not present

## 2022-02-12 DIAGNOSIS — S62607A Fracture of unspecified phalanx of left little finger, initial encounter for closed fracture: Secondary | ICD-10-CM | POA: Insufficient documentation

## 2022-02-12 DIAGNOSIS — X58XXXA Exposure to other specified factors, initial encounter: Secondary | ICD-10-CM | POA: Diagnosis not present

## 2022-02-12 DIAGNOSIS — S62667A Nondisplaced fracture of distal phalanx of left little finger, initial encounter for closed fracture: Secondary | ICD-10-CM | POA: Diagnosis not present

## 2022-02-12 NOTE — ED Triage Notes (Signed)
States jammed left 5th finger on oven Thursday. Had worsening pain last night. Wants to check for fractures

## 2022-02-12 NOTE — ED Provider Notes (Signed)
[facility] Provider Note   CSN: [id number] Arrival date & time: 02/12/22  6720     History  Chief Complaint  Patient presents with   Finger Injury    Johnny Davenport is a 71 y.o. male.  Patient with pain to his left pinky finger that he injured a few days ago.  Still having pain.  No significant medical problems.  Nothing makes it worse or better except for movement.  Denies any other pain.  The history is provided by the patient.       Home Medications Prior to Admission medications   Medication Sig Start Date End Date Taking? Authorizing Provider  omeprazole (PRILOSEC) 20 MG capsule Take 1 capsule (20 mg total) by mouth daily as needed. 01/16/22   Copland, Gay Filler, MD  rosuvastatin (CRESTOR) 20 MG tablet Take 1 tablet (20 mg total) by mouth daily. 11/01/21   Copland, Gay Filler, MD  silodosin (RAPAFLO) 8 MG CAPS capsule Take 8 mg by mouth daily. 10/03/21   [provider]  SUMAtriptan (IMITREX) 50 MG tablet Take 1 tablet (50 mg total) by mouth every 2 (two) hours as needed for migraine. Max 100 mg in 24 hours 10/21/21   Copland, Gay Filler, MD      Allergies    Other    Review of Systems   Review of Systems  Physical Exam Updated Vital Signs BP 127/81 (BP Location: Right Arm)   Pulse (!) 58   Temp 97.8 F (36.6 C) (Oral)   Resp 18   Ht 6' (1.829 m)   Wt 83.9 kg   SpO2 100%   BMI 25.09 kg/m  Physical Exam Cardiovascular:     Pulses: Normal pulses.  Musculoskeletal:        General: Tenderness present.     Comments: Tenderness to the base of left pinky, no obvious deformity  Skin:    General: Skin is warm.  Neurological:     General: No focal deficit present.     Mental Status: He is alert.     Sensory: No sensory deficit.     Motor: No weakness.     ED Results / Procedures / Treatments   Labs (all labs ordered are listed, but only abnormal results are displayed) Labs Reviewed - No data to  display  EKG None  Radiology DG Finger Little Left  Result Date: 02/12/2022 CLINICAL DATA:  Left fifth finger injury 5 days ago with worsening pain. EXAM: LEFT LITTLE FINGER 2+V COMPARISON:  None Available. FINDINGS: Examination demonstrates a subtle fracture involving the base of the fifth distal phalanx without significant displacement. Possible minimal degenerative changes over the interphalangeal joints. Remainder of the exam is unremarkable. IMPRESSION: Subtle fracture of the base of the fifth distal phalanx without significant displacement. Electronically Signed   By: Marin Olp M.D.   On: 02/12/2022 08:06    Procedures Procedures    Medications Ordered in ED Medications - No data to display  ED Course/ Medical Decision Making/ A&P                           Medical Decision Making Amount and/or Complexity of Data Reviewed Radiology: ordered.   Johnny Davenport is here with pain to his left pinky.  Differential fracture versus sprain.  X-ray per radiology report shows subtle fracture at the base of the fifth distal phalanx without significant displacement.  Placed in a static splint.  Will have  him follow-up with hand.  Recommend ice, Tylenol, ibuprofen.  Discharged in good condition.  This chart was dictated using voice recognition software.  Despite best efforts to proofread,  errors can occur which can change the documentation meaning.         Final Clinical Impression(s) / ED Diagnoses Final diagnoses:  Closed nondisplaced fracture of phalanx of left little finger, unspecified phalanx, initial encounter    Rx / DC Orders ED Discharge Orders     None         Lennice Sites, DO 02/12/22 0825

## 2022-02-12 NOTE — ED Notes (Signed)
Discharge instructions reviewed with patient. Patient verbalizes understanding, no further questions at this time. Medications/prescriptions and follow up information provided. No acute distress noted at time of departure.  

## 2022-03-02 NOTE — Patient Instructions (Incomplete)
It was good to see you again today If not done already considered the latest COVID-19 booster and a dose of RSV   Please stop by [facility] at [facility] and get an x-ray of your finger today - this will help [place] determine how the fracture is healing.  Leave the splint on for now!

## 2022-03-02 NOTE — Progress Notes (Unsigned)
[place] at [facility] [address], [place], [place] [address] [phone]  Date:  03/06/2022   Name:  Johnny Davenport   DOB:  [birth year]   MRN:  [id number]  PCP:  Darreld Mclean, MD    Chief Complaint: No chief complaint on file.   History of Present Illness:  Johnny Davenport is a 71 y.o. very pleasant male patient who presents with the following:  Patient seen today to follow-up a finger fracture-history of BPH, gerd, hyperlipidemia  Most recent visit with myself was in August He was seen in the ER on 12/5 with a finger fracture-left small finger-it looks like the ER put him in a splint and planned a referral to hand surgery  Can recommend latest COVID booster Patient Active Problem List   Diagnosis Date Noted   Aneurysm of thoracic aorta ([place]) 10/21/2021   Lateral epicondylitis, left elbow 11/16/2020   Arthritis of carpometacarpal [facility]) joint of right thumb 11/16/2020   Dyslipidemia 09/30/2020    Past Medical History:  Diagnosis Date   Allergy    seasonal allergies   Arthritis    bilateral thumbs   BPH (benign prostatic hyperplasia)    on meds   GERD (gastroesophageal reflux disease)    with certain foods/OTC meds for tx   Hyperlipidemia    on meds    Past Surgical History:  Procedure Laterality Date   COLONOSCOPY  2016   TA/hems-5 yr recall   KNEE ARTHROSCOPY W/ MENISCAL REPAIR Right    x 2   POLYPECTOMY  2016   TA   ROTATOR CUFF REPAIR Left    TONSILLECTOMY     WISDOM TOOTH EXTRACTION      Social History   Tobacco Use   Smoking status: Never   Smokeless tobacco: Never  Vaping Use   Vaping Use: Never used  Substance Use Topics   Alcohol use: Yes    Alcohol/week: 1.0 standard drink of alcohol    Types: 1 Standard drinks or equivalent per week   Drug use: No    Family History  Problem Relation Age of Onset   Stroke Mother 48   Healthy Sister    Healthy Sister    Colon cancer Neg Hx     Colon polyps Neg Hx    Esophageal cancer Neg Hx    Stomach cancer Neg Hx    Rectal cancer Neg Hx     Allergies  Allergen Reactions   Other Other (See Comments) and Cough    Pollen From Flowers    Medication list has been reviewed and updated.  Current Outpatient Medications on File Prior to Visit  Medication Sig Dispense Refill   omeprazole (PRILOSEC) 20 MG capsule Take 1 capsule (20 mg total) by mouth daily as needed. 90 capsule 3   rosuvastatin (CRESTOR) 20 MG tablet Take 1 tablet (20 mg total) by mouth daily. 90 tablet 3   silodosin (RAPAFLO) 8 MG CAPS capsule Take 8 mg by mouth daily.     SUMAtriptan (IMITREX) 50 MG tablet Take 1 tablet (50 mg total) by mouth every 2 (two) hours as needed for migraine. Max 100 mg in 24 hours 10 tablet 6   No current facility-administered medications on file prior to visit.    Review of Systems:  As per HPI- otherwise negative.   Physical Examination: There were no vitals filed for this visit. There were no vitals filed for this visit. There is no  height or weight on file to calculate BMI. Ideal Body Weight:    GEN: no acute distress. HEENT: Atraumatic, Normocephalic.  Ears and Nose: No external deformity. CV: RRR, No M/G/R. No JVD. No thrill. No extra heart sounds. PULM: CTA B, no wheezes, crackles, rhonchi. No retractions. No resp. distress. No accessory muscle use. ABD: S, NT, ND, +BS. No rebound. No HSM. EXTR: No c/c/e PSYCH: Normally interactive. Conversant.    Assessment and Plan: ***  Signed Lamar Blinks, MD

## 2022-03-06 ENCOUNTER — Ambulatory Visit (INDEPENDENT_AMBULATORY_CARE_PROVIDER_SITE_OTHER): Payer: Medicare Other | Admitting: Family Medicine

## 2022-03-06 ENCOUNTER — Encounter: Payer: Self-pay | Admitting: Family Medicine

## 2022-03-06 ENCOUNTER — Ambulatory Visit
Admission: RE | Admit: 2022-03-06 | Discharge: 2022-03-06 | Disposition: A | Discharge status: Home / Self Care | Payer: Medicare Other | Source: Ambulatory Visit | Attending: Family Medicine | Admitting: Family Medicine

## 2022-03-06 VITALS — BP 132/80 | HR 71 | Temp 97.7°F | Resp 18 | Ht 72.0 in | Wt 188.8 lb

## 2022-03-06 DIAGNOSIS — S62667D Nondisplaced fracture of distal phalanx of left little finger, subsequent encounter for fracture with routine healing: Secondary | ICD-10-CM | POA: Diagnosis not present

## 2022-03-06 DIAGNOSIS — S62667A Nondisplaced fracture of distal phalanx of left little finger, initial encounter for closed fracture: Secondary | ICD-10-CM | POA: Diagnosis not present

## 2022-03-15 DIAGNOSIS — J309 Allergic rhinitis, unspecified: Secondary | ICD-10-CM | POA: Diagnosis not present

## 2022-04-05 DIAGNOSIS — R350 Frequency of micturition: Secondary | ICD-10-CM | POA: Diagnosis not present

## 2022-04-21 ENCOUNTER — Emergency Department (HOSPITAL_COMMUNITY)
Admission: EM | Admit: 2022-04-21 | Discharge: 2022-04-22 | Disposition: A | Discharge status: Home / Self Care | Payer: Medicare Other | Attending: Emergency Medicine | Admitting: Emergency Medicine

## 2022-04-21 ENCOUNTER — Other Ambulatory Visit: Payer: Self-pay

## 2022-04-21 DIAGNOSIS — T17920A Food in respiratory tract, part unspecified causing asphyxiation, initial encounter: Secondary | ICD-10-CM | POA: Insufficient documentation

## 2022-04-21 DIAGNOSIS — R110 Nausea: Secondary | ICD-10-CM | POA: Diagnosis not present

## 2022-04-21 DIAGNOSIS — K5730 Diverticulosis of large intestine without perforation or abscess without bleeding: Secondary | ICD-10-CM | POA: Diagnosis not present

## 2022-04-21 DIAGNOSIS — Y844 Aspiration of fluid as the cause of abnormal reaction of the patient, or of later complication, without mention of misadventure at the time of the procedure: Secondary | ICD-10-CM | POA: Insufficient documentation

## 2022-04-21 DIAGNOSIS — R748 Abnormal levels of other serum enzymes: Secondary | ICD-10-CM | POA: Insufficient documentation

## 2022-04-21 DIAGNOSIS — R404 Transient alteration of awareness: Secondary | ICD-10-CM | POA: Diagnosis not present

## 2022-04-21 DIAGNOSIS — R42 Dizziness and giddiness: Secondary | ICD-10-CM | POA: Diagnosis not present

## 2022-04-21 DIAGNOSIS — T17908A Unspecified foreign body in respiratory tract, part unspecified causing other injury, initial encounter: Secondary | ICD-10-CM

## 2022-04-21 DIAGNOSIS — Z743 Need for continuous supervision: Secondary | ICD-10-CM | POA: Diagnosis not present

## 2022-04-21 DIAGNOSIS — R55 Syncope and collapse: Secondary | ICD-10-CM | POA: Insufficient documentation

## 2022-04-21 DIAGNOSIS — R112 Nausea with vomiting, unspecified: Secondary | ICD-10-CM | POA: Diagnosis not present

## 2022-04-21 DIAGNOSIS — R1110 Vomiting, unspecified: Secondary | ICD-10-CM | POA: Diagnosis not present

## 2022-04-21 DIAGNOSIS — R739 Hyperglycemia, unspecified: Secondary | ICD-10-CM | POA: Insufficient documentation

## 2022-04-21 DIAGNOSIS — N281 Cyst of kidney, acquired: Secondary | ICD-10-CM | POA: Diagnosis not present

## 2022-04-21 MED ORDER — SODIUM CHLORIDE 0.9 % IV BOLUS (SEPSIS)
1000.0000 mL | Freq: Once | INTRAVENOUS | Status: AC
  Administered 2022-04-21: 1000 mL via INTRAVENOUS

## 2022-04-21 MED ORDER — SODIUM CHLORIDE 0.9 % IV SOLN
1000.0000 mL | INTRAVENOUS | Status: DC
  Administered 2022-04-21: 1000 mL via INTRAVENOUS

## 2022-04-21 MED ORDER — ONDANSETRON HCL 4 MG/2ML IJ SOLN
4.0000 mg | Freq: Once | INTRAMUSCULAR | Status: AC
  Administered 2022-04-21: 4 mg via INTRAVENOUS
  Filled 2022-04-21: qty 2

## 2022-04-21 MED ORDER — PANTOPRAZOLE SODIUM 40 MG IV SOLR
40.0000 mg | Freq: Once | INTRAVENOUS | Status: AC
  Administered 2022-04-21: 40 mg via INTRAVENOUS
  Filled 2022-04-21: qty 10

## 2022-04-21 NOTE — ED Triage Notes (Signed)
Pt arrives from home via [facility] with c/o earlier tonight, he had pizza, then about 30 minutes later had episodes of n/v. Around 2230 he had a syncopal episode, his wife assisted him to the floor. On arrival by EMS, the pt was a/o c/o abdominal pain, initial bp 110/77. Moved to toilet he became  diaphoretic, pale, 77/40, has been given 700 cc fluid, last 110/66. Nsr 60's 97% on room air. CBG 124.

## 2022-04-21 NOTE — ED Provider Notes (Signed)
[facility] Provider Note  CSN: [id number] Arrival date & time: 04/21/22 2335  Chief Complaint(s) Loss of Consciousness  HPI Johnny Davenport is a 72 y.o. male {Add pertinent medical, surgical, social history, OB history to HPI:1}    Loss of Consciousness   Past Medical History Past Medical History:  Diagnosis Date   Allergy    seasonal allergies   Arthritis    bilateral thumbs   BPH (benign prostatic hyperplasia)    on meds   GERD (gastroesophageal reflux disease)    with certain foods/OTC meds for tx   Hyperlipidemia    on meds   Patient Active Problem List   Diagnosis Date Noted   Aneurysm of thoracic aorta ([place]) 10/21/2021   Lateral epicondylitis, left elbow 11/16/2020   Arthritis of carpometacarpal [facility]) joint of right thumb 11/16/2020   Dyslipidemia 09/30/2020   Home Medication(s) Prior to Admission medications   Medication Sig Start Date End Date Taking? Authorizing Provider  omeprazole (PRILOSEC) 20 MG capsule Take 1 capsule (20 mg total) by mouth daily as needed. 01/16/22   Copland, Gay Filler, MD  rosuvastatin (CRESTOR) 20 MG tablet Take 1 tablet (20 mg total) by mouth daily. 11/01/21   Copland, Gay Filler, MD  silodosin (RAPAFLO) 8 MG CAPS capsule Take 8 mg by mouth daily. 10/03/21   [provider]  SUMAtriptan (IMITREX) 50 MG tablet Take 1 tablet (50 mg total) by mouth every 2 (two) hours as needed for migraine. Max 100 mg in 24 hours 10/21/21   Copland, Gay Filler, MD                                                                                                                                    Allergies Other  Review of Systems Review of Systems  Cardiovascular:  Positive for syncope.   As noted in HPI  Physical Exam Vital Signs  I have reviewed the triage vital signs BP 110/76 (BP Location: Right Arm)   Pulse (!) 58   Temp (!) 97.2 F (36.2 C) (Oral)   Resp 16   Ht 6' (1.829 m)   Wt 85 kg   SpO2  96%   BMI 25.41 kg/m  *** Physical Exam  ED Results and Treatments Labs (all labs ordered are listed, but only abnormal results are displayed) Labs Reviewed  CBC  LIPASE, BLOOD  COMPREHENSIVE METABOLIC PANEL  EKG  EKG Interpretation  Date/Time:  Sunday April 21 2022 23:41:41 EST Ventricular Rate:  63 PR Interval:  157 QRS Duration: 98 QT Interval:  443 QTC Calculation: [id number] R Axis:   58 Text Interpretation: Sinus rhythm RSR' in V1 or V2, right VCD or RVH Artifact No old tracing to compare Confirmed by Addison Lank [phone]) on 04/21/2022 11:54:01 PM       Radiology No results found.  Medications Ordered in ED Medications  sodium chloride 0.9 % bolus 1,000 mL (1,000 mLs Intravenous New Bag/Given 04/21/22 2353)    Followed by  0.9 %  sodium chloride infusion (1,000 mLs Intravenous New Bag/Given 04/21/22 2353)  ondansetron (ZOFRAN) injection 4 mg (4 mg Intravenous Given 04/21/22 2353)  pantoprazole (PROTONIX) injection 40 mg (40 mg Intravenous Given 04/21/22 2355)                                                                                                                                     Procedures Procedures  (including critical care time)  Medical Decision Making / ED Course   Medical Decision Making Amount and/or Complexity of Data Reviewed Labs: ordered. Radiology: ordered.  Risk Prescription drug management.          Final Clinical Impression(s) / ED Diagnoses Final diagnoses:  None    {Document critical care time when appropriate:1}  {Document review of labs and clinical decision tools ie heart score, Chads2Vasc2 etc:1}  {Document your independent review of radiology images, and any outside records:1} {Document your discussion with family members, caretakers, and with consultants:1} {Document social determinants of health  affecting pt's care:1} {Document your decision making why or why not admission, treatments were needed:1} This chart was dictated using voice recognition software.  Despite best efforts to proofread,  errors can occur which can change the documentation meaning.

## 2022-04-22 ENCOUNTER — Emergency Department (HOSPITAL_COMMUNITY): Payer: Medicare Other

## 2022-04-22 DIAGNOSIS — K5730 Diverticulosis of large intestine without perforation or abscess without bleeding: Secondary | ICD-10-CM | POA: Diagnosis not present

## 2022-04-22 DIAGNOSIS — R55 Syncope and collapse: Secondary | ICD-10-CM | POA: Diagnosis not present

## 2022-04-22 DIAGNOSIS — N281 Cyst of kidney, acquired: Secondary | ICD-10-CM | POA: Diagnosis not present

## 2022-04-22 DIAGNOSIS — R1110 Vomiting, unspecified: Secondary | ICD-10-CM | POA: Diagnosis not present

## 2022-04-22 LAB — COMPREHENSIVE METABOLIC PANEL
ALT: 33 U/L (ref 0–44)
AST: 33 U/L (ref 15–41)
Albumin: 3.7 g/dL (ref 3.5–5.0)
Alkaline Phosphatase: 50 U/L (ref 38–126)
Anion gap: 9 (ref 5–15)
BUN: 24 mg/dL — ABNORMAL HIGH (ref 8–23)
CO2: 23 mmol/L (ref 22–32)
Calcium: 8.4 mg/dL — ABNORMAL LOW (ref 8.9–10.3)
Chloride: 108 mmol/L (ref 98–111)
Creatinine, Ser: 1.16 mg/dL (ref 0.61–1.24)
GFR, Estimated: >60 mL/min (ref >60)
Glucose, Bld: 132 mg/dL — ABNORMAL HIGH (ref 70–99)
Potassium: 4.5 mmol/L (ref 3.5–5.1)
Sodium: 140 mmol/L (ref 135–145)
Total Bilirubin: 0.6 mg/dL (ref 0.3–1.2)
Total Protein: 6.1 g/dL — ABNORMAL LOW (ref 6.5–8.1)

## 2022-04-22 LAB — CBC
HCT: 41.2 % (ref 39.0–52.0)
Hemoglobin: 13.6 g/dL (ref 13.0–17.0)
MCH: 32.5 pg (ref 26.0–34.0)
MCHC: 33 g/dL (ref 30.0–36.0)
MCV: 98.6 fL (ref 80.0–100.0)
Platelets: 164 10*3/uL (ref 150–400)
RBC: 4.18 MIL/uL — ABNORMAL LOW (ref 4.22–5.81)
RDW: 12.5 % (ref 11.5–15.5)
WBC: 9.5 10*3/uL (ref 4.0–10.5)
nRBC: 0 % (ref 0.0–0.2)

## 2022-04-22 LAB — LIPASE, BLOOD: Lipase: 101 U/L — ABNORMAL HIGH (ref 11–51)

## 2022-04-22 MED ORDER — IOHEXOL 350 MG/ML SOLN
75.0000 mL | Freq: Once | INTRAVENOUS | Status: AC | PRN
  Administered 2022-04-22: 75 mL via INTRAVENOUS

## 2022-04-22 MED ORDER — METRONIDAZOLE 500 MG PO TABS: 500.0000 mg | ORAL_TABLET | Freq: Two times a day (BID) | ORAL | 0 refills | Status: DC

## 2022-04-22 MED ORDER — ONDANSETRON HCL 4 MG/2ML IJ SOLN
4.0000 mg | Freq: Once | INTRAMUSCULAR | Status: AC
  Administered 2022-04-22: 4 mg via INTRAVENOUS
  Filled 2022-04-22: qty 2

## 2022-04-22 MED ORDER — ONDANSETRON 4 MG PO TBDP: 4.0000 mg | ORAL_TABLET | Freq: Three times a day (TID) | ORAL | 0 refills | Status: AC | PRN

## 2022-04-22 MED ORDER — AMOXICILLIN-POT CLAVULANATE 875-125 MG PO TABS: 1.0000 | ORAL_TABLET | Freq: Two times a day (BID) | ORAL | 0 refills | Status: DC

## 2022-04-22 NOTE — ED Notes (Signed)
Pt ambulated to bathroom with assistance.

## 2022-04-22 NOTE — ED Notes (Signed)
Pt ambulated to restroom, while in bathroom, started vomited, c/o some dizziness.  Additional dose for zofran obtained from dr. Leonette Monarch. Pt taken back to room in w/c

## 2022-04-22 NOTE — ED Notes (Signed)
ED Provider at bedside. 

## 2022-04-22 NOTE — Discharge Instructions (Addendum)
You likely aspirated while vomiting/passing out. This should resolve on its own. Please use the incentive spirometer as directed below. If you develop a fever with worsening cough or shortness of breath after 3-5 days, you can start taking the antibiotics prescribed to treat possible aspiration pneumonia.  Incentive spirometer: Use frequently to prevent pneumonia.  Recommended usage: 10 deep inhalations through spirometer every 2-3 hours for 7-10 days.

## 2022-04-22 NOTE — ED Notes (Signed)
Patient transported to CT 

## 2022-04-23 ENCOUNTER — Encounter: Payer: Self-pay | Admitting: Family

## 2022-04-23 ENCOUNTER — Ambulatory Visit (INDEPENDENT_AMBULATORY_CARE_PROVIDER_SITE_OTHER): Payer: Medicare Other | Admitting: Family

## 2022-04-23 VITALS — BP 122/74 | HR 79 | Resp 18 | Ht 72.0 in | Wt 190.8 lb

## 2022-04-23 DIAGNOSIS — R748 Abnormal levels of other serum enzymes: Secondary | ICD-10-CM | POA: Diagnosis not present

## 2022-04-23 DIAGNOSIS — J690 Pneumonitis due to inhalation of food and vomit: Secondary | ICD-10-CM

## 2022-04-23 MED ORDER — AMOXICILLIN-POT CLAVULANATE 875-125 MG PO TABS: 1.0000 | ORAL_TABLET | Freq: Two times a day (BID) | ORAL | 0 refills | Status: DC

## 2022-04-23 NOTE — Progress Notes (Signed)
Jemari Hoard is a 72 y.o. male with the following history as recorded in EpicCare:  Patient Active Problem List   Diagnosis Date Noted   Aneurysm of thoracic aorta ([place]) 10/21/2021   Lateral epicondylitis, left elbow 11/16/2020   Arthritis of carpometacarpal [facility]) joint of right thumb 11/16/2020   Dyslipidemia 09/30/2020    Current Outpatient Medications  Medication Sig Dispense Refill   omeprazole (PRILOSEC) 20 MG capsule Take 1 capsule (20 mg total) by mouth daily as needed. 90 capsule 3   ondansetron (ZOFRAN-ODT) 4 MG disintegrating tablet Take 1 tablet (4 mg total) by mouth every 8 (eight) hours as needed for up to 3 days for nausea or vomiting. 15 tablet 0   rosuvastatin (CRESTOR) 20 MG tablet Take 1 tablet (20 mg total) by mouth daily. 90 tablet 3   silodosin (RAPAFLO) 8 MG CAPS capsule Take 8 mg by mouth daily.     SUMAtriptan (IMITREX) 50 MG tablet Take 1 tablet (50 mg total) by mouth every 2 (two) hours as needed for migraine. Max 100 mg in 24 hours 10 tablet 6   [START ON 04/24/2022] amoxicillin-clavulanate (AUGMENTIN) 875-125 MG tablet Take 1 tablet by mouth every 12 (twelve) hours for 10 days. 20 tablet 0   No current facility-administered medications for this visit.    Allergies: Other  Past Medical History:  Diagnosis Date   Allergy    seasonal allergies   Arthritis    bilateral thumbs   BPH (benign prostatic hyperplasia)    on meds   GERD (gastroesophageal reflux disease)    with certain foods/OTC meds for tx   Hyperlipidemia    on meds    Past Surgical History:  Procedure Laterality Date   COLONOSCOPY  2016   TA/hems-5 yr recall   KNEE ARTHROSCOPY W/ MENISCAL REPAIR Right    x 2   POLYPECTOMY  2016   TA   ROTATOR CUFF REPAIR Left    TONSILLECTOMY     WISDOM TOOTH EXTRACTION      Family History  Problem Relation Age of Onset   Stroke Mother 56   Healthy Sister    Healthy Sister    Colon cancer Neg Hx    Colon polyps Neg Hx    Esophageal cancer Neg Hx     Stomach cancer Neg Hx    Rectal cancer Neg Hx     Social History   Tobacco Use   Smoking status: Never   Smokeless tobacco: Never  Substance Use Topics   Alcohol use: Yes    Alcohol/week: 1.0 standard drink of alcohol    Types: 1 Standard drinks or equivalent per week    Subjective:   Wife accompanies to OV; Seen at ER on 2/11 with syncopal episode/ nausea and vomiting/ aspiration into airway; admits was eating rich foods for Superbow[employer] party- pizza, chili, Caesar salad; became violently ill; was taken to ER- abdominal CT was reassuring; CXR showed right lobular pneumonia; wife and patient confused as to why he was told to hold antibiotics; has been using spirometer; notes feeling "much better"- no further vomiting;   Objective:  Vitals:   04/23/22 1551  BP: 122/74  Pulse: 79  Resp: 18  SpO2: 97%  Weight: 190 lb 12.8 oz (86.5 kg)  Height: 6' (1.829 m)    General: Well developed, well nourished, in no acute distress  Skin : Warm and dry.  Head: Normocephalic and atraumatic  Lungs: Respirations unlabored; clear to auscultation bilaterally without wheeze, rales, rhonchi  CVS exam: normal rate and regular rhythm.  Abdomen: Soft; nontender; nondistended; normoactive bowel sounds; no masses or hepatosplenomegaly  Neurologic: Alert and oriented; speech intact; face symmetrical; moves all extremities well; CNII-XII intact without focal deficit   Assessment:  1. Aspiration pneumonia of right middle lobe, unspecified aspiration pneumonia type ([place])   2. Elevated lipase     Plan:  Physical exam is reassuring; will go ahead and start Augmentin x 10 days; plan to see his PCP in 2-3 weeks for follow up- labs and CXR can be re-done at that that time; follow up sooner if symptoms do not continue to improve;  Time spent 30 minutes reviewing ER notes/ discussing treatment plan  Return in about 3 weeks (around 05/14/2022) for Dr. Lorelei Pont.  No orders of the defined types were placed in this  encounter.   Requested Prescriptions   Signed Prescriptions Disp Refills   amoxicillin-clavulanate (AUGMENTIN) 875-125 MG tablet 20 tablet 0    Sig: Take 1 tablet by mouth every 12 (twelve) hours for 10 days.

## 2022-04-24 ENCOUNTER — Telehealth: Payer: Self-pay | Admitting: Family Medicine

## 2022-04-24 ENCOUNTER — Encounter: Payer: Self-pay | Admitting: Family

## 2022-04-24 NOTE — Telephone Encounter (Signed)
See mychart messages

## 2022-04-24 NOTE — Telephone Encounter (Signed)
Pt is needing help using his spirometer. Please advise.

## 2022-04-25 ENCOUNTER — Encounter: Payer: Self-pay | Admitting: Family Medicine

## 2022-05-05 NOTE — Progress Notes (Signed)
[place] at [facility] [address], [place], [place] [address] [id number] [phone]  Date:  05/13/2022   Name:  Johnny Davenport   DOB:  [birth year]   MRN:  [id number]  PCP:  Darreld Mclean, MD    Chief Complaint: 3 week f/u (Pt was seen by LM for [facility] F/u- Aspiration Pneumonia. Started on start Augmentin. Plan was to have follow up- labs and CXR today. Pt has completed the Augmentin. /Concerns/ questions: none)   History of Present Illness:  Johnny Davenport is a 72 y.o. very pleasant male patient who presents with the following:  Patient seen today for short-term follow-up from recent illness He was in the ER on February 11 with nausea and vomiting complicated by syncope No sick contacts at home- we are not sure if he had food poisoning or a viral infection  He was seen by my partner Jodi Mourning nurse practitioner on 2/13-they went over his chest x-ray /CT scan from the ER which did show possible aspiration, patient was started on Augmentin and given incentive spirometer.  Plan to follow-up with me today  CT abdomen pelvis dated 2/12; plain chest x-ray was read as negative IMPRESSION: 1. No evidence of bowel obstruction or inflammatory changes. 2. Patchy ground-glass infiltrates in the lung bases, densest in the right middle lobe, findings consistent with multilobar pneumonia. 3. Aortic atherosclerosis. 4. Prostatomegaly. 5. Diverticulosis without diverticulitis. 6. Subcentimeter too small to characterize hypodensities in the left lobe of the liver and left kidney.  He finished 10 days of Augmentin He notes he may have a little bit of a cough and may be clearing his throat some He is able to exercise normally now His GI system is ok since he finished up the Augmentin   Otherwise Johnny Davenport's only real concern is that his left fourth and fifth toe sometimes go numb at night.  He also notes he needs to be careful of which shoes he wears, tight shoes  will cause the toes to go numb.  As long as his shoes are comfortable he does not have any pain or numbness and no claudication symptoms -he does note a history of Morton's neuroma  He also had a prostate biopsy last week, will be going in for his results soon  Patient Active Problem List   Diagnosis Date Noted   Aneurysm of thoracic aorta ([place]) 10/21/2021   Lateral epicondylitis, left elbow 11/16/2020   Arthritis of carpometacarpal [facility]) joint of right thumb 11/16/2020   Dyslipidemia 09/30/2020    Past Medical History:  Diagnosis Date   Allergy    seasonal allergies   Arthritis    bilateral thumbs   BPH (benign prostatic hyperplasia)    on meds   GERD (gastroesophageal reflux disease)    with certain foods/OTC meds for tx   Hyperlipidemia    on meds    Past Surgical History:  Procedure Laterality Date   COLONOSCOPY  2016   TA/hems-5 yr recall   KNEE ARTHROSCOPY W/ MENISCAL REPAIR Right    x 2   POLYPECTOMY  2016   TA   ROTATOR CUFF REPAIR Left    TONSILLECTOMY     WISDOM TOOTH EXTRACTION      Social History   Tobacco Use   Smoking status: Never   Smokeless tobacco: Never  Vaping Use   Vaping Use: Never used  Substance Use Topics   Alcohol use: Yes    Alcohol/week: 1.0 standard  drink of alcohol    Types: 1 Standard drinks or equivalent per week   Drug use: No    Family History  Problem Relation Age of Onset   Stroke Mother 30   Healthy Sister    Healthy Sister    Colon cancer Neg Hx    Colon polyps Neg Hx    Esophageal cancer Neg Hx    Stomach cancer Neg Hx    Rectal cancer Neg Hx     Allergies  Allergen Reactions   Other Other (See Comments) and Cough    Pollen From Flowers    Medication list has been reviewed and updated.  Current Outpatient Medications on File Prior to Visit  Medication Sig Dispense Refill   omeprazole (PRILOSEC) 20 MG capsule Take 1 capsule (20 mg total) by mouth daily as needed. 90 capsule 3   rosuvastatin (CRESTOR) 20  MG tablet Take 1 tablet (20 mg total) by mouth daily. 90 tablet 3   silodosin (RAPAFLO) 8 MG CAPS capsule Take 8 mg by mouth daily.     SUMAtriptan (IMITREX) 50 MG tablet Take 1 tablet (50 mg total) by mouth every 2 (two) hours as needed for migraine. Max 100 mg in 24 hours 10 tablet 6   No current facility-administered medications on file prior to visit.    Review of Systems:  As per HPI- otherwise negative.   Physical Examination: Vitals:   05/13/22 0843  BP: 124/72  Pulse: 84  Resp: 18  Temp: 97.8 F (36.6 C)  SpO2: 98%   Vitals:   05/13/22 0843  Weight: 185 lb (83.9 kg)  Height: 6' (1.829 m)   Body mass index is 25.09 kg/m. Ideal Body Weight: Weight in (lb) to have BMI = 25: 183.9  GEN: no acute distress.  Looks well, fit and active for age 85: Atraumatic, Normocephalic.  Ears and Nose: No external deformity. CV: RRR, No M/G/R. No JVD. No thrill. No extra heart sounds. PULM: CTA B, no wheezes, crackles, rhonchi. No retractions. No resp. distress. No accessory muscle use. EXTR: No c/c/e PSYCH: Normally interactive. Conversant.  Foot exam-left foot appears normal-warm and well-perfused, strong pulses, normal hair growth, normal sensation  Assessment and Plan: Aspiration pneumonia, unspecified aspiration pneumonia type, unspecified laterality, unspecified part of lung ([place]) - Plan: DG Chest 2 View  Elevated glucose - Plan: Basic metabolic panel, Hemoglobin A1c  Following up today for recent aspiration pneumonia.  He is status posttreatment with Augmentin.  He is feeling basically back to normal.  Will plan to obtain a chest x-ray in 2 to 4 weeks to ensure his lungs are clear. Will also follow-up on mildly elevated BUN and mild prediabetes today Likely Morton's neuroma or similar in the left foot.  At this time his symptoms are minimal, he does not wish to pursue further evaluation or treatment right now  Signed Lamar Blinks, MD  Received labs as below,  message to patient Results for orders placed or performed in visit on [id number]  Basic metabolic panel  Result Value Ref Range   Sodium 141 135 - 145 mEq/L   Potassium 4.3 3.5 - 5.1 mEq/L   Chloride 103 96 - 112 mEq/L   CO2 31 19 - 32 mEq/L   Glucose, Bld 87 70 - 99 mg/dL   BUN 16 6 - 23 mg/dL   Creatinine, Ser 1.02 0.40 - 1.50 mg/dL   GFR 72.95 >60.00 mL/min   Calcium 9.4 8.4 - 10.5 mg/dL  Hemoglobin A1c  Result  Value Ref Range   Hgb A1c MFr Bld 5.9 4.6 - 6.5 %

## 2022-05-05 NOTE — Patient Instructions (Addendum)
It was good to see you again today!   Please let me know what urology has to say I will get labs today to check on your kidney function and blood sugar  Please stop by [place] [facility] at [place] in the next few weeks at your convenience to have a chest film  Take care!

## 2022-05-13 ENCOUNTER — Encounter: Payer: Self-pay | Admitting: Family Medicine

## 2022-05-13 ENCOUNTER — Ambulatory Visit (INDEPENDENT_AMBULATORY_CARE_PROVIDER_SITE_OTHER): Payer: Medicare Other | Admitting: Family Medicine

## 2022-05-13 VITALS — BP 124/72 | HR 84 | Temp 97.8°F | Resp 18 | Ht 72.0 in | Wt 185.0 lb

## 2022-05-13 DIAGNOSIS — R7309 Other abnormal glucose: Secondary | ICD-10-CM | POA: Diagnosis not present

## 2022-05-13 DIAGNOSIS — J690 Pneumonitis due to inhalation of food and vomit: Secondary | ICD-10-CM

## 2022-05-13 DIAGNOSIS — R7303 Prediabetes: Secondary | ICD-10-CM | POA: Diagnosis not present

## 2022-05-13 LAB — BASIC METABOLIC PANEL
BUN: 16 mg/dL (ref 6–23)
CO2: 31 mEq/L (ref 19–32)
Calcium: 9.4 mg/dL (ref 8.4–10.5)
Chloride: 103 mEq/L (ref 96–112)
Creatinine, Ser: 1.02 mg/dL (ref 0.40–1.50)
GFR: 72.95 mL/min (ref >60.00)
Glucose, Bld: 87 mg/dL (ref 70–99)
Potassium: 4.3 mEq/L (ref 3.5–5.1)
Sodium: 141 mEq/L (ref 135–145)

## 2022-05-13 LAB — HEMOGLOBIN A1C: Hgb A1c MFr Bld: 5.9 % (ref 4.6–6.5)

## 2022-05-23 ENCOUNTER — Encounter: Payer: Self-pay | Admitting: Radiation Oncology

## 2022-05-23 NOTE — Progress Notes (Signed)
GU Location of Tumor / Histology: Prostate Ca  If Prostate Cancer, Gleason Score is (3 + 4) and PSA is (7.31 on 03/2022)  Biopsies       Past/Anticipated interventions by urology, if any:  Dr. Milford Cage   Past/Anticipated interventions by medical oncology, if any: NA  Weight changes, if any:  No  IPSS:  13 SHIM:  22  Bowel/Bladder complaints, if any:  No  Nausea/Vomiting, if any: No  Pain issues, if any:  0/10  SAFETY ISSUES: Prior radiation?  No Pacemaker/ICD? No Possible current pregnancy? Male Is the patient on methotrexate? No  Current Complaints / other details:

## 2022-05-26 NOTE — Progress Notes (Signed)
Radiation Oncology         [phone] ________________________________  Initial Outpatient Consultation  Name: Johnny Davenport MRN: [id number]  Date: 05/27/2022  DOB: June 26, 1950  [id number], Gay Filler, MD  Remi Haggard, MD   REFERRING PHYSICIAN: Remi Haggard, MD  DIAGNOSIS: 72 y.o. 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    ICD-10-CM   1. Malignant neoplasm of prostate ([place])  C61       HISTORY OF PRESENT ILLNESS: Johnny Davenport is a 72 y.o. male with a diagnosis of prostate cancer. He was initially referred for evaluation in urology by Dr. Milford Cage in 2021 for a marginally elevated PSA of 4.25. They opted for PSA surveillance. His PSA fluctuated, reaching 5.62 in 03/2021, but dropped to 4.64 in 09/2021. More recently, his PSA rose significantly to 7.31 in 03/2022, digital rectal examination was performed at that time revealing a 10 mm left mid gland prostate nodule.  The patient proceeded to transrectal ultrasound with 12 biopsies of the prostate on 05/06/22.  The prostate volume measured 36.15 cc.  Out of 12 core biopsies, 4 were positive.  The maximum Gleason score was 3+4, and this was seen in left mid lateral, left apex lateral, and left mid (small focus). Additionally, Gleason 3+3 was seen in left apex.  The patient reviewed the biopsy results with his urologist and he has kindly been referred today for discussion of potential radiation treatment options.   PREVIOUS RADIATION THERAPY: No  PAST MEDICAL HISTORY:  Past Medical History:  Diagnosis Date   Allergy    seasonal allergies   Arthritis    bilateral thumbs   BPH (benign prostatic hyperplasia)    on meds   Dysuria 12/28/2019   Elevated PSA    GERD (gastroesophageal reflux disease)    with certain foods/OTC meds for tx   Hyperlipidemia    on meds   Incomplete bladder emptying 12/28/2019   11/22/2019      PAST SURGICAL HISTORY: Past Surgical History:   Procedure Laterality Date   COLONOSCOPY  2016   TA/hems-5 yr recall   Hand/Finger surgery  1997   KNEE ARTHROSCOPY W/ MENISCAL REPAIR Right    x 2   POLYPECTOMY  2016   TA   PROSTATE BIOPSY     ROTATOR CUFF REPAIR Left    TONSILLECTOMY     WISDOM TOOTH EXTRACTION      FAMILY HISTORY:  Family History  Problem Relation Age of Onset   Stroke Mother 42   Healthy Sister    Healthy Sister    Colon cancer Neg Hx    Colon polyps Neg Hx    Esophageal cancer Neg Hx    Stomach cancer Neg Hx    Rectal cancer Neg Hx     SOCIAL HISTORY:  Social History   Socioeconomic History   Marital status: Married    Spouse name: Not on file   Number of children: Not on file   Years of education: Not on file   Highest education level: Not on file  Occupational History   Not on file  Tobacco Use   Smoking status: Never   Smokeless tobacco: Never  Vaping Use   Vaping Use: Never used  Substance and Sexual Activity   Alcohol use: Yes    Alcohol/week: 1.0 standard drink of alcohol    Types: 1 Standard drinks or equivalent per week   Drug use: No   Sexual  activity: Not on file  Other Topics Concern   Not on file  Social History Narrative   Not on file   Social Determinants of Health   Financial Resource Strain: Low Risk  (09/10/2021)   Overall Financial Resource Strain (CARDIA)    Difficulty of Paying Living Expenses: Not hard at all  Food Insecurity: No Food Insecurity (05/27/2022)   Hunger Vital Sign    Worried About Running Out of Food in the Last Year: Never true    Ran Out of Food in the Last Year: Never true  Transportation Needs: No Transportation Needs (05/27/2022)   PRAPARE - Hydrologist (Medical): No    Lack of Transportation (Non-Medical): No  Physical Activity: Sufficiently Active (09/10/2021)   Exercise Vital Sign    Days of Exercise per Week: 7 days    Minutes of Exercise per Session: 30 min  Stress: No Stress Concern Present (09/10/2021)    [place]    Feeling of Stress : Not at all  Social Connections: Moderately Integrated (09/04/2020)   Social Connection and Isolation Panel [NHANES]    Frequency of Communication with Friends and Family: More than three times a week    Frequency of Social Gatherings with Friends and Family: More than three times a week    Attends Religious Services: Never    Marine scientist or Organizations: Yes    Attends Music therapist: More than 4 times per year    Marital Status: Married  Human resources officer Violence: Not At Risk (05/27/2022)   Humiliation, Afraid, Rape, and Kick questionnaire    Fear of Current or Ex-Partner: No    Emotionally Abused: No    Physically Abused: No    Sexually Abused: No    ALLERGIES: Other  MEDICATIONS:  Current Outpatient Medications  Medication Sig Dispense Refill   omeprazole (PRILOSEC) 20 MG capsule Take 1 capsule (20 mg total) by mouth daily as needed. 90 capsule 3   rosuvastatin (CRESTOR) 20 MG tablet Take 1 tablet (20 mg total) by mouth daily. 90 tablet 3   silodosin (RAPAFLO) 8 MG CAPS capsule Take 8 mg by mouth daily.     SUMAtriptan (IMITREX) 50 MG tablet Take 1 tablet (50 mg total) by mouth every 2 (two) hours as needed for migraine. Max 100 mg in 24 hours 10 tablet 6   No current facility-administered medications for this encounter.    REVIEW OF SYSTEMS:  On review of systems, the patient reports that he is doing well overall. He denies any chest pain, shortness of breath, cough, fevers, chills, night sweats, unintended weight changes. He denies any bowel disturbances, and denies abdominal pain, nausea or vomiting. He denies any new musculoskeletal or joint aches or pains. His IPSS was 13, indicating moderate urinary symptoms. His SHIM was 22, indicating he does not have erectile dysfunction. A complete review of systems is obtained and is otherwise negative.     PHYSICAL EXAM:  Wt Readings from Last 3 Encounters:  05/27/22 183 lb 6.4 oz (83.2 kg)  05/13/22 185 lb (83.9 kg)  04/23/22 190 lb 12.8 oz (86.5 kg)   Temp Readings from Last 3 Encounters:  05/27/22 97.7 F (36.5 C)  05/13/22 97.8 F (36.6 C) (Temporal)  04/22/22 99.5 F (37.5 C) (Temporal)   BP Readings from Last 3 Encounters:  05/27/22 (!) 154/89  05/13/22 124/72  04/23/22 122/74   Pulse Readings from Last 3  Encounters:  05/27/22 77  05/13/22 84  04/23/22 79   Pain Assessment Pain Score: 0-No pain/10  In general this is a well appearing  male in no acute distress. He's alert and oriented x4 and appropriate throughout the examination. Cardiopulmonary assessment is negative for acute distress, and he exhibits normal effort.     KPS = 100  100 - Normal; no complaints; no evidence of disease. 90   - Able to carry on normal activity; minor signs or symptoms of disease. 80   - Normal activity with effort; some signs or symptoms of disease. 104   - Cares for self; unable to carry on normal activity or to do active work. 60   - Requires occasional assistance, but is able to care for most of his personal needs. 50   - Requires considerable assistance and frequent medical care. 64   - Disabled; requires special care and assistance. 38   - Severely disabled; hospital admission is indicated although death not imminent. 83   - Very sick; hospital admission necessary; active supportive treatment necessary. 10   - Moribund; fatal processes progressing rapidly. 0     - Dead  Karnofsky DA, Abelmann [place], Craver LS and Burchenal [facility] [phone]) The use of the nitrogen mustards in the palliative treatment of carcinoma: with particular reference to bronchogenic carcinoma Cancer 1 634-56  LABORATORY DATA:  Lab Results  Component Value Date   WBC 9.5 04/21/2022   HGB 13.6 04/21/2022   HCT 41.2 04/21/2022   MCV 98.6 04/21/2022   PLT 164 04/21/2022   Lab Results  Component Value Date   NA  141 05/13/2022   K 4.3 05/13/2022   CL 103 05/13/2022   CO2 31 05/13/2022   Lab Results  Component Value Date   ALT 33 04/21/2022   AST 33 04/21/2022   ALKPHOS 50 04/21/2022   BILITOT 0.6 04/21/2022     RADIOGRAPHY: No results found.    IMPRESSION/PLAN: 1. 72 y.o. gentleman with Stage T2 adenocarcinoma of the prostate with Gleason Score of 3+4, and PSA of 7.3. We discussed the patient's workup and outlined the nature of prostate cancer in this setting. The patient's T stage, Gleason's score, and PSA put him into the favorable intermediate risk group. Accordingly, he is eligible for a variety of potential treatment options including brachytherapy, 5.5 weeks of external radiation, or prostatectomy. We discussed the available radiation techniques, and focused on the details and logistics of delivery. We discussed and outlined the risks, benefits, short and long-term effects associated with radiotherapy and compared and contrasted these with prostatectomy. We discussed the role of SpaceOAR gel in reducing the rectal toxicity associated with radiotherapy. He appears to have a good understanding of his disease and our treatment recommendations which are of curative intent.  He was encouraged to ask questions that were answered to his stated satisfaction.  At the conclusion of our conversation, the patient is interested in moving forward with brachytherapy. He is meeting with our schedulers today to arrange an appointment for seed placement. He knows to call back with any questions or concerns prior to his pre-seed appointment. We look forward to participating in this patient's care.   We personally spent 60 minutes in this encounter including chart review, reviewing radiological studies, meeting face-to-face with the patient, entering orders and completing documentation.     Leona Singleton, PA-C    Tyler Pita, MD  [facility] Direct Dial: [phone]  Fax:  [phone] [email or web address]  Skype  [email or web address]   This document serves as a record of services personally performed by Tyler Pita, MD and Leona Singleton, PA-C. It was created on their behalf by Wilburn Mylar, a trained medical scribe. The creation of this record is based on the scribe's personal observations and the provider's statements to them. This document has been checked and approved by the attending provider.

## 2022-05-27 ENCOUNTER — Ambulatory Visit
Admission: RE | Admit: 2022-05-27 | Discharge: 2022-05-27 | Disposition: A | Discharge status: Home / Self Care | Payer: Medicare Other | Source: Ambulatory Visit | Attending: Radiation Oncology | Admitting: Radiation Oncology

## 2022-05-27 ENCOUNTER — Telehealth: Payer: Self-pay | Admitting: Family Medicine

## 2022-05-27 ENCOUNTER — Encounter: Payer: Self-pay | Admitting: Radiation Oncology

## 2022-05-27 VITALS — BP 154/89 | HR 77 | Temp 97.7°F | Resp 18 | Ht 73.0 in | Wt 183.4 lb

## 2022-05-27 DIAGNOSIS — Z79899 Other long term (current) drug therapy: Secondary | ICD-10-CM | POA: Diagnosis not present

## 2022-05-27 DIAGNOSIS — K219 Gastro-esophageal reflux disease without esophagitis: Secondary | ICD-10-CM | POA: Insufficient documentation

## 2022-05-27 DIAGNOSIS — C61 Malignant neoplasm of prostate: Secondary | ICD-10-CM | POA: Insufficient documentation

## 2022-05-27 DIAGNOSIS — E785 Hyperlipidemia, unspecified: Secondary | ICD-10-CM | POA: Diagnosis not present

## 2022-05-27 DIAGNOSIS — Z191 Hormone sensitive malignancy status: Secondary | ICD-10-CM | POA: Diagnosis not present

## 2022-05-27 HISTORY — DX: Elevated prostate specific antigen (PSA): R97.20

## 2022-05-27 NOTE — Progress Notes (Signed)
Introduced myself to the patient, and his wife, as the prostate nurse navigator.  No barriers to care identified at this time.  He is here to discuss his radiation treatment options.  I gave him my business card and asked him to call me with questions or concerns.  Verbalized understanding.  

## 2022-05-27 NOTE — Telephone Encounter (Signed)
Pt would like to change pharmacies to:  [facility]  [place], [place]  No refills needed at this time.

## 2022-05-28 ENCOUNTER — Other Ambulatory Visit: Payer: Self-pay | Admitting: Urology

## 2022-05-28 ENCOUNTER — Telehealth: Payer: Self-pay | Admitting: *Deleted

## 2022-05-28 DIAGNOSIS — Z8546 Personal history of malignant neoplasm of prostate: Secondary | ICD-10-CM | POA: Insufficient documentation

## 2022-05-28 DIAGNOSIS — Z191 Hormone sensitive malignancy status: Secondary | ICD-10-CM | POA: Diagnosis not present

## 2022-05-28 DIAGNOSIS — C61 Malignant neoplasm of prostate: Secondary | ICD-10-CM | POA: Insufficient documentation

## 2022-05-28 HISTORY — DX: Malignant neoplasm of prostate: C61

## 2022-05-28 NOTE — Telephone Encounter (Signed)
Called patient to update and to inform of pre-seed appts. for 07-04-22 and his implant for 08-13-22, spoke with patient and he is aware of these appts.

## 2022-05-28 NOTE — Telephone Encounter (Signed)
Noted  

## 2022-07-02 ENCOUNTER — Telehealth: Payer: Self-pay | Admitting: *Deleted

## 2022-07-02 NOTE — Telephone Encounter (Signed)
CALLED  PATIENT TO REMIND OF PRE-SEED APPTS. FOR 07-04-22, SPOKE WITH PATIENT AND HE IS AWARE OF THESE APPTS.

## 2022-07-03 NOTE — Progress Notes (Signed)
  Radiation Oncology         [phone] ________________________________  Name: [name] MRN: [id number]  Date: 07/04/2022  DOB: 1950/09/04  SIMULATION AND TREATMENT PLANNING NOTE PUBIC ARCH STUDY  WU:[id number], Gwenlyn Found, MD  Belva Agee, MD  DIAGNOSIS:  72 y.o. 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   Oncology History   No history exists.      ICD-10-CM   1. Malignant neoplasm of prostate  C61       COMPLEX SIMULATION:  The patient presented today for evaluation for possible prostate seed implant. He was brought to the radiation planning suite and placed supine on the CT couch. A 3-dimensional image study set was obtained in upload to the planning computer. There, on each axial slice, I contoured the prostate gland. Then, using three-dimensional radiation planning tools I reconstructed the prostate in view of the structures from the transperineal needle pathway to assess for possible pubic arch interference. In doing so, I did not appreciate any pubic arch interference. Also, the patient's prostate volume was estimated based on the drawn structure. The volume was 36 cc.  Given the pubic arch appearance and prostate volume, patient remains a good candidate to proceed with prostate seed implant. Today, he freely provided informed written consent to proceed.    PLAN: The patient will undergo prostate seed implant.   ________________________________  Artist Pais. Kathrynn Running, M.D.

## 2022-07-03 NOTE — Progress Notes (Signed)
Radiation Oncology         [phone] ________________________________  Outpatient Follow up- Pre-seed visit  Name: Johnny Davenport MRN: [id number]  Date: 07/04/2022  DOB: [birth year]  [id number], Gwenlyn Found, MD  Belva Agee, MD   REFERRING PHYSICIAN: Belva Agee, MD  DIAGNOSIS: 72 y.o. 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     ICD-10-CM   1. Malignant neoplasm of prostate  C61       HISTORY OF PRESENT ILLNESS: Johnny Davenport is a 72 y.o. male with a diagnosis of prostate cancer. He was initially referred for evaluation in urology by Dr. Benancio Deeds in 2021 for a marginally elevated PSA of 4.25. They opted for PSA surveillance. His PSA fluctuated, reaching 5.62 in 03/2021, but dropped to 4.64 in 09/2021. More recently, his PSA rose significantly to 7.31 in 03/2022, digital rectal examination was performed at that time revealing a 10 mm left mid gland prostate nodule.  The patient proceeded to transrectal ultrasound with 12 biopsies of the prostate on 05/06/22.  The prostate volume measured 36.15 cc.  Out of 12 core biopsies, 4 were positive.  The maximum Gleason score was 3+4, and this was seen in left mid lateral, left apex lateral, and left mid (small focus). Additionally, Gleason 3+3 was seen in left apex.   The patient reviewed the biopsy results with his urologist and was kindly referred to [place] for discussion of potential radiation treatment options. We initially met the patient on 05/27/22 and he was most interested in proceeding with brachytherapy and SpaceOAR gel placement for treatment of his disease. He is here today for his pre-procedure imaging for planning and to answer any additional questions he may have about this treatment.   PREVIOUS RADIATION THERAPY: No  PAST MEDICAL HISTORY:  Past Medical History:  Diagnosis Date   Allergy    seasonal allergies   Arthritis    bilateral thumbs   BPH (benign prostatic  hyperplasia)    on meds   Dysuria 12/28/2019   Elevated PSA    GERD (gastroesophageal reflux disease)    with certain foods/OTC meds for tx   Hyperlipidemia    on meds   Incomplete bladder emptying 12/28/2019   11/22/2019      PAST SURGICAL HISTORY: Past Surgical History:  Procedure Laterality Date   COLONOSCOPY  2016   TA/hems-5 yr recall   Hand/Finger surgery  1997   KNEE ARTHROSCOPY W/ MENISCAL REPAIR Right    x 2   POLYPECTOMY  2016   TA   PROSTATE BIOPSY     ROTATOR CUFF REPAIR Left    TONSILLECTOMY     WISDOM TOOTH EXTRACTION      FAMILY HISTORY:  Family History  Problem Relation Age of Onset   Stroke Mother 67   Healthy Sister    Healthy Sister    Colon cancer Neg Hx    Colon polyps Neg Hx    Esophageal cancer Neg Hx    Stomach cancer Neg Hx    Rectal cancer Neg Hx     SOCIAL HISTORY:  Social History   Socioeconomic History   Marital status: Married    Spouse name: Not on file   Number of children: Not on file   Years of education: Not on file   Highest education level: Not on file  Occupational History   Not on file  Tobacco Use   Smoking status: Never   Smokeless  tobacco: Never  Vaping Use   Vaping Use: Never used  Substance and Sexual Activity   Alcohol use: Yes    Alcohol/week: 1.0 standard drink of alcohol    Types: 1 Standard drinks or equivalent per week   Drug use: No   Sexual activity: Not on file  Other Topics Concern   Not on file  Social History Narrative   Not on file   Social Determinants of Health   Financial Resource Strain: Low Risk  (09/10/2021)   Overall Financial Resource Strain (CARDIA)    Difficulty of Paying Living Expenses: Not hard at all  Food Insecurity: No Food Insecurity (05/27/2022)   Hunger Vital Sign    Worried About Running Out of Food in the Last Year: Never true    Ran Out of Food in the Last Year: Never true  Transportation Needs: No Transportation Needs (05/27/2022)   PRAPARE - Doctor, general practice (Medical): No    Lack of Transportation (Non-Medical): No  Physical Activity: Sufficiently Active (09/10/2021)   Exercise Vital Sign    Days of Exercise per Week: 7 days    Minutes of Exercise per Session: 30 min  Stress: No Stress Concern Present (09/10/2021)   [employer] of Occupational Health - Occupational Stress Questionnaire    Feeling of Stress : Not at all  Social Connections: Moderately Integrated (09/04/2020)   Social Connection and Isolation Panel [NHANES]    Frequency of Communication with Friends and Family: More than three times a week    Frequency of Social Gatherings with Friends and Family: More than three times a week    Attends Religious Services: Never    Database administrator or Organizations: Yes    Attends Engineer, structural: More than 4 times per year    Marital Status: Married  Catering manager Violence: Not At Risk (05/27/2022)   Humiliation, Afraid, Rape, and Kick questionnaire    Fear of Current or Ex-Partner: No    Emotionally Abused: No    Physically Abused: No    Sexually Abused: No    ALLERGIES: Other  MEDICATIONS:  Current Outpatient Medications  Medication Sig Dispense Refill   omeprazole (PRILOSEC) 20 MG capsule Take 1 capsule (20 mg total) by mouth daily as needed. 90 capsule 3   rosuvastatin (CRESTOR) 20 MG tablet Take 1 tablet (20 mg total) by mouth daily. 90 tablet 3   silodosin (RAPAFLO) 8 MG CAPS capsule Take 8 mg by mouth daily.     SUMAtriptan (IMITREX) 50 MG tablet Take 1 tablet (50 mg total) by mouth every 2 (two) hours as needed for migraine. Max 100 mg in 24 hours 10 tablet 6   No current facility-administered medications for this visit.    REVIEW OF SYSTEMS:  On review of systems, the patient reports that he is doing well overall. He denies any chest pain, shortness of breath, cough, fevers, chills, night sweats, unintended weight changes. He denies any bowel disturbances, and denies abdominal pain,  nausea or vomiting. He denies any new musculoskeletal or joint aches or pains. His IPSS was 13, indicating moderate urinary symptoms. His SHIM was 22, indicating he does not have erectile dysfunction. A complete review of systems is obtained and is otherwise negative.     PHYSICAL EXAM:  Wt Readings from Last 3 Encounters:  05/27/22 183 lb 6.4 oz (83.2 kg)  05/13/22 185 lb (83.9 kg)  04/23/22 190 lb 12.8 oz (86.5 kg)   Temp  Readings from Last 3 Encounters:  05/27/22 97.7 F (36.5 C)  05/13/22 97.8 F (36.6 C) (Temporal)  04/22/22 99.5 F (37.5 C) (Temporal)   BP Readings from Last 3 Encounters:  05/27/22 (!) 154/89  05/13/22 124/72  04/23/22 122/74   Pulse Readings from Last 3 Encounters:  05/27/22 77  05/13/22 84  04/23/22 79    /10  In general this is a well appearing Caucasian male in no acute distress. He's alert and oriented x4 and appropriate throughout the examination. Cardiopulmonary assessment is negative for acute distress, and he exhibits normal effort.     KPS = 100  100 - Normal; no complaints; no evidence of disease. 90   - Able to carry on normal activity; minor signs or symptoms of disease. 80   - Normal activity with effort; some signs or symptoms of disease. 37   - Cares for self; unable to carry on normal activity or to do active work. 60   - Requires occasional assistance, but is able to care for most of his personal needs. 50   - Requires considerable assistance and frequent medical care. 40   - Disabled; requires special care and assistance. 30   - Severely disabled; hospital admission is indicated although death not imminent. 20   - Very sick; hospital admission necessary; active supportive treatment necessary. 10   - Moribund; fatal processes progressing rapidly. 0     - Dead  Karnofsky DA, Abelmann WH, Craver LS and Burchenal [facility] [phone]) The use of the nitrogen mustards in the palliative treatment of carcinoma: with particular reference to  bronchogenic carcinoma Cancer 1 634-56  LABORATORY DATA:  Lab Results  Component Value Date   WBC 9.5 04/21/2022   HGB 13.6 04/21/2022   HCT 41.2 04/21/2022   MCV 98.6 04/21/2022   PLT 164 04/21/2022   Lab Results  Component Value Date   NA 141 05/13/2022   K 4.3 05/13/2022   CL 103 05/13/2022   CO2 31 05/13/2022   Lab Results  Component Value Date   ALT 33 04/21/2022   AST 33 04/21/2022   ALKPHOS 50 04/21/2022   BILITOT 0.6 04/21/2022     RADIOGRAPHY: No results found.    IMPRESSION/PLAN: 1. 72 y.o. 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  The patient has elected to proceed with seed implant for treatment of his disease. We reviewed the risks, benefits, short and long-term effects associated with brachytherapy and discussed the role of SpaceOAR in reducing the rectal toxicity associated with radiotherapy.  He appears to have a good understanding of his disease and our treatment recommendations which are of curative intent.  He was encouraged to ask questions that were answered to his stated satisfaction. He has freely signed written consent to proceed today in the office and a copy of this document will be placed in his medical record. His procedure is tentatively scheduled for 08/13/22 in collaboration with Dr. Benancio Deeds and we will see him back for his post-procedure visit approximately 3 weeks thereafter. We look forward to continuing to participate in his care. He knows that he is welcome to call with any questions or concerns at any time in the interim.  I personally spent 30 minutes in this encounter including chart review, reviewing radiological studies, meeting face-to-face with the patient, entering orders and completing documentation.    Marguarite Arbour, MMS, PA-C [facility] Radiation  Oncology Physician Assistant Direct Dial: [phone]  Fax: [phone]

## 2022-07-04 ENCOUNTER — Encounter: Payer: Self-pay | Admitting: Urology

## 2022-07-04 ENCOUNTER — Ambulatory Visit
Admission: RE | Admit: 2022-07-04 | Discharge: 2022-07-04 | Disposition: A | Discharge status: Home / Self Care | Payer: Medicare Other | Source: Ambulatory Visit | Attending: Radiation Oncology | Admitting: Radiation Oncology

## 2022-07-04 ENCOUNTER — Ambulatory Visit
Admission: RE | Admit: 2022-07-04 | Discharge: 2022-07-04 | Disposition: A | Discharge status: Home / Self Care | Payer: Medicare Other | Source: Ambulatory Visit | Attending: Urology | Admitting: Urology

## 2022-07-04 VITALS — Resp 18 | Ht 73.0 in | Wt 182.0 lb

## 2022-07-04 DIAGNOSIS — C61 Malignant neoplasm of prostate: Secondary | ICD-10-CM | POA: Diagnosis present

## 2022-07-04 DIAGNOSIS — Z191 Hormone sensitive malignancy status: Secondary | ICD-10-CM | POA: Diagnosis not present

## 2022-07-04 NOTE — Progress Notes (Signed)
Pre-seed nursing interview for a diagnosis of  72 y.o. 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  Patient identity verified. Patient reports doing well. No issues conveyed at this time.  Meaningful use complete. Urinary Management medication(s)- None Urology appointment date- 07/31/2022, with Dr. Benancio Deeds at [facility] [place]  Resp 18   Ht  (1.854 m)   Wt 182 lb (82.6 kg)   BMI 24.01 kg/m   This concludes the interview.   Ruel Favors, LPN

## 2022-07-26 ENCOUNTER — Other Ambulatory Visit: Payer: Self-pay

## 2022-07-26 ENCOUNTER — Encounter (HOSPITAL_BASED_OUTPATIENT_CLINIC_OR_DEPARTMENT_OTHER): Payer: Self-pay | Admitting: Urology

## 2022-07-26 NOTE — Progress Notes (Addendum)
Spoke w/ via phone for pre-op interview---pt Lab needs dos---- none            Lab results------EKG 04-21-2022 epic COVID test -----patient states asymptomatic no test needed Arrive at -------530 am 08-13-2022 NPO after MN NO Solid Food.  Clear liquids from MN until---430 am Med rec completed Medications to take morning of surgery -----sildosin, prilosec, imitrex prn Diabetic medication -----n/a Patient instructed no nail polish to be worn day of surgery Patient instructed to bring photo id and insurance card day of surgery Patient aware to have Driver (ride ) / caregiver wife pamela   for 24 hours after surgery  Patient Special Instructions -----fleets enema am of surgery Pre-Op special Instructions -----n/a Patient verbalized understanding of instructions that were given at this phone interview. Patient denies shortness of breath, chest pain, fever, cough at this phone interview.

## 2022-08-05 ENCOUNTER — Other Ambulatory Visit: Payer: Self-pay | Admitting: Family Medicine

## 2022-08-05 DIAGNOSIS — E785 Hyperlipidemia, unspecified: Secondary | ICD-10-CM

## 2022-08-12 ENCOUNTER — Telehealth: Payer: Self-pay | Admitting: *Deleted

## 2022-08-12 NOTE — H&P (Signed)
72 year old white male seen today for evaluation of marginal PSA elevation. The patient at baseline PSA of about 1.4 back in 2011 and has had progressive slow increase in PSA last 1 being 4.25 in July of 2021. There is no family history of prostate cancer. Patient does have moderate bladder outlet obstructive symptoms with decreased force of stream nocturia and feelings of incomplete emptying at times.  Micro urinalysis today was clear on urine spun sediment. Postvoid residual equals 290 cc   -03/29/20-patient with history of marginal PSA elevation of 4.25 back in July 2021. Had repeat value on 03/13/2020 is now 4.07. He has had some mild to moderate bladder irritative symptoms with mainly urgency and remains on tamsulosin 0.4 mg daily. This really helped control those symptoms. Patient is having some nasal congestion issues taking the medication otherwise doing well with that.   -10/09/20-patient with history of BPH and marginal PSA elevation as above. No interim GU issues. Remains on tamsulosin 0.4 mg daily with minimal voiding symptoms. The patient continues to have some problems with nasal congestion utilizing the tamsulosin and was interested in perhaps trying a different medicine. Patient's most recent PSA through his PCP is 4.64 on 09/29/2020 slight increased from 4 point 0 7 in January of 2022  -03/19/21-patient with history of BPH and marginal PSA elevation as above. Patient was initiated on silodosin 8 mg daily for bladder outlet symptoms. In the interim the patient never tried the silodosin but remains on tamsulosin and is tolerating the nasal congestion side effects.  Most recent PSA 5.62 with a 12% free PSA component on 03/13/2021 this is increased from 4.54 in July 2022  -10/05/21-patient with history of BPH and marginal PSA elevation as above. BPH managed silodosin 8 mg daily. Minimal urinary symptomology. Most recent PSA 4.64 on 09/10/2021 this is down from 5.62 on 03/13/2021.  Micro urinalysis is clear  on urine spun sediment  -04/05/21-patient with history of BPH and marginal PSA elevation as above. Patient currently managed with silodosin 8 mg daily with minimal voiding symptoms. PSA's been running between 4 and 6 but most recent PSA is now 7.31 on 03/29/2022. Patient has had no prior biopsy.  Micro urinalysis is clear on urine spun sediment  Postvoid residual is 9 cc  -05/06/22-patient with history of elevated PSA to 7.31 here for transrectal ultrasound prostate biopsy  -05/15/22-patient with history of marginal PSA elevation up to 7.3 and palpable prostate nodule on exam. Underwent recent transrectal ultrasound prostate biopsy on 05/06/2022. No postprocedural issues. Here to discuss path results. Pathology shows 4 out of 12 cores positive for adenocarcinoma the prostate as below.  TRUS/BX 05/06/22 PSA 7.31  PATH:  1. Left mid lateral, prostate adenocarcinoma, Gleason score 3+4 equal 7, grade group 2, involves 60% of 1 core  2. Left apical lateral prostate adenocarcinoma, Gleason score 3+4 equal 7, grade group 2 involving 70% of 1 core  3. left mid, small focus prostate adenocarcinoma, Gleason score 3+4 equal 7 involving less than 5% of 1 core  4. left apex, prostate adenocarcinoma, Gleason score 3+3 equal 6 involving 30% of 1 core  NCCN: Favorable intermediate  -07/31/22-patient with recent diagnosis adenocarcinoma the prostate on 05/06/2022 showing favorable intermediate disease as above. Has met with radiation oncology is opted for interstitial brachytherapy and SpaceOAR gel placement. Here for preop evaluation for this.        ALLERGIES: No Allergies    MEDICATIONS: Omeprazole  Silodosin 8 mg capsule 1 capsule PO Daily  Multivitamin  Rosuvastatin Calcium     GU PSH: Prostate Needle Biopsy - 05/06/2022       PSH Notes: knee scope 2000,2001,2006   NON-GU PSH: Hand/finger Surgery - 1997 Surgical Pathology, Gross And Microscopic Examination For Prostate Needle - 05/06/2022     GU PMH:  Prostate Cancer - 05/15/2022 Elevated PSA - 05/06/2022, (Stable), - 04/05/2022, - 10/05/2021, - 03/19/2021, - 10/09/2020, - 2022, - 2021 Prostate nodule w/ LUTS - 04/05/2022 Urinary Frequency - 04/05/2022, - 10/05/2021, - 03/19/2021, - 10/09/2020, - 2021 BPH w/LUTS - 10/05/2021, - 03/19/2021, - 10/09/2020, - 2022, - 2021, - 2021 Urinary Urgency - 2022, - 2021 Dysuria - 2021 Incomplete bladder emptying (Stable) - 2021, - 2021    NON-GU PMH: Arthritis GERD Hypercholesterolemia    FAMILY HISTORY: 2 sons - Other father deceased - Other mother deceased - Other stroke - Mother   SOCIAL HISTORY: Marital Status: Married Preferred Language: English; Race: White Current Smoking Status: Patient has never smoked.   Tobacco Use Assessment Completed: Used Tobacco in last 30 days? Does not use smokeless tobacco. Drinks 1 drink per week.  Does not use drugs. Drinks 1 caffeinated drink per day. Has not had a blood transfusion. Patient's occupation is/was Retired.    REVIEW OF SYSTEMS:    GU Review Male:   Patient denies frequent urination, hard to postpone urination, burning/ pain with urination, get up at night to urinate, leakage of urine, stream starts and stops, trouble starting your stream, have to strain to urinate , erection problems, and penile pain.  Gastrointestinal (Upper):   Patient denies nausea, vomiting, and indigestion/ heartburn.  Gastrointestinal (Lower):   Patient denies diarrhea and constipation.  Constitutional:   Patient denies fever, night sweats, weight loss, and fatigue.  Skin:   Patient denies skin rash/ lesion and itching.  Eyes:   Patient denies blurred vision and double vision.  Ears/ Nose/ Throat:   Patient denies sore throat and sinus problems.  Hematologic/Lymphatic:   Patient denies swollen glands and easy bruising.  Cardiovascular:   Patient denies leg swelling and chest pains.  Respiratory:   Patient denies cough and shortness of breath.  Endocrine:   Patient denies excessive  thirst.  Musculoskeletal:   Patient denies back pain and joint pain.  Neurological:   Patient denies headaches and dizziness.  Psychologic:   Patient denies depression and anxiety.   VITAL SIGNS: None   MULTI-SYSTEM PHYSICAL EXAMINATION:    Constitutional: Well-nourished. No physical deformities. Normally developed. Good grooming.  Neck: Neck symmetrical, not swollen. Normal tracheal position.  Respiratory: No labored breathing, no use of accessory muscles.   Cardiovascular: Normal temperature, normal extremity pulses, no swelling, no varicosities.  Lymphatic: No enlargement of neck, axillae, groin.  Skin: No paleness, no jaundice, no cyanosis. No lesion, no ulcer, no rash.  Neurologic / Psychiatric: Oriented to time, oriented to place, oriented to person. No depression, no anxiety, no agitation.  Eyes: Normal conjunctivae. Normal eyelids.  Ears, Nose, Mouth, and Throat: Left ear no scars, no lesions, no masses. Right ear no scars, no lesions, no masses. Nose no scars, no lesions, no masses. Normal hearing. Normal lips.  Musculoskeletal: Normal gait and station of head and neck.     Complexity of Data:  Source Of History:  Patient  Records Review:   Previous Doctor Records, Previous Patient Records  Urine Test Review:   Urinalysis   03/29/22 09/10/21 03/13/21  PSA  Total PSA 7.31 ng/mL 4.64 ng/mL 5.62 ng/mL  Free PSA 1.27 ng/mL  0.93 ng/mL 0.70 ng/mL  % Free PSA 17 % PSA 20 % PSA 12 % PSA    PROCEDURES:          Urinalysis - 81003 Dipstick Dipstick Cont'd  Color: Yellow Bilirubin: Neg mg/dL  Appearance: Clear Ketones: Neg mg/dL  Specific Gravity: 1.610 Blood: Neg ery/uL  pH: <=5.0 Protein: Neg mg/dL  Glucose: Neg mg/dL Urobilinogen: 0.2 mg/dL    Nitrites: Neg    Leukocyte Esterase: Neg leu/uL    ASSESSMENT:      ICD-10 Details  1 GU:   Prostate Cancer - C61 Acute, Complicated Injury   PLAN:            Medications Stop Meds: Levofloxacin 750 mg tablet Take 1 tab po 1  hour prior to procedure  Start: 04/05/2022  Discontinue: 07/31/2022  - Reason: The medication cycle was completed.            Document Letter(s):  Created for Patient: Clinical Summary         Notes:   Patient scheduled for interstitial brachytherapy and SpaceOAR gel placement as definitive management for his prostate cancer. Risk and benefits of procedure were discussed in detail including risks of bleeding infection urinary symptomology possible urinary retention post procedurally. Patient understands risks benefits of procedure and desires to proceed. Scheduled for 08/13/2022.

## 2022-08-12 NOTE — Telephone Encounter (Signed)
CALLED PATIENT TO REMIND OF PROCEDURE FOR 08-13-22, SPOKE WITH PATIENT AND HE IS AWARE OF THIS PROCEDURE

## 2022-08-13 ENCOUNTER — Encounter (HOSPITAL_BASED_OUTPATIENT_CLINIC_OR_DEPARTMENT_OTHER): Payer: Self-pay | Admitting: Urology

## 2022-08-13 ENCOUNTER — Ambulatory Visit (HOSPITAL_COMMUNITY): Payer: Medicare Other

## 2022-08-13 ENCOUNTER — Encounter (HOSPITAL_BASED_OUTPATIENT_CLINIC_OR_DEPARTMENT_OTHER)
Admission: RE | Disposition: A | Discharge status: Home / Self Care | Payer: Self-pay | Source: Home / Self Care | Attending: Urology

## 2022-08-13 ENCOUNTER — Ambulatory Visit (HOSPITAL_BASED_OUTPATIENT_CLINIC_OR_DEPARTMENT_OTHER): Payer: Medicare Other | Admitting: Anesthesiology

## 2022-08-13 ENCOUNTER — Telehealth: Payer: Self-pay | Admitting: *Deleted

## 2022-08-13 ENCOUNTER — Other Ambulatory Visit: Payer: Self-pay

## 2022-08-13 ENCOUNTER — Ambulatory Visit (HOSPITAL_BASED_OUTPATIENT_CLINIC_OR_DEPARTMENT_OTHER)
Admission: RE | Admit: 2022-08-13 | Discharge: 2022-08-13 | Disposition: A | Discharge status: Home / Self Care | Payer: Medicare Other | Attending: Urology | Admitting: Urology

## 2022-08-13 DIAGNOSIS — C61 Malignant neoplasm of prostate: Secondary | ICD-10-CM | POA: Insufficient documentation

## 2022-08-13 DIAGNOSIS — K219 Gastro-esophageal reflux disease without esophagitis: Secondary | ICD-10-CM | POA: Insufficient documentation

## 2022-08-13 DIAGNOSIS — M1990 Unspecified osteoarthritis, unspecified site: Secondary | ICD-10-CM | POA: Diagnosis not present

## 2022-08-13 DIAGNOSIS — Z191 Hormone sensitive malignancy status: Secondary | ICD-10-CM | POA: Diagnosis not present

## 2022-08-13 DIAGNOSIS — Z01812 Encounter for preprocedural laboratory examination: Secondary | ICD-10-CM

## 2022-08-13 HISTORY — DX: Presence of spectacles and contact lenses: Z97.3

## 2022-08-13 HISTORY — DX: Headache, unspecified: R51.9

## 2022-08-13 HISTORY — DX: Malignant neoplasm of prostate: C61

## 2022-08-13 HISTORY — PX: SPACE OAR INSTILLATION: SHX6769

## 2022-08-13 HISTORY — PX: RADIOACTIVE SEED IMPLANT: SHX5150

## 2022-08-13 SURGERY — INSERTION, RADIATION SOURCE, PROSTATE
Anesthesia: General

## 2022-08-13 MED ORDER — FENTANYL CITRATE (PF) 100 MCG/2ML IJ SOLN: 25.0000 ug | INTRAMUSCULAR | Status: DC | PRN

## 2022-08-13 MED ORDER — PHENYLEPHRINE HCL (PRESSORS) 10 MG/ML IV SOLN
INTRAVENOUS | Status: DC | PRN
  Administered 2022-08-13 (×3): 80 ug via INTRAVENOUS
  Administered 2022-08-13: 40 ug via INTRAVENOUS
  Administered 2022-08-13 (×2): 80 ug via INTRAVENOUS

## 2022-08-13 MED ORDER — LACTATED RINGERS IV SOLN: INTRAVENOUS | Status: DC

## 2022-08-13 MED ORDER — SODIUM CHLORIDE FLUSH 0.9 % IV SOLN
INTRAVENOUS | Status: DC | PRN
  Administered 2022-08-13: 10 mL via INTRAVENOUS

## 2022-08-13 MED ORDER — CIPROFLOXACIN IN D5W 400 MG/200ML IV SOLN
INTRAVENOUS | Status: AC
  Filled 2022-08-13: qty 200

## 2022-08-13 MED ORDER — FLEET ENEMA 7-19 GM/118ML RE ENEM: 1.0000 | ENEMA | Freq: Once | RECTAL | Status: DC

## 2022-08-13 MED ORDER — STERILE WATER FOR IRRIGATION IR SOLN
Status: DC | PRN
  Administered 2022-08-13: 500 mL

## 2022-08-13 MED ORDER — IOHEXOL 300 MG/ML  SOLN
INTRAMUSCULAR | Status: DC | PRN
  Administered 2022-08-13: 7 mL

## 2022-08-13 MED ORDER — ONDANSETRON HCL 4 MG/2ML IJ SOLN: 4.0000 mg | Freq: Four times a day (QID) | INTRAMUSCULAR | Status: DC | PRN

## 2022-08-13 MED ORDER — PROPOFOL 10 MG/ML IV BOLUS
INTRAVENOUS | Status: DC | PRN
  Administered 2022-08-13: 120 mg via INTRAVENOUS

## 2022-08-13 MED ORDER — ONDANSETRON HCL 4 MG/2ML IJ SOLN
INTRAMUSCULAR | Status: DC | PRN
  Administered 2022-08-13: 4 mg via INTRAVENOUS

## 2022-08-13 MED ORDER — LIDOCAINE HCL (CARDIAC) PF 100 MG/5ML IV SOSY
PREFILLED_SYRINGE | INTRAVENOUS | Status: DC | PRN
  Administered 2022-08-13: 60 mg via INTRAVENOUS

## 2022-08-13 MED ORDER — EPHEDRINE SULFATE (PRESSORS) 50 MG/ML IJ SOLN
INTRAMUSCULAR | Status: DC | PRN
  Administered 2022-08-13 (×8): 5 mg via INTRAVENOUS

## 2022-08-13 MED ORDER — DEXAMETHASONE SODIUM PHOSPHATE 4 MG/ML IJ SOLN
INTRAMUSCULAR | Status: DC | PRN
  Administered 2022-08-13: 4 mg via INTRAVENOUS

## 2022-08-13 MED ORDER — SODIUM CHLORIDE 0.9 % IR SOLN
Status: DC | PRN
  Administered 2022-08-13: 1000 mL via INTRAVESICAL

## 2022-08-13 MED ORDER — CIPROFLOXACIN IN D5W 400 MG/200ML IV SOLN
400.0000 mg | INTRAVENOUS | Status: AC
  Administered 2022-08-13: 400 mg via INTRAVENOUS

## 2022-08-13 MED ORDER — OXYCODONE HCL 5 MG/5ML PO SOLN: 5.0000 mg | Freq: Once | ORAL | Status: DC | PRN

## 2022-08-13 MED ORDER — FENTANYL CITRATE (PF) 100 MCG/2ML IJ SOLN
INTRAMUSCULAR | Status: AC
  Filled 2022-08-13: qty 2

## 2022-08-13 MED ORDER — MIDAZOLAM HCL 2 MG/2ML IJ SOLN
INTRAMUSCULAR | Status: DC | PRN
  Administered 2022-08-13: 1 mg via INTRAVENOUS

## 2022-08-13 MED ORDER — TRAMADOL HCL 50 MG PO TABS: 50.0000 mg | ORAL_TABLET | Freq: Four times a day (QID) | ORAL | 0 refills | Status: DC | PRN

## 2022-08-13 MED ORDER — OXYCODONE HCL 5 MG PO TABS: 5.0000 mg | ORAL_TABLET | Freq: Once | ORAL | Status: DC | PRN

## 2022-08-13 MED ORDER — SUGAMMADEX SODIUM 200 MG/2ML IV SOLN
INTRAVENOUS | Status: DC | PRN
  Administered 2022-08-13: 200 mg via INTRAVENOUS

## 2022-08-13 MED ORDER — MIDAZOLAM HCL 2 MG/2ML IJ SOLN
INTRAMUSCULAR | Status: AC
  Filled 2022-08-13: qty 2

## 2022-08-13 MED ORDER — ROCURONIUM BROMIDE 100 MG/10ML IV SOLN
INTRAVENOUS | Status: DC | PRN
  Administered 2022-08-13: 50 mg via INTRAVENOUS
  Administered 2022-08-13: 10 mg via INTRAVENOUS

## 2022-08-13 MED ORDER — FENTANYL CITRATE (PF) 100 MCG/2ML IJ SOLN
INTRAMUSCULAR | Status: DC | PRN
  Administered 2022-08-13: 50 ug via INTRAVENOUS

## 2022-08-13 SURGICAL SUPPLY — 43 items
BAG DRN RND TRDRP ANRFLXCHMBR (UROLOGICAL SUPPLIES) ×1
BAG URINE DRAIN 2000ML AR STRL (UROLOGICAL SUPPLIES) ×1 IMPLANT
BLADE CLIPPER SENSICLIP SURGIC (BLADE) ×1 IMPLANT
Bard Quicklink seede IMPLANT
CATH FOLEY 2WAY SLVR 5CC 16FR (CATHETERS) ×2 IMPLANT
CATH ROBINSON RED A/P 16FR (CATHETERS) IMPLANT
CATH ROBINSON RED A/P 20FR (CATHETERS) ×1 IMPLANT
CLOTH BEACON ORANGE TIMEOUT ST (SAFETY) ×1 IMPLANT
CNTNR URN SCR LID CUP LEK RST (MISCELLANEOUS) ×1 IMPLANT
CONT SPEC 4OZ STRL OR WHT (MISCELLANEOUS) ×1
COVER BACK TABLE 60X90IN (DRAPES) ×1 IMPLANT
COVER MAYO STAND STRL (DRAPES) ×1 IMPLANT
DRSG TEGADERM 4X4.75 (GAUZE/BANDAGES/DRESSINGS) ×1 IMPLANT
DRSG TEGADERM 8X12 (GAUZE/BANDAGES/DRESSINGS) ×1 IMPLANT
GAUZE SPONGE 4X4 12PLY STRL (GAUZE/BANDAGES/DRESSINGS) IMPLANT
GLOVE BIO SURGEON STRL SZ 6.5 (GLOVE) IMPLANT
GLOVE BIO SURGEON STRL SZ7.5 (GLOVE) ×1 IMPLANT
GLOVE BIOGEL PI IND STRL 6.5 (GLOVE) IMPLANT
GLOVE SURG ORTHO 8.5 STRL (GLOVE) ×1 IMPLANT
GOWN STRL REUS W/TWL XL LVL3 (GOWN DISPOSABLE) ×1 IMPLANT
GRID BRACH TEMP 18GA 2.8X3X.75 (MISCELLANEOUS) ×1 IMPLANT
HOLDER FOLEY CATH W/STRAP (MISCELLANEOUS) ×1 IMPLANT
IMPL SPACEOAR VUE SYSTEM (Spacer) ×1 IMPLANT
IMPLANT SPACEOAR VUE SYSTEM (Spacer) ×1 IMPLANT
IV NS 1000ML (IV SOLUTION) ×1
IV NS 1000ML BAXH (IV SOLUTION) ×1 IMPLANT
KIT TURNOVER CYSTO (KITS) ×1 IMPLANT
NDL BRACHY 18G 5PK (NEEDLE) ×4 IMPLANT
NDL BRACHY 18G SINGLE (NEEDLE) IMPLANT
NDL PK MORGANSTERN STABILIZ (NEEDLE) ×1 IMPLANT
NEEDLE BRACHY 18G 5PK (NEEDLE) ×4 IMPLANT
NEEDLE BRACHY 18G SINGLE (NEEDLE) IMPLANT
NEEDLE PK MORGANSTERN STABILIZ (NEEDLE) ×1 IMPLANT
PACK CYSTO (CUSTOM PROCEDURE TRAY) ×1 IMPLANT
SHEATH ULTRASOUND LF (SHEATH) IMPLANT
SHEATH ULTRASOUND LTX NONSTRL (SHEATH) IMPLANT
SLEEVE SCD COMPRESS KNEE MED (STOCKING) ×1 IMPLANT
SUT BONE WAX W31G (SUTURE) IMPLANT
SYR 10ML LL (SYRINGE) ×2 IMPLANT
SYR CONTROL 10ML LL (SYRINGE) ×1 IMPLANT
TOWEL OR 17X24 6PK STRL BLUE (TOWEL DISPOSABLE) ×1 IMPLANT
UNDERPAD 30X36 HEAVY ABSORB (UNDERPADS AND DIAPERS) ×2 IMPLANT
WATER STERILE IRR 500ML POUR (IV SOLUTION) ×1 IMPLANT

## 2022-08-13 NOTE — Anesthesia Preprocedure Evaluation (Signed)
Anesthesia Evaluation  Patient identified by MRN, date of birth, ID band Patient awake    Reviewed: Allergy & Precautions, H&P , NPO status , Patient's Chart, lab work & pertinent test results  Airway Mallampati: II   Neck ROM: full    Dental   Pulmonary neg pulmonary ROS   breath sounds clear to auscultation       Cardiovascular negative cardio ROS  Rhythm:regular Rate:Normal     Neuro/Psych  Headaches    GI/Hepatic ,GERD  ,,  Endo/Other    Renal/GU    Prostate CA    Musculoskeletal  (+) Arthritis ,    Abdominal   Peds  Hematology   Anesthesia Other Findings   Reproductive/Obstetrics                             Anesthesia Physical Anesthesia Plan  ASA: 2  Anesthesia Plan: General   Post-op Pain Management:    Induction: Intravenous  PONV Risk Score and Plan: 2 and Ondansetron, Dexamethasone, Midazolam and Treatment may vary due to age or medical condition  Airway Management Planned: Oral ETT  Additional Equipment:   Intra-op Plan:   Post-operative Plan: Extubation in OR  Informed Consent: I have reviewed the patients History and Physical, chart, labs and discussed the procedure including the risks, benefits and alternatives for the proposed anesthesia with the patient or authorized representative who has indicated his/her understanding and acceptance.     Dental advisory given  Plan Discussed with: CRNA, Anesthesiologist and Surgeon  Anesthesia Plan Comments:        Anesthesia Quick Evaluation

## 2022-08-13 NOTE — Telephone Encounter (Signed)
RETURNED PATIENT'S WIFE'S PHONE CALL, LVM FOR A RETURN CALL 

## 2022-08-13 NOTE — Op Note (Signed)
Preoperative diagnosis: Clinically localized adenocarcinoma of the prostate (T1c)  Postoperative diagnosis: Clinically localized adenocarcinoma of the prostate  Procedure: 1) Transperineal placement of radioactive seeds into the prostate                    2) Cystoscopy                    3) Insertion of SpaceOAR hydrogel   Surgeon: Dr Karoline Caldwell  Radiation oncologist: Kathrynn Running  Anesthesia: General  EBL: Minimal  Complications: None  Indication: Johnny Davenport is a 72 y.o. gentleman with clinically localized prostate cancer. After discussing management options for treatment, he elected to proceed with radiotherapy. He presents today for the above procedures. The potential risks, complications, alternative options, and expected recovery course have been discussed in detail with the patient and he has provided informed consent to proceed.  Description of procedure: The patient was taken to the operating room and general anesthesia was induced. He was administered preoperative antibiotics, placed in the dorsal lithotomy position, and prepped and draped in the usual sterile fashion. Next, intraoperative transrectal ultrasonography was utilized for real-time intraoperative planning by the radiation oncology team. Once the treatment plan was completed and the seed strands created, stranded iodine 125 radiation seeds were placed utilizing a brachytherapy perineal template. 60 radioactive iodine 125 seeds into the prostate through 16 catheter needles.  The brachytherapy template was then removed.  A site in the midline was selected on the perineum for placement of an 18 g needle with saline.  The needle was advanced above the rectum and below Denonvillier's fascia to the mid gland and confirmed to be in the midline on transverse imaging.  One cc of saline was injected confirming appropriate expansion of this space.  A total of 5 cc of saline was then injected to open the space further bilaterally.  The  saline syringe was then removed and the SpaceOAR hydrogel was injected with good distribution bilaterally. Position of the radiation seeds was confirmed on fluoroscopic imaging.  Flexible cystoscopy was then performed and no seeds were identified within the bladder.  No bladder tumors, stones, or other mucosal pathology was identified within the bladder. He tolerated the procedure well and without complications. He was able to be transferred to the recovery unit in satisfactory condition.  He was given a voiding trial in the PACU.

## 2022-08-13 NOTE — Progress Notes (Signed)
Radiation Oncology         [phone] ________________________________  Name: Johnny Davenport MRN: [id number]  Date: 08/13/2022  DOB: 1950-08-20       Prostate Seed Implant  WU:[id number], Johnny Found, MD  No ref. provider Davenport  DIAGNOSIS:  72 y.o. 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   Oncology History  Malignant neoplasm of prostate (HCC)  05/06/2022 Cancer Staging   Staging form: Prostate, AJCC 8th Edition - Clinical stage from 05/06/2022: Stage IIB (cT2, cN0, cM0, PSA: 7.3, Grade Group: 2) - Signed by Marcello Fennel, PA-C on 07/04/2022 Histopathologic type: Adenocarcinoma, NOS Stage prefix: Initial diagnosis Prostate specific antigen (PSA) range: Less than 10 Gleason primary pattern: 3 Gleason secondary pattern: 4 Gleason score: 7 Histologic grading system: 5 grade system Number of biopsy cores examined: 12 Number of biopsy cores positive: 4 Location of positive needle core biopsies: One side   05/28/2022 Initial Diagnosis   Malignant neoplasm of prostate       ICD-10-CM   1. Encounter for pre-operative laboratory testing  Z01.812 CANCELED: CBG per Guidelines for Diabetes Management for Patients Undergoing Surgery ([facility], [facility], and [facility] only)    CANCELED: CBG per protocol    CANCELED: CBG per Guidelines for Diabetes Management for Patients Undergoing Surgery ([facility], [facility], and [facility] only)    CANCELED: CBG per protocol      PROCEDURE: Insertion of radioactive I-125 seeds into the prostate gland.  RADIATION DOSE: 145 Gy, definitive therapy.  TECHNIQUE: Johnny Davenport was brought to the operating room with the urologist. He was placed in the dorsolithotomy position. He was catheterized and a rectal tube was inserted. The perineum was shaved, prepped and draped. The ultrasound probe was then introduced by me into the rectum to see the prostate gland.  TREATMENT DEVICE: I attached the needle grid to the ultrasound probe stand and anchor  needles were placed.  3D PLANNING: The prostate was imaged in 3D using a sagittal sweep of the prostate probe. These images were transferred to the planning computer. There, the prostate, urethra and rectum were defined on each axial reconstructed image. Then, the software created an optimized 3D plan and a few seed positions were adjusted. The quality of the plan was reviewed using [facility] information for the target and the following two organs at risk:  Urethra and Rectum.  Then the accepted plan was printed and handed off to the radiation therapist.  Under my supervision, the custom loading of the seeds and spacers was carried out using the quick loader.  These pre-loaded needles were then placed into the needle holder.Marland Kitchen  PROSTATE VOLUME STUDY:  Using transrectal ultrasound the volume of the prostate was verified to be 40.9 cc.  SPECIAL TREATMENT PROCEDURE/SUPERVISION AND HANDLING: The pre-loaded needles were then delivered by the urologist under sagittal guidance. A total of 16 needles were used to deposit 60 seeds in the prostate gland. The individual seed activity was 0.443 mCi.  SpaceOAR:  Yes  COMPLEX SIMULATION: At the end of the procedure, an anterior radiograph of the pelvis was obtained to document seed positioning and count. Cystoscopy was performed by the urologist to check the urethra and bladder.  MICRODOSIMETRY: At the end of the procedure, the patient was emitting 0.066 mR/hr at 1 meter. Accordingly, he was considered safe for hospital discharge.  PLAN: The patient will return to the radiation oncology clinic for post implant CT dosimetry in three weeks.   ________________________________  Johnny Davenport, M.D.

## 2022-08-13 NOTE — Transfer of Care (Signed)
Immediate Anesthesia Transfer of Care Note  Patient: Johnny Davenport  Procedure(s) Performed: RADIOACTIVE SEED IMPLANT/BRACHYTHERAPY IMPLANT SPACE OAR INSTILLATION  Patient Location: PACU  Anesthesia Type:General  Level of Consciousness: awake, alert , and patient cooperative  Airway & Oxygen Therapy: Patient Spontanous Breathing and Patient connected to nasal cannula oxygen  Post-op Assessment: Report given to RN and Post -op Vital signs reviewed and stable  Post vital signs: Reviewed and stable  Last Vitals:  Vitals Value Taken Time  BP 132/83 08/13/22 1000  Temp 36.3 C 08/13/22 0959  Pulse 78 08/13/22 1003  Resp 15 08/13/22 1003  SpO2 98 % 08/13/22 1003  Vitals shown include unvalidated device data.  Last Pain:  Vitals:   08/13/22 0545  TempSrc: Oral  PainSc: 0-No pain      Patients Stated Pain Goal: 3 (08/13/22 0545)  Complications: No notable events documented.

## 2022-08-13 NOTE — Discharge Instructions (Signed)
Post Anesthesia Home Care Instructions  Activity: Get plenty of rest for the remainder of the day. A responsible individual must stay with you for 24 hours following the procedure.  For the next 24 hours, DO NOT: -Drive a car -Operate machinery -Drink alcoholic beverages -Take any medication unless instructed by your physician -Make any legal decisions or sign important papers.  Meals: Start with liquid foods such as gelatin or soup. Progress to regular foods as tolerated. Avoid greasy, spicy, heavy foods. If nausea and/or vomiting occur, drink only clear liquids until the nausea and/or vomiting subsides. Call your physician if vomiting continues.  Special Instructions/Symptoms: Your throat may feel dry or sore from the anesthesia or the breathing tube placed in your throat during surgery. If this causes discomfort, gargle with warm salt water. The discomfort should disappear within 24 hours.  PROSTATE CANCER TREATMENT WITH RADIOACTIVE IODINE-125 SEED IMPLANT  This instruction sheet is intended to discuss implantation of Iodine-125 seeds as treatment for cancer of the prostate. It will explain in detail what you may expect from this treatment and what precautions are necessary as a result of the treatment. Iodine-125 emits a relatively low energy radiation. The radioactive seeds are surgically implanted directly into the prostate gland. Most of the radiation is contained within the prostate gland. A very small amount is present outside the body.The precautions that we ask you to take are to ensure that those around you are protected from unnecessary radiation. The principles of radiation safety that you need to understand are:  DISTANCE: The further a person is from the radioactive implant the less radiation they will be receiving. The amount of radiation received falls off quite rapidly with distance. More specific guidelines are given in the table on the last page.  TIME: The amount of  radiation a person is exposed to is directly proportional to the amount of time that is spent in close proximity to the radioactive implant. Very little radiation will be received during short periods. See the table on the last page for more specific guideline.  CHILDREN UNDER AGE 18 Children should not be allowed to sit on your lap or otherwise be in very close contact for more than a few minutes for the first 6-8 weeks following the implant. You may affectionately greet (hug/kiss) a child for a short period of time, but remember, the longer you are in close proximity with that child the more radiation they are being exposed to. At a distance of 6 feet there is no limit to the length of time you may spend together. See specific guidelines on the last page.  PREGNANT OR POSSIBLY PREGNANT WOMEN Pregnant women should avoid prolonged close physical contact with you for the first 6-8 weeks after implant. At a distance of 6 feet there is no limit to the length of time you may spend together. Pregnant women or possibly pregnant women can safely be in close contact with you for a limited period of time. See the last page for guidelines.  FAMILY RELATIONS You may sleep in the same bed as your partner (provided she is not pregnant or under the age of 45). Sexual intercourse, using a condom, may be resumed 2 weeks after the implant. Your semen may be discolored, dark brown or black. This is normal and is the result of bleeding that may have occurred during the implant. After 3-4 weeks it will not be necessary to use a condom.  DAILY ACTIVITIES You may resume normal activities in a   few days (example: work, shopping, church) without the risk of harmful radiation exposure to those around you provided you keep in mind the time and distance precautions. Objects that you touch or item that you use do not become radioactive. Linens, clothing, tableware, and dishes may be used by other persons without special  precautions. Your bodily wastes (urine and stool) are not radioactive.  SPECIAL PRECAUTIONS It is possible to lose implanted Iodine-125 seed(s) through urination. Although it is possible to pass seeds indefinitely, it is most likely to occur immediately after catheter removal. To prevent this from happening the catheter that was in place during the implant procedure is removed immediately after the implant and a cystoscopy procedure is performed. The process of removing the catheter and the cystoscopy procedure should dislodge and remove any seeds that are not firmly imbedded in the prostate tissue. However, you should watch for seeds if/when you remove your catheter at home. The seeds are silver colored and the size of a grain of rice. In the unlikely event that a seed is seen after urination, simply flush the seed down the toilet. The seed should not be handled with your fingers, not even with a glove or napkin. A spoon or tweezers can be used to pick up a seed. The Radiation Oncology department is open Monday - Friday from 8:00 am to 5:30 pm with a Radiation Oncologist on call at all times. He or she may be reached by calling [phone]. If you are to be hospitalized or if death should occur, your family should notify the Radiation Safety officer.  SIDE EFFECTS There are very few side effects associate with the implant procedure. Minor burning with urination, weak stream, hesitancy, intermittency, frequency, mild pain or feeling unable to pass your urine freely are common and usually stop in one to four months. If these symptoms are extremely uncomfortable, contact your physician.  RADIATION SAFETY GUIDELINES PROSTATE CANCER TREATMENT WITH RADIOACTIVE IODINE-125 SEED IMPLANT  The following guidelines will limit exposure to less than naturally occurring background radiation.  PERSONS AGE 18-45 (if able to become pregnant)  FOR 8 WEEKS FOLLOWING IMPLANT  At a distance of 1 foot: limit time to  less than 2 hours/week At a distance of 3 feet: limit time to 20 hours/week At a distance of 6 feet: no restrictions  AFTER 8 WEEKS No restrictions  CHILDREN UNDER AGE 18, PREGNANT WOMEN OR POSSIBLY PREGNANT WOMEN  FOR 8 WEEKS FOLLOWING IMPLANT At a distance of 1 foot: limit time to 10 minutes/week At a distance of 3 feet: limit time to 2 hours/week At a distance of 6 feet: no restrictions  AFTER 8 WEEKS No restrictions  PERSONS OVER THE AGE OF 45 AND DO NOT EXPECT TO HAVE ANY MORE CHILDREN No restrictions  Updated by [facility] in January 2020  

## 2022-08-13 NOTE — Interval H&P Note (Signed)
History and Physical Interval Note:  08/13/2022 7:14 AM  Johnny Davenport  has presented today for surgery, with the diagnosis of PROSTATE CANCER.  The various methods of treatment have been discussed with the patient and family. After consideration of risks, benefits and other options for treatment, the patient has consented to  Procedure(s) with comments: RADIOACTIVE SEED IMPLANT/BRACHYTHERAPY IMPLANT (N/A) - 90 MINS SPACE OAR INSTILLATION (N/A) as a surgical intervention.  The patient's history has been reviewed, patient examined, no change in status, stable for surgery.  I have reviewed the patient's chart and labs.  Questions were answered to the patient's satisfaction.     Belva Agee

## 2022-08-13 NOTE — Anesthesia Procedure Notes (Signed)
Procedure Name: Intubation Date/Time: 08/13/2022 8:18 AM  Performed by: Earmon Phoenix, CRNAPre-anesthesia Checklist: Patient identified, Emergency Drugs available, Suction available, Patient being monitored and Timeout performed Patient Re-evaluated:Patient Re-evaluated prior to induction Oxygen Delivery Method: Circle system utilized Preoxygenation: Pre-oxygenation with 100% oxygen Induction Type: IV induction Ventilation: Mask ventilation without difficulty Laryngoscope Size: 4 and Mac Grade View: Grade I Tube type: Oral Tube size: 7.5 mm Number of attempts: 1 Airway Equipment and Method: Stylet Placement Confirmation: ETT inserted through vocal cords under direct vision, positive ETCO2 and breath sounds checked- equal and bilateral Secured at: 23 cm Tube secured with: Tape Dental Injury: Teeth and Oropharynx as per pre-operative assessment

## 2022-08-14 ENCOUNTER — Encounter (HOSPITAL_BASED_OUTPATIENT_CLINIC_OR_DEPARTMENT_OTHER): Payer: Self-pay | Admitting: Urology

## 2022-08-14 NOTE — Anesthesia Postprocedure Evaluation (Signed)
Anesthesia Post Note  Patient: Johnny Davenport  Procedure(s) Performed: RADIOACTIVE SEED IMPLANT/BRACHYTHERAPY IMPLANT SPACE OAR INSTILLATION     Patient location during evaluation: PACU Anesthesia Type: General Level of consciousness: awake and alert Pain management: pain level controlled Vital Signs Assessment: post-procedure vital signs reviewed and stable Respiratory status: spontaneous breathing, nonlabored ventilation, respiratory function stable and patient connected to nasal cannula oxygen Cardiovascular status: blood pressure returned to baseline and stable Postop Assessment: no apparent nausea or vomiting Anesthetic complications: no   No notable events documented.  Last Vitals:  Vitals:   08/13/22 1030 08/13/22 1055  BP: 126/74 134/77  Pulse: 64   Resp: 15 16  Temp: (!) 36.3 C   SpO2: 100% 98%    Last Pain:  Vitals:   08/14/22 1058  TempSrc:   PainSc: 3                  [name] S

## 2022-08-29 ENCOUNTER — Telehealth: Payer: Self-pay

## 2022-08-29 NOTE — Telephone Encounter (Signed)
Rn called back pt to inform him that his current concern of liquid coming from rectum when he urinates is normal. He was relieved and was about to board his plane for this trip. He was grateful for the phone call.

## 2022-08-29 NOTE — Telephone Encounter (Signed)
Pt called RN to report that when he urinates he also has some clear liquid from his rectum as well. He states he is having no pain or bleeding with these episodes. He also reported that is set to travel on an airplane in an hour and will not be back in town until late Sunday. He would like a call back as soon as possible at [phone]. He would like to know if is safe to go out of town.

## 2022-09-03 ENCOUNTER — Telehealth: Payer: Self-pay | Admitting: *Deleted

## 2022-09-03 NOTE — Telephone Encounter (Signed)
CALLED PATIENT TO REMIND OF POST SEED APPTS. FOR 09-04-22, SPOKE WITH PATIENT AND HE IS AWARE OF THESE APPTS.

## 2022-09-04 ENCOUNTER — Encounter: Payer: Self-pay | Admitting: Urology

## 2022-09-04 ENCOUNTER — Ambulatory Visit
Admission: RE | Admit: 2022-09-04 | Discharge: 2022-09-04 | Disposition: A | Discharge status: Home / Self Care | Payer: Medicare Other | Source: Ambulatory Visit | Attending: Urology | Admitting: Urology

## 2022-09-04 ENCOUNTER — Other Ambulatory Visit: Payer: Self-pay

## 2022-09-04 ENCOUNTER — Ambulatory Visit
Admission: RE | Admit: 2022-09-04 | Discharge: 2022-09-04 | Disposition: A | Discharge status: Home / Self Care | Payer: Medicare Other | Source: Ambulatory Visit | Attending: Radiation Oncology | Admitting: Radiation Oncology

## 2022-09-04 VITALS — BP 134/85 | HR 77 | Temp 98.0°F | Resp 18 | Ht 72.0 in | Wt 181.0 lb

## 2022-09-04 DIAGNOSIS — C61 Malignant neoplasm of prostate: Secondary | ICD-10-CM | POA: Insufficient documentation

## 2022-09-04 NOTE — Progress Notes (Addendum)
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  Patient identity verified x2. Patient reports doing well and denies any discomfort. No issues conveyed at this time.  Meaningful use complete.  I-PSS score- 12 - Moderate SHIM score- 19 Urinary Management medication(s) Silodosin Urology appointment date- None with "Pending Dr." at [facility] [place]  Vitals- BP 134/85 (BP Location: Left Arm, Patient Position: Sitting, Cuff Size: Normal)   Pulse 77   Temp 98 F (36.7 C) (Oral)   Resp 18   Ht 6' (1.829 m)   Wt 181 lb (82.1 kg)   SpO2 100%   BMI 24.55 kg/m   This concludes the interaction.  Ruel Favors, LPN

## 2022-09-04 NOTE — Progress Notes (Signed)
  Radiation Oncology         [phone] ________________________________  Name: Johnny Davenport MRN: [id number]  Date: 09/04/2022  DOB: 1950-05-07  COMPLEX SIMULATION NOTE  NARRATIVE:  The patient was brought to the CT Simulation planning suite today following prostate seed implantation approximately one month ago.  Identity was confirmed.  All relevant records and images related to the planned course of therapy were reviewed.  Then, the patient was set-up supine.  CT images were obtained.  The CT images were loaded into the planning software.  Then the prostate and rectum were contoured.  Treatment planning then occurred.  The implanted iodine 125 seeds were identified by the physics staff for projection of radiation distribution  I have requested : 3D Simulation  I have requested a DVH of the following structures: Prostate and rectum.    ________________________________  [name], M.D.

## 2022-09-04 NOTE — Progress Notes (Signed)
Radiation Oncology         [phone] ________________________________  Name: Johnny Davenport MRN: [id number]  Date: 09/04/2022  DOB: 09/15/1950  Post-Seed Follow-Up Visit Note  CC: Copland, Gwenlyn Found, MD  Belva Agee, MD  Diagnosis:   72 y.o. 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     ICD-10-CM   1. Malignant neoplasm of prostate (HCC)  C61       Interval Since Last Radiation:  3 weeks 08/13/22:  Insertion of radioactive I-125 seeds into the prostate gland; 145 Gy, definitive therapy with placement of SpaceOAR gel.  Narrative:  The patient returns today for routine follow-up.  He is complaining of increased urinary frequency and urinary hesitation symptoms. He filled out a questionnaire regarding urinary function today providing and overall IPSS score of 12 characterizing his symptoms as mild-moderate. He has continued taking the Rapaflo daily and is pleased with his progress to date. He still has some mild increased urgency and frequency but denies dysuria, gross hematuria, pelvic pain, fever, chills or incontinence.  His pre-implant score was 13, on Rapaflo. He denies any abdominal pain or bowel symptoms. He has noted some mild decreased stamina and occasionally needs to take a power-nap during the day but nothing that has interfered with his ability to remain active and do the things he enjoys doing.  ALLERGIES:  is allergic to other.  Meds: Current Outpatient Medications  Medication Sig Dispense Refill   omeprazole (PRILOSEC) 20 MG capsule Take 1 capsule (20 mg total) by mouth daily as needed. (Patient taking differently: Take 20 mg by mouth daily.) 90 capsule 3   rosuvastatin (CRESTOR) 20 MG tablet Take 1 tablet (20 mg total) by mouth daily. (Patient taking differently: Take 20 mg by mouth at bedtime.) 90 tablet 3   silodosin (RAPAFLO) 8 MG CAPS capsule Take 8 mg by mouth daily.     SUMAtriptan (IMITREX) 50 MG tablet  Take 1 tablet (50 mg total) by mouth every 2 (two) hours as needed for migraine. Max 100 mg in 24 hours 10 tablet 6   traMADol (ULTRAM) 50 MG tablet Take 1 tablet (50 mg total) by mouth every 6 (six) hours as needed. 20 tablet 0   No current facility-administered medications for this encounter.    Physical Findings: In general this is a well appearing Caucasian male in no acute distress. He's alert and oriented x4 and appropriate throughout the examination. Cardiopulmonary assessment is negative for acute distress and he exhibits normal effort.   Lab Findings: Lab Results  Component Value Date   WBC 9.5 04/21/2022   HGB 13.6 04/21/2022   HCT 41.2 04/21/2022   MCV 98.6 04/21/2022   PLT 164 04/21/2022    Radiographic Findings:  Patient underwent CT imaging in our clinic for post implant dosimetry. The CT will be reviewed by Johnny Davenport to confirm there is an adequate distribution of radioactive seeds throughout the prostate gland and ensure that there are no seeds in or near the rectum.  We suspect the final radiation plan and dosimetry will show appropriate coverage of the prostate gland. He understands that we will call and inform him of any unexpected findings on further review of his imaging and dosimetry.  Impression/Plan: 72 y.o. 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  The patient is recovering from the effects of radiation. His urinary symptoms should gradually improve  over the next 4-6 months. We talked about this today. He is encouraged by his improvement already and is otherwise pleased with his outcome. We also talked about long-term follow-up for prostate cancer following seed implant. He understands that ongoing PSA determinations and digital rectal exams will help perform surveillance to rule out disease recurrence. He does not have any scheduled follow up with Dr. Benancio Deeds to his knowledge so we will help to coordinate for  a follow up visit in the urology office to check a post-treatment PSA in 11/2022. He understands what to expect with his PSA measures. Patient was also educated today about some of the long-term effects from radiation including a small risk for rectal bleeding and possibly erectile dysfunction. We talked about some of the general management approaches to these potential complications. However, I did encourage the patient to contact our office or return at any point if he has questions or concerns related to his previous radiation and prostate cancer.    Marguarite Arbour, PA-C

## 2022-09-06 ENCOUNTER — Telehealth: Payer: Self-pay | Admitting: *Deleted

## 2022-09-06 DIAGNOSIS — Z191 Hormone sensitive malignancy status: Secondary | ICD-10-CM | POA: Diagnosis not present

## 2022-09-06 DIAGNOSIS — C61 Malignant neoplasm of prostate: Secondary | ICD-10-CM | POA: Diagnosis not present

## 2022-09-06 NOTE — Telephone Encounter (Signed)
RETURNED PATIENT'S PHONE CALL, SPOKE WITH PATIENT. ?

## 2022-09-07 ENCOUNTER — Encounter: Payer: Self-pay | Admitting: Radiation Oncology

## 2022-09-07 NOTE — Progress Notes (Addendum)
  Radiation Oncology         [phone] ________________________________  Name: Johnny Davenport MRN: [id number]  Date: 09/06/2022  DOB: [birth year]  3D Planning Note   Prostate Brachytherapy Post-Implant Dosimetry  Diagnosis: 72 y.o. 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   Narrative: On a previous date, Johnny Davenport returned following prostate seed implantation for post implant planning. He underwent CT scan complex simulation to delineate the three-dimensional structures of the pelvis and demonstrate the radiation distribution.  Since that time, the seed localization, and complex isodose planning with dose volume histograms have now been completed.  Results:   Prostate Coverage - The dose of radiation delivered to the 90% or more of the prostate gland (D90) was 109.97% of the prescription dose. This exceeds our goal of greater than 90%. Rectal Sparing - The volume of rectal tissue receiving the prescription dose or higher was 0.0 cc. This falls under our thresholds tolerance of 1.0 cc.  Impression: The prostate seed implant appears to show adequate target coverage and appropriate rectal sparing.  Plan:  The patient will continue to follow with urology for ongoing PSA determinations. I would anticipate a high likelihood for local tumor control with minimal risk for rectal morbidity.  ________________________________  [name], M.D.

## 2022-09-16 NOTE — Progress Notes (Signed)
Patient was a RadOnc Consult on 05/27/22 for his stage T2 adenocarcinoma of the prostate with Gleason score of 3+4, and PSA of 7.3. Patient proceed with treatment recommendations of brachytherapy and had this on 08/13/22.   Patient had his post seed CT Sim on 6/26 and will have his first post treatment PSA on 11/13/22.

## 2022-09-23 ENCOUNTER — Ambulatory Visit (INDEPENDENT_AMBULATORY_CARE_PROVIDER_SITE_OTHER): Payer: Medicare Other | Admitting: *Deleted

## 2022-09-23 DIAGNOSIS — Z0000 Encounter for general adult medical examination without abnormal findings: Secondary | ICD-10-CM

## 2022-09-23 NOTE — Progress Notes (Signed)
Subjective:   Johnny Davenport is a 72 y.o. male who presents for Medicare Annual/Subsequent preventive examination.  Visit Complete: Virtual  I connected with  Johnny Davenport on 09/23/22 by a audio enabled telemedicine application and verified that I am speaking with the correct person using two identifiers.  Patient Location: Home  Provider Location: Office/Clinic  I discussed the limitations of evaluation and management by telemedicine. The patient expressed understanding and agreed to proceed.  Patient Medicare AWV questionnaire was completed by the patient on 09/22/22; I have confirmed that all information answered by patient is correct and no changes since this date.  Review of Systems     Cardiac Risk Factors include: advanced age (>105men, >59 women);male gender;dyslipidemia     Objective:    There were no vitals filed for this visit. There is no height or weight on file to calculate BMI. Per patient no change in vitals since last visit, unable to obtain new vitals due to telehealth visit      09/23/2022    8:22 AM 09/04/2022    9:54 AM 08/13/2022    6:00 AM 07/04/2022   10:08 AM 05/27/2022    9:43 AM 02/12/2022    7:44 AM 09/10/2021   12:32 PM  Advanced Directives  Does Patient Have a Medical Advance Directive? Yes Yes Yes Yes Yes Yes Yes  Type of Estate agent of [place];Living will Living will;Healthcare Power of Attorney  Living will;Healthcare Power of Attorney Living will;Healthcare Power of Attorney Living will;Healthcare Power of [employer] Power of [place];Living will  Does patient want to make changes to medical advance directive? No - Patient declined  No - Patient declined      Copy of Healthcare Power of Attorney in Chart? No - copy requested      No - copy requested    Current Medications (verified) Outpatient Encounter Medications as of 09/23/2022  Medication Sig   omeprazole (PRILOSEC) 20 MG capsule Take 1 capsule (20 mg total) by  mouth daily as needed. (Patient taking differently: Take 20 mg by mouth daily.)   rosuvastatin (CRESTOR) 20 MG tablet Take 1 tablet (20 mg total) by mouth daily. (Patient taking differently: Take 20 mg by mouth at bedtime.)   silodosin (RAPAFLO) 8 MG CAPS capsule Take 8 mg by mouth daily.   SUMAtriptan (IMITREX) 50 MG tablet Take 1 tablet (50 mg total) by mouth every 2 (two) hours as needed for migraine. Max 100 mg in 24 hours   [DISCONTINUED] traMADol (ULTRAM) 50 MG tablet Take 1 tablet (50 mg total) by mouth every 6 (six) hours as needed.   No facility-administered encounter medications on file as of 09/23/2022.    Allergies (verified) Other   History: Past Medical History:  Diagnosis Date   Allergy    seasonal allergies   Arthritis    bilateral thumbs   BPH (benign prostatic hyperplasia)    on meds   Elevated PSA    GERD (gastroesophageal reflux disease)    with certain foods/OTC meds for tx   Hyperlipidemia    on meds   Incomplete bladder emptying 12/28/2019   11/22/2019   migraine    Prostate cancer (HCC)    Wears glasses    Past Surgical History:  Procedure Laterality Date   COLONOSCOPY  2021   TA/hems-5 yr recall   Hand/Finger surgery  1997   KNEE ARTHROSCOPY W/ MENISCAL REPAIR Right    x 2   POLYPECTOMY  2016   TA  PROSTATE BIOPSY     RADIOACTIVE SEED IMPLANT N/A 08/13/2022   Procedure: RADIOACTIVE SEED IMPLANT/BRACHYTHERAPY IMPLANT;  Surgeon: Belva Agee, MD;  Location: [facility];  Service: Urology;  Laterality: N/A;  90 MINS   ROTATOR CUFF REPAIR Left    SPACE OAR INSTILLATION N/A 08/13/2022   Procedure: SPACE OAR INSTILLATION;  Surgeon: Belva Agee, MD;  Location: [facility];  Service: Urology;  Laterality: N/A;   TONSILLECTOMY     WISDOM TOOTH EXTRACTION     Family History  Problem Relation Age of Onset   Stroke Mother 30   Healthy Sister    Healthy Sister    Colon cancer Neg Hx    Colon polyps Neg Hx     Esophageal cancer Neg Hx    Stomach cancer Neg Hx    Rectal cancer Neg Hx    Social History   Socioeconomic History   Marital status: Married    Spouse name: Not on file   Number of children: Not on file   Years of education: Not on file   Highest education level: Not on file  Occupational History   Not on file  Tobacco Use   Smoking status: Never   Smokeless tobacco: Never  Vaping Use   Vaping status: Never Used  Substance and Sexual Activity   Alcohol use: Not Currently    Alcohol/week: 1.0 standard drink of alcohol    Types: 1 Standard drinks or equivalent per week   Drug use: No   Sexual activity: Yes    Birth control/protection: None  Other Topics Concern   Not on file  Social History Narrative   Not on file   Social Determinants of Health   Financial Resource Strain: Low Risk  (09/22/2022)   Overall Financial Resource Strain (CARDIA)    Difficulty of Paying Living Expenses: Not hard at all  Food Insecurity: No Food Insecurity (09/22/2022)   Hunger Vital Sign    Worried About Running Out of Food in the Last Year: Never true    Ran Out of Food in the Last Year: Never true  Transportation Needs: No Transportation Needs (09/22/2022)   PRAPARE - Administrator, Civil Service (Medical): No    Lack of Transportation (Non-Medical): No  Physical Activity: Insufficiently Active (09/22/2022)   Exercise Vital Sign    Days of Exercise per Week: 3 days    Minutes of Exercise per Session: 40 min  Stress: No Stress Concern Present (09/22/2022)   [employer] of Occupational Health - Occupational Stress Questionnaire    Feeling of Stress : Only a little  Social Connections: Unknown (09/22/2022)   Social Connection and Isolation Panel [NHANES]    Frequency of Communication with Friends and Family: More than three times a week    Frequency of Social Gatherings with Friends and Family: Twice a week    Attends Religious Services: Not on Energy manager or Organizations: Yes    Attends Banker Meetings: 1 to 4 times per year    Marital Status: Married    Tobacco Counseling Counseling given: Not Answered   Clinical Intake:  Pre-visit preparation completed: Yes  Pain : No/denies pain  Nutritional Risks: None Diabetes: No  How often do you need to have someone help you when you read instructions, pamphlets, or other written materials from your doctor or pharmacy?: 1 - Never  Interpreter Needed?: No  Information entered by ::  Donne Anon, CMA   Activities of Daily Living    09/22/2022    9:08 AM 08/13/2022    5:49 AM  In your present state of health, do you have any difficulty performing the following activities:  Hearing? 0 0  Vision? 0 0  Difficulty concentrating or making decisions? 0 0  Walking or climbing stairs? 0 0  Dressing or bathing? 0 0  Doing errands, shopping? 0   Preparing Food and eating ? N   Using the Toilet? N   In the past six months, have you accidently leaked urine? N   Do you have problems with loss of bowel control? N   Managing your Medications? N   Managing your Finances? N   Housekeeping or managing your Housekeeping? N     Patient Care Team: Copland, Gwenlyn Found, MD as PCP - General (Family Medicine) Cherlyn Cushing, RN as Oncology Nurse Navigator  Indicate any recent Medical Services you may have received from other than [facility] providers in the past year (date may be approximate).     Assessment:   This is a routine wellness examination for Carolyn.  Hearing/Vision screen No results found.  Dietary issues and exercise activities discussed:     Goals Addressed   None    Depression Screen    09/23/2022    8:23 AM 05/27/2022    9:52 AM 04/23/2022    3:59 PM 03/06/2022    8:42 AM 10/17/2021    8:16 AM 09/10/2021   12:36 PM 03/08/2021   10:54 AM  PHQ 2/9 Scores  PHQ - 2 Score 0 0 0 0 0 0 0    Fall Risk    09/22/2022    9:08 AM 04/23/2022    3:59 PM 03/06/2022     8:41 AM 10/17/2021    8:16 AM 09/10/2021   12:34 PM  Fall Risk   Falls in the past year? 0 0 0 0 0  Number falls in past yr: 0 0 0 0 0  Injury with Fall? 0 0 0 0 0  Risk for fall due to : No Fall Risks No Fall Risks No Fall Risks    Follow up Falls evaluation completed Falls evaluation completed Falls evaluation completed  Falls prevention discussed    MEDICARE RISK AT HOME:  Medicare Risk at Home - 09/23/22 0826     Any stairs in or around the home? Yes    If so, are there any without handrails? No    Home free of loose throw rugs in walkways, pet beds, electrical cords, etc? No    Adequate lighting in your home to reduce risk of falls? Yes    Life alert? No    Use of a cane, walker or w/c? No    Grab bars in the bathroom? No    Shower chair or bench in shower? Yes    Elevated toilet seat or a handicapped toilet? No             TIMED UP AND GO:  Was the test performed?  No    Cognitive Function:        09/23/2022    8:25 AM  6CIT Screen  What Year? 0 points  What month? 0 points  What time? 0 points  Count back from 20 0 points  Months in reverse 0 points  Repeat phrase 0 points  Total Score 0 points    Immunizations Immunization History  Administered Date(s) Administered   Influenza-Unspecified 12/10/2019,  01/04/2021, 12/18/2021   PFIZER(Purple Top)SARS-COV-2 Vaccination 04/02/2019, 04/22/2019, 12/24/2019, 12/23/2020   Pneumococcal Conjugate-13 11/10/2018   Pneumococcal Polysaccharide-23 12/10/2018   Tdap 07/09/2017    TDAP status: Up to date  Flu Vaccine status: Up to date  Pneumococcal vaccine status: Up to date  Covid-19 vaccine status: Information provided on how to obtain vaccines.   Qualifies for Shingles Vaccine? Yes   Zostavax completed No   Shingrix Completed?: No.    Education has been provided regarding the importance of this vaccine. Patient has been advised to call insurance company to determine out of pocket expense if they have not yet  received this vaccine. Advised may also receive vaccine at local pharmacy or Health Dept. Verbalized acceptance and understanding.  Screening Tests Health Maintenance  Topic Date Due   COVID-19 Vaccine (5 - 2023-24 season) 11/09/2021   Medicare Annual Wellness (AWV)  09/11/2022   INFLUENZA VACCINE  10/10/2022   Colonoscopy  02/22/2023   DTaP/Tdap/Td (2 - Td or Tdap) 07/10/2027   Pneumonia Vaccine 24+ Years old  Completed   Hepatitis C Screening  Completed   HPV VACCINES  Aged Out   Zoster Vaccines- Shingrix  Discontinued    Health Maintenance  Health Maintenance Due  Topic Date Due   COVID-19 Vaccine (5 - 2023-24 season) 11/09/2021   Medicare Annual Wellness (AWV)  09/11/2022    Colorectal cancer screening: Type of screening: Colonoscopy. Completed 02/22/20. Repeat every 3 years  Lung Cancer Screening: (Low Dose CT Chest recommended if Age 84-80 years, 20 pack-year currently smoking OR have quit w/in 15years.) does not qualify.   Additional Screening:  Hepatitis C Screening: does qualify; Completed 10/04/19  Vision Screening: Recommended annual ophthalmology exams for early detection of glaucoma and other disorders of the eye. Is the patient up to date with their annual eye exam?  Yes  Who is the provider or what is the name of the office in which the patient attends annual eye exams? [facility] In [place] If pt is not established with a provider, would they like to be referred to a provider to establish care? No .   Dental Screening: Recommended annual dental exams for proper oral hygiene  Diabetic Foot Exam: N/a  Community Resource Referral / Chronic Care Management: CRR required this visit?  No   CCM required this visit?  No     Plan:     I have personally reviewed and noted the following in the patient's chart:   Medical and social history Use of alcohol, tobacco or illicit drugs  Current medications and supplements including opioid prescriptions.  Patient is not currently taking opioid prescriptions. Functional ability and status Nutritional status Physical activity Advanced directives List of other physicians Hospitalizations, surgeries, and ER visits in previous 12 months Vitals Screenings to include cognitive, depression, and falls Referrals and appointments  In addition, I have reviewed and discussed with patient certain preventive protocols, quality metrics, and best practice recommendations. A written personalized care plan for preventive services as well as general preventive health recommendations were provided to patient.     Donne Anon, CMA   09/23/2022   After Visit Summary: (MyChart) Due to this being a telephonic visit, the after visit summary with patients personalized plan was offered to patient via MyChart   Nurse Notes: None

## 2022-09-23 NOTE — Patient Instructions (Signed)
Johnny Davenport , Thank you for taking time to come for your Medicare Wellness Visit. I appreciate your ongoing commitment to your health goals. Please review the following plan we discussed and let me know if I can assist you in the future.     This is a list of the screening recommended for you and due dates:  Health Maintenance  Topic Date Due   COVID-19 Vaccine (5 - 2023-24 season) 11/09/2021   Flu Shot  10/10/2022   Colon Cancer Screening  02/22/2023   Medicare Annual Wellness Visit  09/23/2023   DTaP/Tdap/Td vaccine (2 - Td or Tdap) 07/10/2027   Pneumonia Vaccine  Completed   Hepatitis C Screening  Completed   HPV Vaccine  Aged Out   Zoster (Shingles) Vaccine  Discontinued    Next appointment: Follow up in one year for your annual wellness visit.   Preventive Care 74 Years and Older, Male Preventive care refers to lifestyle choices and visits with your health care provider that can promote health and wellness. What does preventive care include? A yearly physical exam. This is also called an annual well check. Dental exams once or twice a year. Routine eye exams. Ask your health care provider how often you should have your eyes checked. Personal lifestyle choices, including: Daily care of your teeth and gums. Regular physical activity. Eating a healthy diet. Avoiding tobacco and drug use. Limiting alcohol use. Practicing safe sex. Taking low doses of aspirin every day. Taking vitamin and mineral supplements as recommended by your health care provider. What happens during an annual well check? The services and screenings done by your health care provider during your annual well check will depend on your age, overall health, lifestyle risk factors, and family history of disease. Counseling  Your health care provider may ask you questions about your: Alcohol use. Tobacco use. Drug use. Emotional well-being. Home and relationship well-being. Sexual activity. Eating  habits. History of falls. Memory and ability to understand (cognition). Work and work Astronomer. Screening  You may have the following tests or measurements: Height, weight, and BMI. Blood pressure. Lipid and cholesterol levels. These may be checked every 5 years, or more frequently if you are over 20 years old. Skin check. Lung cancer screening. You may have this screening every year starting at age 70 if you have a 30-pack-year history of smoking and currently smoke or have quit within the past 15 years. Fecal occult blood test (FOBT) of the stool. You may have this test every year starting at age 70. Flexible sigmoidoscopy or colonoscopy. You may have a sigmoidoscopy every 5 years or a colonoscopy every 10 years starting at age 29. Prostate cancer screening. Recommendations will vary depending on your family history and other risks. Hepatitis C blood test. Hepatitis B blood test. Sexually transmitted disease (STD) testing. Diabetes screening. This is done by checking your blood sugar (glucose) after you have not eaten for a while (fasting). You may have this done every 1-3 years. Abdominal aortic aneurysm (AAA) screening. You may need this if you are a current or former smoker. Osteoporosis. You may be screened starting at age 38 if you are at high risk. Talk with your health care provider about your test results, treatment options, and if necessary, the need for more tests. Vaccines  Your health care provider may recommend certain vaccines, such as: Influenza vaccine. This is recommended every year. Tetanus, diphtheria, and acellular pertussis (Tdap, Td) vaccine. You may need a Td booster every 10 years.  Zoster vaccine. You may need this after age 49. Pneumococcal 13-valent conjugate (PCV13) vaccine. One dose is recommended after age 27. Pneumococcal polysaccharide (PPSV23) vaccine. One dose is recommended after age 9. Talk to your health care provider about which screenings and  vaccines you need and how often you need them. This information is not intended to replace advice given to you by your health care provider. Make sure you discuss any questions you have with your health care provider. Document Released: 03/24/2015 Document Revised: 11/15/2015 Document Reviewed: 12/27/2014 Elsevier Interactive Patient Education  2017 [employer].  Fall Prevention in the Home Falls can cause injuries. They can happen to people of all ages. There are many things you can do to make your home safe and to help prevent falls. What can I do on the outside of my home? Regularly fix the edges of walkways and driveways and fix any cracks. Remove anything that might make you trip as you walk through a door, such as a raised step or threshold. Trim any bushes or trees on the path to your home. Use bright outdoor lighting. Clear any walking paths of anything that might make someone trip, such as rocks or tools. Regularly check to see if handrails are loose or broken. Make sure that both sides of any steps have handrails. Any raised decks and porches should have guardrails on the edges. Have any leaves, snow, or ice cleared regularly. Use sand or salt on walking paths during winter. Clean up any spills in your garage right away. This includes oil or grease spills. What can I do in the bathroom? Use night lights. Install grab bars by the toilet and in the tub and shower. Do not use towel bars as grab bars. Use non-skid mats or decals in the tub or shower. If you need to sit down in the shower, use a plastic, non-slip stool. Keep the floor dry. Clean up any water that spills on the floor as soon as it happens. Remove soap buildup in the tub or shower regularly. Attach bath mats securely with double-sided non-slip rug tape. Do not have throw rugs and other things on the floor that can make you trip. What can I do in the bedroom? Use night lights. Make sure that you have a light by your  bed that is easy to reach. Do not use any sheets or blankets that are too big for your bed. They should not hang down onto the floor. Have a firm chair that has side arms. You can use this for support while you get dressed. Do not have throw rugs and other things on the floor that can make you trip. What can I do in the kitchen? Clean up any spills right away. Avoid walking on wet floors. Keep items that you use a lot in easy-to-reach places. If you need to reach something above you, use a strong step stool that has a grab bar. Keep electrical cords out of the way. Do not use floor polish or wax that makes floors slippery. If you must use wax, use non-skid floor wax. Do not have throw rugs and other things on the floor that can make you trip. What can I do with my stairs? Do not leave any items on the stairs. Make sure that there are handrails on both sides of the stairs and use them. Fix handrails that are broken or loose. Make sure that handrails are as long as the stairways. Check any carpeting to make sure that  it is firmly attached to the stairs. Fix any carpet that is loose or worn. Avoid having throw rugs at the top or bottom of the stairs. If you do have throw rugs, attach them to the floor with carpet tape. Make sure that you have a light switch at the top of the stairs and the bottom of the stairs. If you do not have them, ask someone to add them for you. What else can I do to help prevent falls? Wear shoes that: Do not have high heels. Have rubber bottoms. Are comfortable and fit you well. Are closed at the toe. Do not wear sandals. If you use a stepladder: Make sure that it is fully opened. Do not climb a closed stepladder. Make sure that both sides of the stepladder are locked into place. Ask someone to hold it for you, if possible. Clearly mark and make sure that you can see: Any grab bars or handrails. First and last steps. Where the edge of each step is. Use tools that  help you move around (mobility aids) if they are needed. These include: Canes. Walkers. Scooters. Crutches. Turn on the lights when you go into a dark area. Replace any light bulbs as soon as they burn out. Set up your furniture so you have a clear path. Avoid moving your furniture around. If any of your floors are uneven, fix them. If there are any pets around you, be aware of where they are. Review your medicines with your doctor. Some medicines can make you feel dizzy. This can increase your chance of falling. Ask your doctor what other things that you can do to help prevent falls. This information is not intended to replace advice given to you by your health care provider. Make sure you discuss any questions you have with your health care provider. Document Released: 12/22/2008 Document Revised: 08/03/2015 Document Reviewed: 04/01/2014 Elsevier Interactive Patient Education  2017 [employer].

## 2022-09-26 IMAGING — DX DG HAND COMPLETE 3+V*R*
3 series · 3 of 3 positions shown · non-contrast
Comparison: None.

CLINICAL DATA: Right hand pain, third proximal interphalangeal
joint for 2-3 weeks

EXAM:
RIGHT HAND - COMPLETE 3+ VIEW

[hand pa]
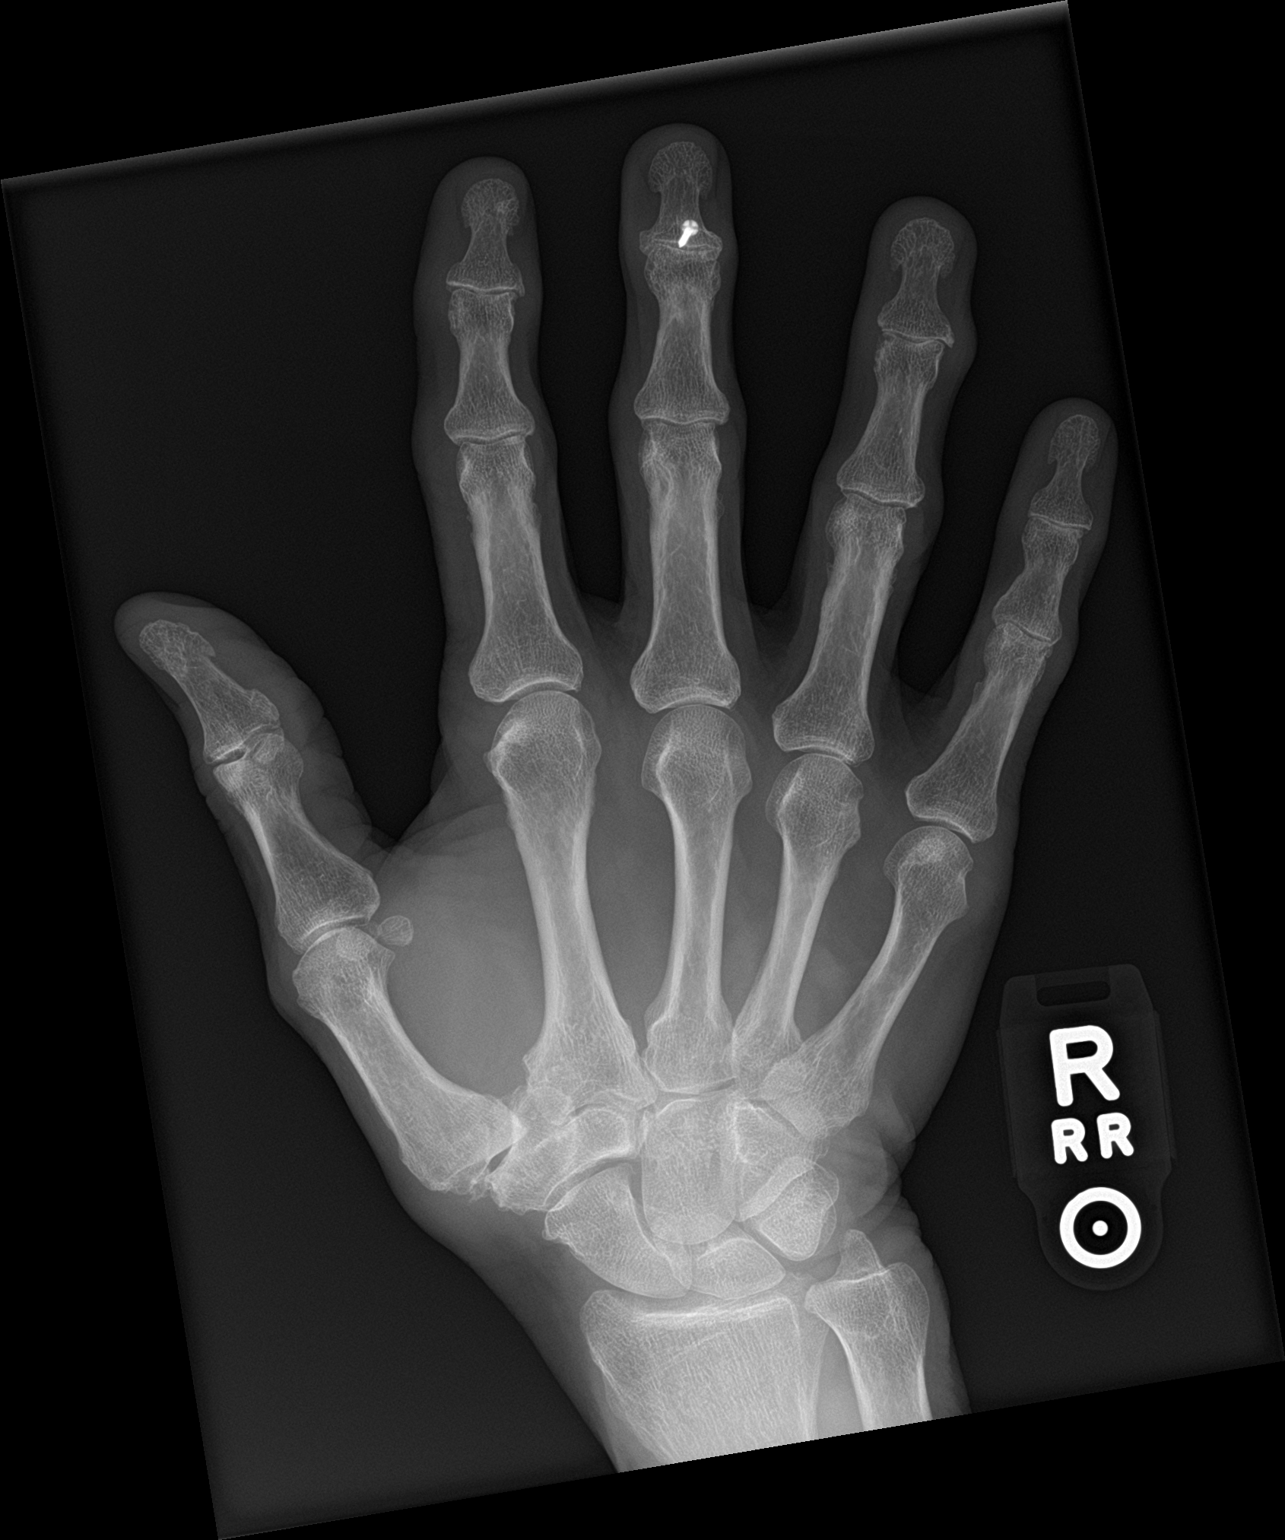

[hand obl]
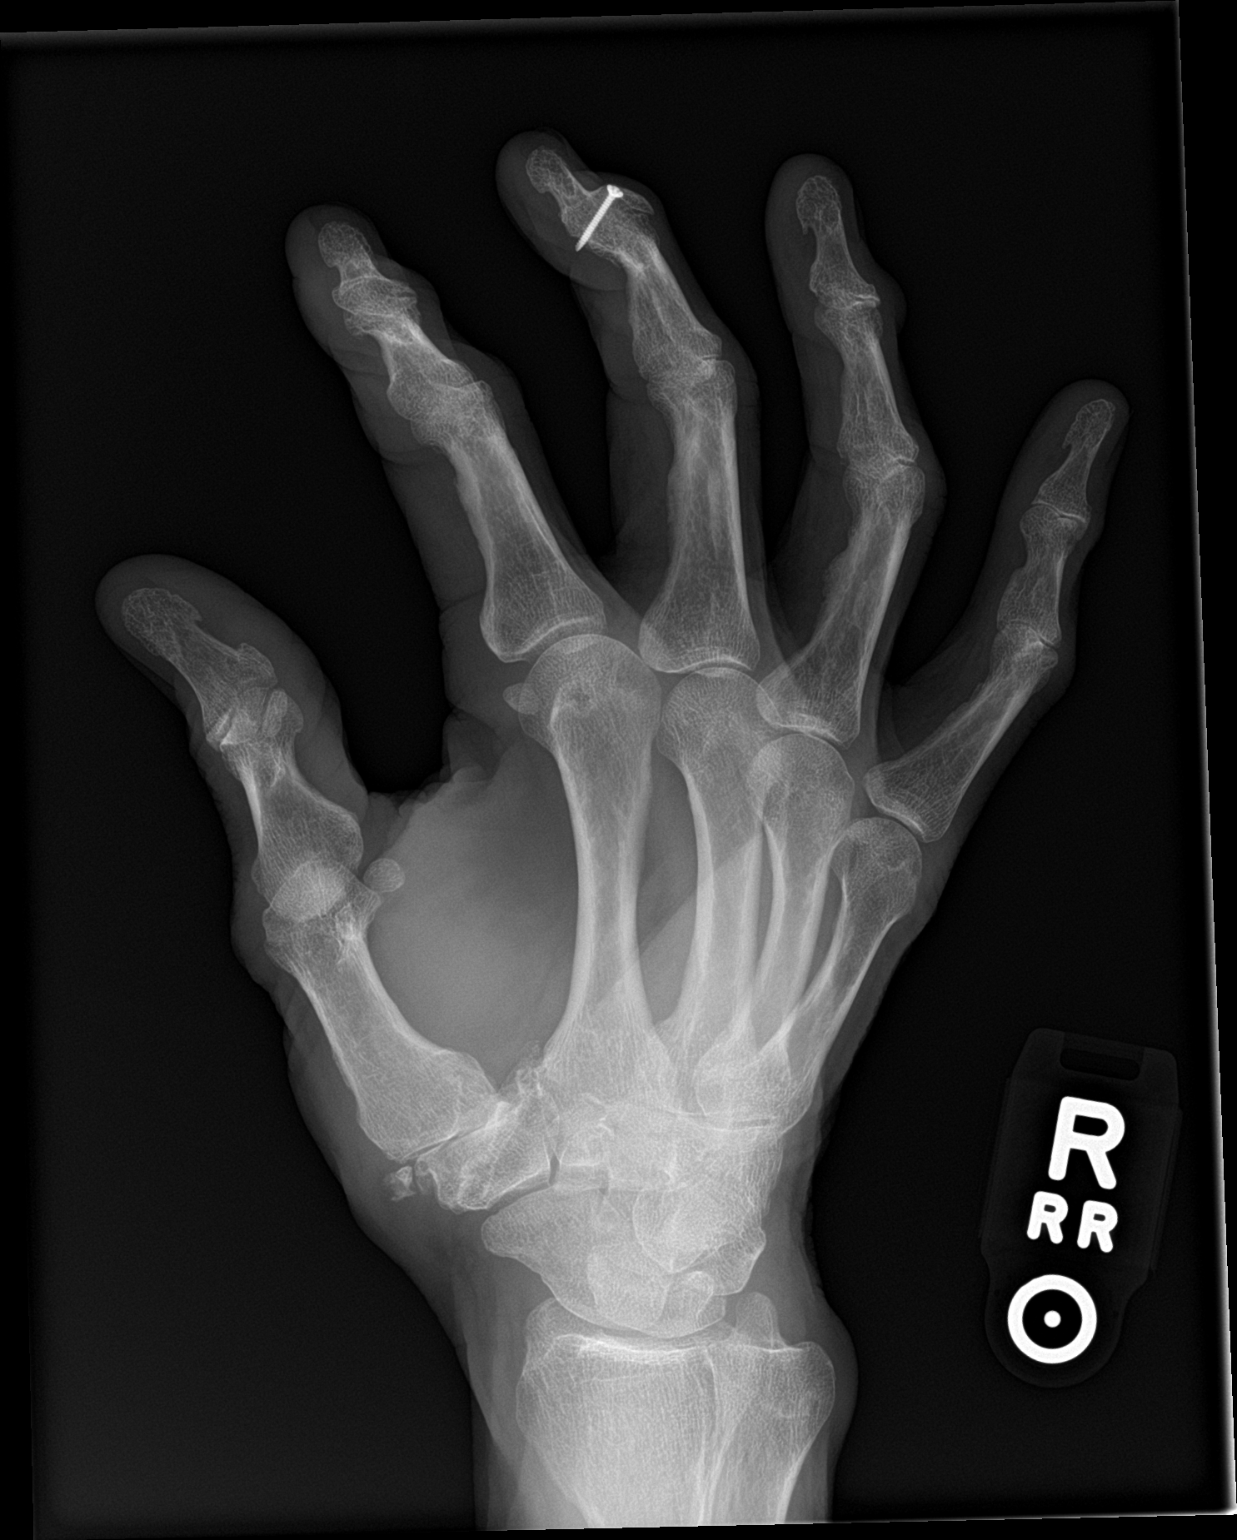

[hand lat]
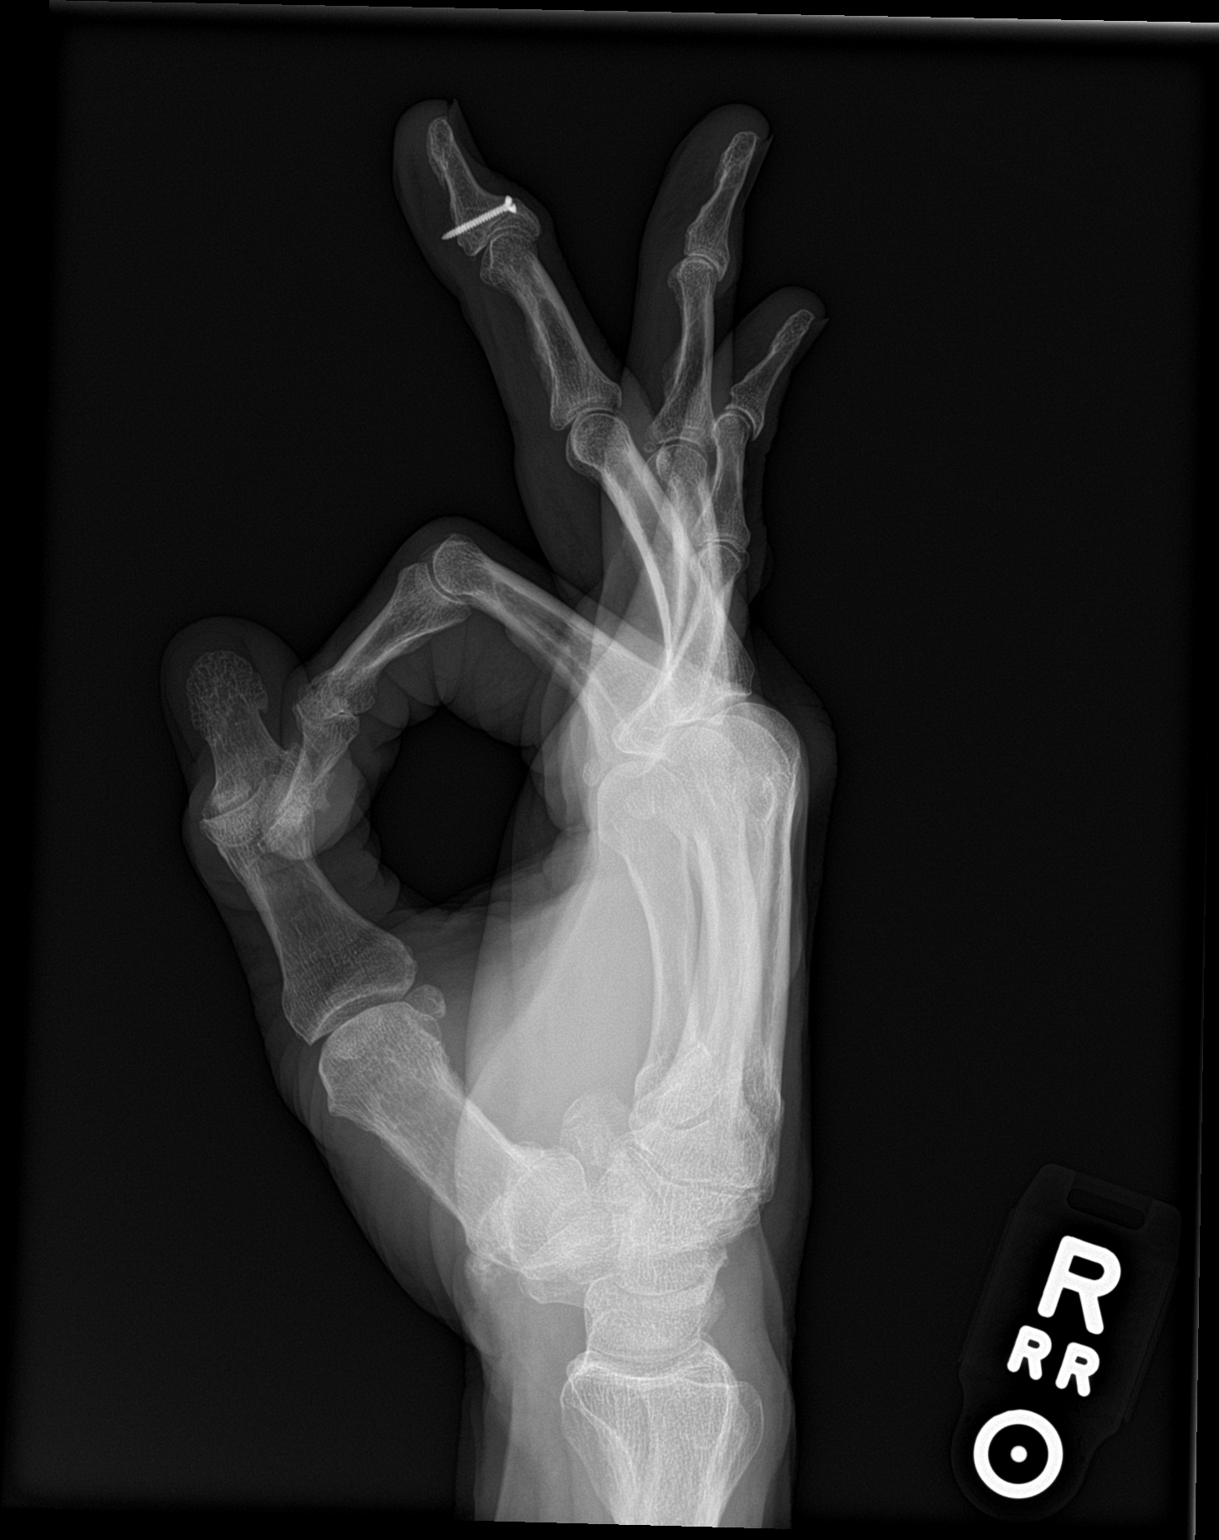

[3 of 3 positions shown; findings below may reference images not displayed]

FINDINGS: No fracture or dislocation of the right hand. Surgical screw in the
base of the right third distal phalanx. There is focally moderate
osteoarthritic degenerative change of the right thumb
carpometacarpal joint, with otherwise minimal osteoarthritic pattern
degenerative change of the distal interphalangeal joints. No
abnormality of the right third proximal interphalangeal joint. Soft
tissues are unremarkable.
IMPRESSION: 1.  No fracture or dislocation of the right hand.

2. No abnormality of the right third proximal interphalangeal joint
to explain pain.

## 2022-09-29 ENCOUNTER — Other Ambulatory Visit: Payer: Self-pay | Admitting: Family Medicine

## 2022-09-29 DIAGNOSIS — E785 Hyperlipidemia, unspecified: Secondary | ICD-10-CM

## 2022-10-01 ENCOUNTER — Encounter: Payer: Self-pay | Admitting: *Deleted

## 2022-10-01 ENCOUNTER — Inpatient Hospital Stay: Payer: Medicare Other | Attending: Adult Health | Admitting: *Deleted

## 2022-10-01 DIAGNOSIS — C61 Malignant neoplasm of prostate: Secondary | ICD-10-CM | POA: Insufficient documentation

## 2022-10-01 NOTE — Progress Notes (Signed)
Pt will have post treatment PSA labs on Sept 4th and sees Dr. Berneice Heinrich on Sept.10th. SCP reviewed and completed.

## 2022-10-16 NOTE — Patient Instructions (Addendum)
It was great to see you again today, I will be in touch with your labs as soon as possible Congratulations on your 48th anniversary! I will get you set up for a CT to evaluate borderline dilation of the aorta in your chest- will set up asap  Assuming all is well please see me in 6 months

## 2022-10-16 NOTE — Progress Notes (Signed)
[facility] at [facility] [address], [address] [place], [place] [address] [phone]  Date:  10/21/2022   Name:  Johnny Davenport   DOB:  April 29, 1950   MRN:  [id number]  PCP:  Pearline Cables, MD    Chief Complaint: Annual Exam   History of Present Illness:  Johnny Davenport is a 72 y.o. very pleasant male patient who presents with the following:  Pt seen today for CPE- history of prediabetes, dyslipidemia, prostate ca dx in the last year  He now has [facility] medicare Last visit with myself 3/24: He was in the ER on February 11 with nausea and vomiting complicated by syncope No sick contacts at home- we are not sure if he had food poisoning or a viral infection  He was seen by my partner Ria Clock nurse practitioner on 2/13-they went over his chest x-ray /CT scan from the ER which did show possible aspiration, patient was started on Augmentin and given incentive spirometer.  Plan to follow-up with me today At that time he had finished 10 days of Augmentin I ordered a repeat chest film but not done yet-however, as we plan to do a CT angiogram x-ray is likely not necessary He did get dx with prostate cancer in the interim - seeing Dr Benancio Deeds who did radioactive seed implantation in June  He notes he tolerated this procedure really well and is feeling well   Colonoscopy due this winter- they actually plan to do this in March due to his recent radioactive seeds  His migraines have responded well to the imitrex- responds well to imitrex, he may have a HA every 2 weeks or so   He does have a thoracic aortic aneurysm which we are following- need to update imaging soon CT 8/23: IMPRESSION: Mildly dilated ascending aorta, measuring up to 4.0 cm. Recommend annual imaging followup by CTA or MRA. This recommendation follows 2010 ACCF/AHA/AATS/ACR/ASA/SCA/SCAI/SIR/STS/SVM Guidelines for the Diagnosis and Management of Patients with Thoracic Aortic  Disease. Circulation. 2010; 121: G401-U272. Aortic aneurysm NOS (ICD10-I71.9)  Can update routine labs today  He notes his appetite has come back since his seeds were placed  Wt Readings from Last 3 Encounters:  10/21/22 184 lb 9.6 oz (83.7 kg)  09/04/22 181 lb (82.1 kg)  08/13/22 177 lb 11.2 oz (80.6 kg)   He likes to use the elliptical, golf, weights No CP or SOB   Patient Active Problem List   Diagnosis Date Noted   Malignant neoplasm of prostate (HCC) 05/28/2022   Prediabetes 05/13/2022   Aneurysm of thoracic aorta (HCC) 10/21/2021   Lateral epicondylitis, left elbow 11/16/2020   Arthritis of carpometacarpal [facility]) joint of right thumb 11/16/2020   Dyslipidemia 09/30/2020    Past Medical History:  Diagnosis Date   Allergy    seasonal allergies   Arthritis    bilateral thumbs   BPH (benign prostatic hyperplasia)    on meds   Elevated PSA    GERD (gastroesophageal reflux disease)    with certain foods/OTC meds for tx   Hyperlipidemia    on meds   Incomplete bladder emptying 12/28/2019   11/22/2019   migraine    Prostate cancer (HCC)    Wears glasses     Past Surgical History:  Procedure Laterality Date   COLONOSCOPY  2021   TA/hems-5 yr recall   Hand/Finger surgery  1997   KNEE ARTHROSCOPY W/ MENISCAL REPAIR Right    x 2  POLYPECTOMY  2016   TA   PROSTATE BIOPSY     RADIOACTIVE SEED IMPLANT N/A 08/13/2022   Procedure: RADIOACTIVE SEED IMPLANT/BRACHYTHERAPY IMPLANT;  Surgeon: Belva Agee, MD;  Location: [facility];  Service: Urology;  Laterality: N/A;  90 MINS   ROTATOR CUFF REPAIR Left    SPACE OAR INSTILLATION N/A 08/13/2022   Procedure: SPACE OAR INSTILLATION;  Surgeon: Belva Agee, MD;  Location: [facility];  Service: Urology;  Laterality: N/A;   TONSILLECTOMY     WISDOM TOOTH EXTRACTION      Social History   Tobacco Use   Smoking status: Never   Smokeless tobacco: Never  Vaping Use   Vaping status:  Never Used  Substance Use Topics   Alcohol use: Not Currently    Alcohol/week: 1.0 standard drink of alcohol    Types: 1 Standard drinks or equivalent per week   Drug use: No    Family History  Problem Relation Age of Onset   Stroke Mother 34   Healthy Sister    Healthy Sister    Colon cancer Neg Hx    Colon polyps Neg Hx    Esophageal cancer Neg Hx    Stomach cancer Neg Hx    Rectal cancer Neg Hx     Allergies  Allergen Reactions   Other Other (See Comments) and Cough    Pollen From Flowers    Medication list has been reviewed and updated.  Current Outpatient Medications on File Prior to Visit  Medication Sig Dispense Refill   omeprazole (PRILOSEC) 20 MG capsule Take 1 capsule (20 mg total) by mouth daily as needed. (Patient taking differently: Take 20 mg by mouth daily.) 90 capsule 3   rosuvastatin (CRESTOR) 20 MG tablet TAKE ONE TABLET BY MOUTH ONE TIME DAILY 90 tablet 0   silodosin (RAPAFLO) 8 MG CAPS capsule Take 8 mg by mouth daily.     SUMAtriptan (IMITREX) 50 MG tablet Take 1 tablet (50 mg total) by mouth every 2 (two) hours as needed for migraine. Max 100 mg in 24 hours 10 tablet 6   No current facility-administered medications on file prior to visit.    Review of Systems:  As per HPI- otherwise negative.   Physical Examination: Vitals:   10/21/22 0752  BP: 126/74  Pulse: 73  Resp: 18  Temp: 98.2 F (36.8 C)  SpO2: 98%   Vitals:   10/21/22 0752  Weight: 184 lb 9.6 oz (83.7 kg)  Height: 6' (1.829 m)   Body mass index is 25.04 kg/m. Ideal Body Weight: Weight in (lb) to have BMI = 25: 183.9  GEN: no acute distress.  Normal weight, looks well  HEENT: Atraumatic, Normocephalic. Bilateral TM wnl, oropharynx normal.  PEERL,EOMI.   Ears and Nose: No external deformity. CV: RRR, No M/G/R. No JVD. No thrill. No extra heart sounds. PULM: CTA B, no wheezes, crackles, rhonchi. No retractions. No resp. distress. No accessory muscle use. ABD: S, NT, ND. No  rebound. No HSM. EXTR: No c/c/e PSYCH: Normally interactive. Conversant.  Normal foot pulses   Assessment and Plan: Physical exam  Aneurysm of ascending aorta without rupture (HCC) - Plan: CT ANGIO CHEST AORTA W/CM & OR WO/CM  Prediabetes - Plan: Comprehensive metabolic panel, Hemoglobin A1c  Dyslipidemia - Plan: Lipid panel, rosuvastatin (CRESTOR) 20 MG tablet  Prostate cancer (HCC)  Screening for deficiency anemia - Plan: CBC  Migraine without aura and without status migrainosus, not intractable - Plan: SUMAtriptan (  IMITREX) 50 MG tablet  Physical exam today- recommend healthy diet and exercise routine Will plan further follow- up pending labs. Prostate cancer is being treated by urology, no need for PSA today Sumatriptan working well for headache control Signed Abbe Amsterdam, MD  Received labs as below, message to patient Results for orders placed or performed in visit on 10/21/22  CBC  Result Value Ref Range   WBC 5.3 4.0 - 10.5 K/uL   RBC 4.37 4.22 - 5.81 Mil/uL   Platelets 189.0 150.0 - 400.0 K/uL   Hemoglobin 13.9 13.0 - 17.0 g/dL   HCT 16.1 09.6 - 04.5 %   MCV 97.5 78.0 - 100.0 fl   MCHC 32.6 30.0 - 36.0 g/dL   RDW 40.9 81.1 - 91.4 %  Comprehensive metabolic panel  Result Value Ref Range   Sodium 137 135 - 145 mEq/L   Potassium 4.4 3.5 - 5.1 mEq/L   Chloride 101 96 - 112 mEq/L   CO2 31 19 - 32 mEq/L   Glucose, Bld 84 70 - 99 mg/dL   BUN 21 6 - 23 mg/dL   Creatinine, Ser 7.82 0.40 - 1.50 mg/dL   Total Bilirubin 0.7 0.2 - 1.2 mg/dL   Alkaline Phosphatase 80 39 - 117 U/L   AST 24 0 - 37 U/L   ALT 22 0 - 53 U/L   Total Protein 6.3 6.0 - 8.3 g/dL   Albumin 4.3 3.5 - 5.2 g/dL   GFR 95.62 >13.08 mL/min   Calcium 9.2 8.4 - 10.5 mg/dL  Hemoglobin M5H  Result Value Ref Range   Hgb A1c MFr Bld 5.7 4.6 - 6.5 %  Lipid panel  Result Value Ref Range   Cholesterol 123 0 - 200 mg/dL   Triglycerides 84.6 0.0 - 149.0 mg/dL   HDL 96.29 >52.84 mg/dL   VLDL 13.2  0.0 - 44.0 mg/dL   LDL Cholesterol 61 0 - 99 mg/dL   Total CHOL/HDL Ratio 3    NonHDL 79.45

## 2022-10-21 ENCOUNTER — Encounter: Payer: Self-pay | Admitting: Family Medicine

## 2022-10-21 ENCOUNTER — Ambulatory Visit (INDEPENDENT_AMBULATORY_CARE_PROVIDER_SITE_OTHER): Payer: Medicare Other | Admitting: Family Medicine

## 2022-10-21 VITALS — BP 126/74 | HR 73 | Temp 98.2°F | Resp 18 | Ht 72.0 in | Wt 184.6 lb

## 2022-10-21 DIAGNOSIS — I7121 Aneurysm of the ascending aorta, without rupture: Secondary | ICD-10-CM | POA: Diagnosis not present

## 2022-10-21 DIAGNOSIS — Z130 Encounter for screening for diseases of the blood and blood-forming organs and certain disorders involving the immune mechanism: Secondary | ICD-10-CM | POA: Diagnosis not present

## 2022-10-21 DIAGNOSIS — R7303 Prediabetes: Secondary | ICD-10-CM | POA: Diagnosis not present

## 2022-10-21 DIAGNOSIS — E785 Hyperlipidemia, unspecified: Secondary | ICD-10-CM

## 2022-10-21 DIAGNOSIS — C61 Malignant neoplasm of prostate: Secondary | ICD-10-CM

## 2022-10-21 DIAGNOSIS — G43009 Migraine without aura, not intractable, without status migrainosus: Secondary | ICD-10-CM | POA: Diagnosis not present

## 2022-10-21 DIAGNOSIS — Z0000 Encounter for general adult medical examination without abnormal findings: Secondary | ICD-10-CM

## 2022-10-21 LAB — LIPID PANEL
Cholesterol: 123 mg/dL (ref 0–200)
HDL: 43.6 mg/dL (ref >39.00)
LDL Cholesterol: 61 mg/dL (ref 0–99)
NonHDL: 79.45
Total CHOL/HDL Ratio: 3
Triglycerides: 91 mg/dL (ref 0.0–149.0)
VLDL: 18.2 mg/dL (ref 0.0–40.0)

## 2022-10-21 LAB — COMPREHENSIVE METABOLIC PANEL
ALT: 22 U/L (ref 0–53)
AST: 24 U/L (ref 0–37)
Albumin: 4.3 g/dL (ref 3.5–5.2)
Alkaline Phosphatase: 80 U/L (ref 39–117)
BUN: 21 mg/dL (ref 6–23)
CO2: 31 mEq/L (ref 19–32)
Calcium: 9.2 mg/dL (ref 8.4–10.5)
Chloride: 101 mEq/L (ref 96–112)
Creatinine, Ser: 1.01 mg/dL (ref 0.40–1.50)
GFR: 73.59 mL/min (ref >60.00)
Glucose, Bld: 84 mg/dL (ref 70–99)
Potassium: 4.4 mEq/L (ref 3.5–5.1)
Sodium: 137 mEq/L (ref 135–145)
Total Bilirubin: 0.7 mg/dL (ref 0.2–1.2)
Total Protein: 6.3 g/dL (ref 6.0–8.3)

## 2022-10-21 LAB — CBC
HCT: 42.6 % (ref 39.0–52.0)
Hemoglobin: 13.9 g/dL (ref 13.0–17.0)
MCHC: 32.6 g/dL (ref 30.0–36.0)
MCV: 97.5 fl (ref 78.0–100.0)
Platelets: 189 10*3/uL (ref 150.0–400.0)
RBC: 4.37 Mil/uL (ref 4.22–5.81)
RDW: 13.3 % (ref 11.5–15.5)
WBC: 5.3 10*3/uL (ref 4.0–10.5)

## 2022-10-21 LAB — HEMOGLOBIN A1C: Hgb A1c MFr Bld: 5.7 % (ref 4.6–6.5)

## 2022-10-21 MED ORDER — SUMATRIPTAN SUCCINATE 50 MG PO TABS
50.0000 mg | ORAL_TABLET | ORAL | 6 refills | Status: DC | PRN
Start: 2022-10-21 — End: 2023-06-12

## 2022-10-21 MED ORDER — ROSUVASTATIN CALCIUM 20 MG PO TABS: 20.0000 mg | ORAL_TABLET | Freq: Every day | ORAL | 3 refills | Status: DC

## 2022-10-22 DIAGNOSIS — L821 Other seborrheic keratosis: Secondary | ICD-10-CM | POA: Diagnosis not present

## 2022-10-22 DIAGNOSIS — L570 Actinic keratosis: Secondary | ICD-10-CM | POA: Diagnosis not present

## 2022-10-22 DIAGNOSIS — D485 Neoplasm of uncertain behavior of skin: Secondary | ICD-10-CM | POA: Diagnosis not present

## 2022-10-22 DIAGNOSIS — D1724 Benign lipomatous neoplasm of skin and subcutaneous tissue of left leg: Secondary | ICD-10-CM | POA: Diagnosis not present

## 2022-10-24 ENCOUNTER — Telehealth: Payer: Self-pay | Admitting: *Deleted

## 2022-10-24 NOTE — Telephone Encounter (Signed)
RETURNED PATIENT'S PHONE CALL, SPOKE WITH PATIENT. ?

## 2022-10-25 ENCOUNTER — Ambulatory Visit (HOSPITAL_BASED_OUTPATIENT_CLINIC_OR_DEPARTMENT_OTHER)
Admission: RE | Admit: 2022-10-25 | Discharge: 2022-10-25 | Disposition: A | Discharge status: Home / Self Care | Payer: Medicare Other | Source: Ambulatory Visit | Attending: Family Medicine | Admitting: Family Medicine

## 2022-10-25 DIAGNOSIS — I77810 Thoracic aortic ectasia: Secondary | ICD-10-CM | POA: Diagnosis not present

## 2022-10-25 DIAGNOSIS — I7121 Aneurysm of the ascending aorta, without rupture: Secondary | ICD-10-CM | POA: Diagnosis not present

## 2022-10-25 MED ORDER — IOHEXOL 350 MG/ML SOLN
100.0000 mL | Freq: Once | INTRAVENOUS | Status: AC | PRN
  Administered 2022-10-25: 100 mL via INTRAVENOUS

## 2022-10-26 ENCOUNTER — Encounter: Payer: Self-pay | Admitting: Family Medicine

## 2022-11-19 DIAGNOSIS — R350 Frequency of micturition: Secondary | ICD-10-CM | POA: Diagnosis not present

## 2022-11-27 NOTE — Progress Notes (Unsigned)
[facility] at [facility] [address] [place], [place] [address] [phone]  Date:  11/28/2022   Name:  Johnny Davenport   DOB:  March 20, 1950   MRN:  [id number]  PCP:  Pearline Cables, MD    Chief Complaint: No chief complaint on file.   History of Present Illness:  Johnny Davenport is a 72 y.o. very pleasant male patient who presents with the following:  Patient seen today with concern of hip pain Most recent visit with myself was in August for his physical History of prostate cancer, prediabetes, dyslipidemia He was treated with radioactive seed implant of June of this year/2024  He does have a thoracic aortic aneurysm which we are monitoring, most recent CT was stable in August  Patient Active Problem List   Diagnosis Date Noted   Malignant neoplasm of prostate (HCC) 05/28/2022   Prediabetes 05/13/2022   Aneurysm of thoracic aorta (HCC) 10/21/2021   Lateral epicondylitis, left elbow 11/16/2020   Arthritis of carpometacarpal [facility]) joint of right thumb 11/16/2020   Dyslipidemia 09/30/2020    Past Medical History:  Diagnosis Date   Allergy    seasonal allergies   Arthritis    bilateral thumbs   BPH (benign prostatic hyperplasia)    on meds   Elevated PSA    GERD (gastroesophageal reflux disease)    with certain foods/OTC meds for tx   Hyperlipidemia    on meds   Incomplete bladder emptying 12/28/2019   11/22/2019   migraine    Prostate cancer (HCC)    Wears glasses     Past Surgical History:  Procedure Laterality Date   COLONOSCOPY  2021   TA/hems-5 yr recall   Hand/Finger surgery  1997   KNEE ARTHROSCOPY W/ MENISCAL REPAIR Right    x 2   POLYPECTOMY  2016   TA   PROSTATE BIOPSY     RADIOACTIVE SEED IMPLANT N/A 08/13/2022   Procedure: RADIOACTIVE SEED IMPLANT/BRACHYTHERAPY IMPLANT;  Surgeon: Belva Agee, MD;  Location: [facility];  Service: Urology;  Laterality: N/A;  90 MINS   ROTATOR CUFF  REPAIR Left    SPACE OAR INSTILLATION N/A 08/13/2022   Procedure: SPACE OAR INSTILLATION;  Surgeon: Belva Agee, MD;  Location: [facility];  Service: Urology;  Laterality: N/A;   TONSILLECTOMY     WISDOM TOOTH EXTRACTION      Social History   Tobacco Use   Smoking status: Never   Smokeless tobacco: Never  Vaping Use   Vaping status: Never Used  Substance Use Topics   Alcohol use: Not Currently    Alcohol/week: 1.0 standard drink of alcohol    Types: 1 Standard drinks or equivalent per week   Drug use: No    Family History  Problem Relation Age of Onset   Stroke Mother 31   Healthy Sister    Healthy Sister    Colon cancer Neg Hx    Colon polyps Neg Hx    Esophageal cancer Neg Hx    Stomach cancer Neg Hx    Rectal cancer Neg Hx     Allergies  Allergen Reactions   Other Other (See Comments) and Cough    Pollen From Flowers    Medication list has been reviewed and updated.  Current Outpatient Medications on File Prior to Visit  Medication Sig Dispense Refill   omeprazole (PRILOSEC) 20 MG capsule Take 1 capsule (20 mg total) by  mouth daily as needed. (Patient taking differently: Take 20 mg by mouth daily.) 90 capsule 3   rosuvastatin (CRESTOR) 20 MG tablet Take 1 tablet (20 mg total) by mouth daily. 90 tablet 3   silodosin (RAPAFLO) 8 MG CAPS capsule Take 8 mg by mouth daily.     SUMAtriptan (IMITREX) 50 MG tablet Take 1 tablet (50 mg total) by mouth every 2 (two) hours as needed for migraine. Max 100 mg in 24 hours 10 tablet 6   No current facility-administered medications on file prior to visit.    Review of Systems:  As per HPI- otherwise negative.   Physical Examination: There were no vitals filed for this visit. There were no vitals filed for this visit. There is no height or weight on file to calculate BMI. Ideal Body Weight:    GEN: no acute distress. HEENT: Atraumatic, Normocephalic.  Ears and Nose: No external deformity. CV:  RRR, No M/G/R. No JVD. No thrill. No extra heart sounds. PULM: CTA B, no wheezes, crackles, rhonchi. No retractions. No resp. distress. No accessory muscle use. ABD: S, NT, ND, +BS. No rebound. No HSM. EXTR: No c/c/e PSYCH: Normally interactive. Conversant.    Assessment and Plan: ***  Signed Abbe Amsterdam, MD

## 2022-11-28 ENCOUNTER — Ambulatory Visit (HOSPITAL_BASED_OUTPATIENT_CLINIC_OR_DEPARTMENT_OTHER)
Admission: RE | Admit: 2022-11-28 | Discharge: 2022-11-28 | Disposition: A | Discharge status: Home / Self Care | Payer: Medicare Other | Source: Ambulatory Visit | Attending: Family Medicine | Admitting: Family Medicine

## 2022-11-28 ENCOUNTER — Ambulatory Visit (INDEPENDENT_AMBULATORY_CARE_PROVIDER_SITE_OTHER): Payer: Medicare Other | Admitting: Family Medicine

## 2022-11-28 ENCOUNTER — Encounter: Payer: Self-pay | Admitting: Family Medicine

## 2022-11-28 VITALS — BP 136/82 | HR 65 | Resp 19 | Ht 72.0 in | Wt 185.8 lb

## 2022-11-28 DIAGNOSIS — Z23 Encounter for immunization: Secondary | ICD-10-CM | POA: Diagnosis not present

## 2022-11-28 DIAGNOSIS — S76012A Strain of muscle, fascia and tendon of left hip, initial encounter: Secondary | ICD-10-CM | POA: Diagnosis not present

## 2022-11-28 DIAGNOSIS — M160 Bilateral primary osteoarthritis of hip: Secondary | ICD-10-CM | POA: Diagnosis not present

## 2022-11-28 NOTE — Patient Instructions (Signed)
It was good to see you today- I am sorry you got hurt!  I think you have torn your glute muscle or upper hamstring.   Slowly increase activity as your pain allows- try to keep moving but don't do things that cause severe pain If you decide you want to do some PT I can set this up Continue NSAIDs, heat, might try a massage again?  I will be in touch with your x-rays asap  Let me know if you are not making gradual progress

## 2022-12-10 ENCOUNTER — Encounter: Payer: Self-pay | Admitting: Family Medicine

## 2022-12-16 ENCOUNTER — Other Ambulatory Visit: Payer: Self-pay | Admitting: Family Medicine

## 2022-12-16 ENCOUNTER — Encounter: Payer: Self-pay | Admitting: Family Medicine

## 2022-12-16 DIAGNOSIS — E785 Hyperlipidemia, unspecified: Secondary | ICD-10-CM

## 2022-12-16 MED ORDER — ROSUVASTATIN CALCIUM 20 MG PO TABS
20.0000 mg | ORAL_TABLET | Freq: Every day | ORAL | 3 refills | Status: DC
Start: 2022-12-16 — End: 2023-05-13

## 2023-01-09 ENCOUNTER — Encounter: Payer: Self-pay | Admitting: Family Medicine

## 2023-01-13 DIAGNOSIS — S76312D Strain of muscle, fascia and tendon of the posterior muscle group at thigh level, left thigh, subsequent encounter: Secondary | ICD-10-CM | POA: Diagnosis not present

## 2023-01-13 DIAGNOSIS — M25552 Pain in left hip: Secondary | ICD-10-CM | POA: Diagnosis not present

## 2023-01-22 DIAGNOSIS — S76312D Strain of muscle, fascia and tendon of the posterior muscle group at thigh level, left thigh, subsequent encounter: Secondary | ICD-10-CM | POA: Diagnosis not present

## 2023-01-22 DIAGNOSIS — M25552 Pain in left hip: Secondary | ICD-10-CM | POA: Diagnosis not present

## 2023-01-27 ENCOUNTER — Encounter: Payer: Self-pay | Admitting: Family Medicine

## 2023-01-27 DIAGNOSIS — K219 Gastro-esophageal reflux disease without esophagitis: Secondary | ICD-10-CM

## 2023-01-27 MED ORDER — OMEPRAZOLE 20 MG PO CPDR
20.0000 mg | DELAYED_RELEASE_CAPSULE | Freq: Every day | ORAL | 3 refills | Status: DC
Start: 2023-01-27 — End: 2023-05-07

## 2023-01-27 MED ORDER — OMEPRAZOLE 20 MG PO CPDR: 20.0000 mg | DELAYED_RELEASE_CAPSULE | Freq: Every day | ORAL | 0 refills | Status: DC

## 2023-01-27 NOTE — Telephone Encounter (Signed)
FYI:   - also I sent the Omeprazole.

## 2023-01-27 NOTE — Addendum Note (Signed)
Addended by: Abbe Amsterdam C on: 01/27/2023 10:06 AM   Modules accepted: Orders

## 2023-01-28 DIAGNOSIS — M25552 Pain in left hip: Secondary | ICD-10-CM | POA: Diagnosis not present

## 2023-01-28 DIAGNOSIS — H25093 Other age-related incipient cataract, bilateral: Secondary | ICD-10-CM | POA: Diagnosis not present

## 2023-01-28 DIAGNOSIS — S76312D Strain of muscle, fascia and tendon of the posterior muscle group at thigh level, left thigh, subsequent encounter: Secondary | ICD-10-CM | POA: Diagnosis not present

## 2023-01-30 DIAGNOSIS — M25552 Pain in left hip: Secondary | ICD-10-CM | POA: Diagnosis not present

## 2023-01-30 DIAGNOSIS — M25551 Pain in right hip: Secondary | ICD-10-CM | POA: Diagnosis not present

## 2023-01-31 DIAGNOSIS — M25552 Pain in left hip: Secondary | ICD-10-CM | POA: Diagnosis not present

## 2023-01-31 DIAGNOSIS — S76312D Strain of muscle, fascia and tendon of the posterior muscle group at thigh level, left thigh, subsequent encounter: Secondary | ICD-10-CM | POA: Diagnosis not present

## 2023-02-04 DIAGNOSIS — M25551 Pain in right hip: Secondary | ICD-10-CM | POA: Diagnosis not present

## 2023-02-04 DIAGNOSIS — M5416 Radiculopathy, lumbar region: Secondary | ICD-10-CM | POA: Diagnosis not present

## 2023-02-04 DIAGNOSIS — M25552 Pain in left hip: Secondary | ICD-10-CM | POA: Diagnosis not present

## 2023-02-04 DIAGNOSIS — M545 Low back pain, unspecified: Secondary | ICD-10-CM | POA: Diagnosis not present

## 2023-02-11 DIAGNOSIS — S76312D Strain of muscle, fascia and tendon of the posterior muscle group at thigh level, left thigh, subsequent encounter: Secondary | ICD-10-CM | POA: Diagnosis not present

## 2023-02-11 DIAGNOSIS — M25552 Pain in left hip: Secondary | ICD-10-CM | POA: Diagnosis not present

## 2023-02-13 DIAGNOSIS — M25552 Pain in left hip: Secondary | ICD-10-CM | POA: Diagnosis not present

## 2023-02-13 DIAGNOSIS — S76312D Strain of muscle, fascia and tendon of the posterior muscle group at thigh level, left thigh, subsequent encounter: Secondary | ICD-10-CM | POA: Diagnosis not present

## 2023-02-17 ENCOUNTER — Encounter: Payer: Self-pay | Admitting: Gastroenterology

## 2023-02-17 DIAGNOSIS — S76312D Strain of muscle, fascia and tendon of the posterior muscle group at thigh level, left thigh, subsequent encounter: Secondary | ICD-10-CM | POA: Diagnosis not present

## 2023-02-17 DIAGNOSIS — M25552 Pain in left hip: Secondary | ICD-10-CM | POA: Diagnosis not present

## 2023-02-20 DIAGNOSIS — S76312D Strain of muscle, fascia and tendon of the posterior muscle group at thigh level, left thigh, subsequent encounter: Secondary | ICD-10-CM | POA: Diagnosis not present

## 2023-02-20 DIAGNOSIS — M25552 Pain in left hip: Secondary | ICD-10-CM | POA: Diagnosis not present

## 2023-02-21 DIAGNOSIS — M5416 Radiculopathy, lumbar region: Secondary | ICD-10-CM | POA: Diagnosis not present

## 2023-02-21 DIAGNOSIS — M47816 Spondylosis without myelopathy or radiculopathy, lumbar region: Secondary | ICD-10-CM | POA: Diagnosis not present

## 2023-02-24 DIAGNOSIS — M25552 Pain in left hip: Secondary | ICD-10-CM | POA: Diagnosis not present

## 2023-02-24 DIAGNOSIS — S76312D Strain of muscle, fascia and tendon of the posterior muscle group at thigh level, left thigh, subsequent encounter: Secondary | ICD-10-CM | POA: Diagnosis not present

## 2023-03-06 DIAGNOSIS — S76312D Strain of muscle, fascia and tendon of the posterior muscle group at thigh level, left thigh, subsequent encounter: Secondary | ICD-10-CM | POA: Diagnosis not present

## 2023-03-06 DIAGNOSIS — M25552 Pain in left hip: Secondary | ICD-10-CM | POA: Diagnosis not present

## 2023-03-13 ENCOUNTER — Other Ambulatory Visit: Payer: Self-pay | Admitting: Family Medicine

## 2023-03-13 DIAGNOSIS — K219 Gastro-esophageal reflux disease without esophagitis: Secondary | ICD-10-CM

## 2023-03-17 ENCOUNTER — Encounter: Payer: Self-pay | Admitting: Family Medicine

## 2023-03-18 DIAGNOSIS — M25552 Pain in left hip: Secondary | ICD-10-CM | POA: Diagnosis not present

## 2023-03-27 DIAGNOSIS — R936 Abnormal findings on diagnostic imaging of limbs: Secondary | ICD-10-CM | POA: Diagnosis not present

## 2023-03-27 DIAGNOSIS — C7951 Secondary malignant neoplasm of bone: Secondary | ICD-10-CM | POA: Diagnosis not present

## 2023-03-27 DIAGNOSIS — S76012A Strain of muscle, fascia and tendon of left hip, initial encounter: Secondary | ICD-10-CM | POA: Diagnosis not present

## 2023-03-27 DIAGNOSIS — C801 Malignant (primary) neoplasm, unspecified: Secondary | ICD-10-CM | POA: Diagnosis not present

## 2023-03-27 DIAGNOSIS — M160 Bilateral primary osteoarthritis of hip: Secondary | ICD-10-CM | POA: Diagnosis not present

## 2023-03-27 DIAGNOSIS — S76312A Strain of muscle, fascia and tendon of the posterior muscle group at thigh level, left thigh, initial encounter: Secondary | ICD-10-CM | POA: Diagnosis not present

## 2023-03-28 ENCOUNTER — Encounter: Payer: Self-pay | Admitting: Family Medicine

## 2023-03-28 ENCOUNTER — Telehealth: Payer: Self-pay | Admitting: Family Medicine

## 2023-03-28 NOTE — Telephone Encounter (Signed)
Received a message from pt wife that MRI done at [facility] shows findings concerning for metastatic prostate cancer.  I am not able to see the MRI report but they sent me a screenshot He has history of prostate ca dx earlier this year  They are in touch with rx for Rad Onc Dr Kathrynn Running already and are working on making a plan

## 2023-03-28 NOTE — Progress Notes (Signed)
RN received call from patient with concerns of abnormal MRI findings.   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 Underwent brachytherapy treatment on 08/13/2022.   Patient had MRI from [facility] on 1/16 with findings of "osseous lesions throughout the pelvis and in the L4 and L5 vertebral bodies consistent with osseous metastatic disease."  Report provided in a mychart message from patient.  RN requested official report from [place].   RN reviewed findings with Marcello Fennel, PA-C and recommendations to have patient see urology for additional work up.  Patient is scheduled with Dr. Berneice Heinrich at [facility] on 1/21 @ 11:15.  Patient notified and is aware.

## 2023-04-01 ENCOUNTER — Encounter: Payer: Self-pay | Admitting: Orthopedic Surgery

## 2023-04-01 DIAGNOSIS — R350 Frequency of micturition: Secondary | ICD-10-CM | POA: Diagnosis not present

## 2023-04-01 DIAGNOSIS — C7951 Secondary malignant neoplasm of bone: Secondary | ICD-10-CM | POA: Diagnosis not present

## 2023-04-03 ENCOUNTER — Other Ambulatory Visit: Payer: Self-pay | Admitting: Urology

## 2023-04-03 DIAGNOSIS — C7951 Secondary malignant neoplasm of bone: Secondary | ICD-10-CM

## 2023-04-03 DIAGNOSIS — C61 Malignant neoplasm of prostate: Secondary | ICD-10-CM

## 2023-04-07 ENCOUNTER — Inpatient Hospital Stay
Admission: RE | Admit: 2023-04-07 | Discharge: 2023-04-07 | Disposition: A | Discharge status: Home / Self Care | Payer: Self-pay | Source: Ambulatory Visit | Attending: Radiation Oncology | Admitting: Radiation Oncology

## 2023-04-07 ENCOUNTER — Other Ambulatory Visit: Payer: Self-pay

## 2023-04-07 DIAGNOSIS — C61 Malignant neoplasm of prostate: Secondary | ICD-10-CM

## 2023-04-07 DIAGNOSIS — M899 Disorder of bone, unspecified: Secondary | ICD-10-CM | POA: Diagnosis not present

## 2023-04-07 DIAGNOSIS — M25552 Pain in left hip: Secondary | ICD-10-CM | POA: Diagnosis not present

## 2023-04-08 ENCOUNTER — Ambulatory Visit
Admission: RE | Admit: 2023-04-08 | Discharge: 2023-04-08 | Disposition: A | Discharge status: Home / Self Care | Payer: Medicare Other | Source: Ambulatory Visit | Attending: Urology | Admitting: Urology

## 2023-04-08 DIAGNOSIS — C7951 Secondary malignant neoplasm of bone: Secondary | ICD-10-CM | POA: Diagnosis not present

## 2023-04-08 DIAGNOSIS — C7800 Secondary malignant neoplasm of unspecified lung: Secondary | ICD-10-CM | POA: Diagnosis not present

## 2023-04-08 DIAGNOSIS — C779 Secondary and unspecified malignant neoplasm of lymph node, unspecified: Secondary | ICD-10-CM | POA: Diagnosis not present

## 2023-04-08 DIAGNOSIS — C61 Malignant neoplasm of prostate: Secondary | ICD-10-CM | POA: Insufficient documentation

## 2023-04-08 DIAGNOSIS — C775 Secondary and unspecified malignant neoplasm of intrapelvic lymph nodes: Secondary | ICD-10-CM | POA: Diagnosis not present

## 2023-04-08 MED ORDER — FLOTUFOLASTAT F 18 GALLIUM 296-5846 MBQ/ML IV SOLN
8.4900 | Freq: Once | INTRAVENOUS | Status: AC
  Administered 2023-04-08: 8.49 via INTRAVENOUS
  Filled 2023-04-08: qty 9

## 2023-04-09 ENCOUNTER — Encounter: Payer: Self-pay | Admitting: Family Medicine

## 2023-04-09 ENCOUNTER — Other Ambulatory Visit: Payer: Self-pay | Admitting: Urology

## 2023-04-09 ENCOUNTER — Telehealth: Payer: Self-pay | Admitting: Urology

## 2023-04-09 ENCOUNTER — Telehealth: Payer: Self-pay

## 2023-04-09 ENCOUNTER — Ambulatory Visit
Admission: RE | Admit: 2023-04-09 | Discharge: 2023-04-09 | Disposition: A | Discharge status: Home / Self Care | Payer: Medicare Other | Source: Ambulatory Visit | Attending: Radiation Oncology

## 2023-04-09 ENCOUNTER — Other Ambulatory Visit: Payer: Self-pay

## 2023-04-09 DIAGNOSIS — C61 Malignant neoplasm of prostate: Secondary | ICD-10-CM

## 2023-04-09 DIAGNOSIS — Z923 Personal history of irradiation: Secondary | ICD-10-CM | POA: Diagnosis not present

## 2023-04-09 NOTE — Telephone Encounter (Signed)
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 Patient proceed with treatment recommendations of brachytherapy on 08/13/22.  Received message patient had recent results of PET scan and was looking for information on next steps for treatment.  Ashlyn Bruning, PA-C and Dr. Kathrynn Running made aware.

## 2023-04-09 NOTE — Progress Notes (Signed)
Radiation Oncology         [phone]) [phone] ________________________________  Name: Johnny Davenport MRN: [id number]  Date: 04/10/2023  DOB: 1950-05-03  Post Treatment Follow up Note  CC: Copland, Gwenlyn Found, MD  Belva Agee, MD  Diagnosis:   73 y.o. gentleman with newly diagnosed widespread metastatic prostate cancer with bilateral painful hips and PSA of 107  Interval Since Last Radiation:  7 months  08/13/22:  Insertion of radioactive I-125 seeds into the prostate gland; 145 Gy, definitive therapy with placement of SpaceOAR gel for Stage T2 adenocarcinoma of the prostate with Gleason score of 3+4, and PSA of 7.3.   Narrative:  The patient returns today to review  results of his recent PSMA PET scan.  He has a history of T2 adenocarcinoma of the prostate with Gleason score 3+4 and a pretreatment PSA of 7.3.  He was treated with brachytherapy in June 2024 and appeared to have had a good response to treatment with a posttreatment PSA of 5.26 in September 2024. He had the PSMA PET scan on 04/08/2023 for further evaluation of suspicious bony metastases found incidentally on a recent MRI scan of the left hip for evaluation of persistent left hip pain that has been ongoing for several months now.  The PSMA PET scan shows extensive tracer avid skeletal metastases involving the axillary and appendicular skeleton as well as extensive tracer avid lymph node metastases in the pelvis, retroperitoneum and mediastinum.  Additionally, there are multiple tracer avid pulmonary metastases noted.   He is followed by Dr. Dwana Curd at [facility] and was last seen on 04/01/2023 regarding concerns for metastatic disease on the MRI scan.  The PSMA PET scan was scheduled at that time but he did not have a repeat PSA so we had him come for labs 04/09/23 and that confirms elevated PSA at 107.  He reports that the left hip pain has become debilitating and limiting his daily activities due to severe pain with any weightbearing.   He does have bilateral hip pain with left greater than right but denies any low back pain or other focal sites of pain associated with the osseous metastases.  He has not had any recent changes in his bowel or bladder function and denies any paresthesias or focal weakness in the upper or lower extremities.                               On review of systems, the patient states that his only complaint is the hip pain that is limiting his daily activities.  He denies any chest pain, shortness of breath, cough, fevers, chills, night sweats, or recent unintended weight changes. He denies any bowel or bladder disturbances, and denies abdominal pain, nausea or vomiting. He denies any other focal sites of musculoskeletal or joint aches or pains, new skin lesions or concerns. A complete review of systems is obtained and is otherwise negative.   ALLERGIES:  is allergic to other.  Meds: Current Outpatient Medications  Medication Sig Dispense Refill   omeprazole (PRILOSEC) 20 MG capsule Take 1 capsule (20 mg total) by mouth daily. 90 capsule 3   rosuvastatin (CRESTOR) 20 MG tablet Take 1 tablet (20 mg total) by mouth daily. 90 tablet 3   silodosin (RAPAFLO) 8 MG CAPS capsule Take 8 mg by mouth daily.     SUMAtriptan (IMITREX) 50 MG tablet Take 1 tablet (50 mg total) by mouth every  2 (two) hours as needed for migraine. Max 100 mg in 24 hours 10 tablet 6   No current facility-administered medications for this visit.    Physical Findings:  vitals were not taken for this visit.   /10 In general this is a well appearing Caucasian man in no acute distress. He's alert and oriented x4 and appropriate throughout the examination. Cardiopulmonary assessment is negative for acute distress and he exhibits normal effort.   Lab Findings: Lab Results  Component Value Date   WBC 5.3 10/21/2022   HGB 13.9 10/21/2022   HCT 42.6 10/21/2022   MCV 97.5 10/21/2022   PLT 189.0 10/21/2022     Radiographic Findings: NM  PET (PSMA) SKULL TO MID THIGH Result Date: 04/08/2023 CLINICAL DATA:  Prostate carcinoma with biochemical recurrence. Brachytherapy June 2024. EXAM: NUCLEAR MEDICINE PET SKULL BASE TO THIGH TECHNIQUE: mCi F18 Piflufolastat (Pylarify) was injected intravenously. Full-ring PET imaging was performed from the skull base to thigh after the radiotracer. CT data was obtained and used for attenuation correction and anatomic localization. COMPARISON:  None Available. FINDINGS: NECK No radiotracer activity in neck lymph nodes. Incidental CT finding: None. CHEST Multiple mediastinal and hilar radiotracer avid nodes. Example cluster of nodes in the LEFT hilum with SUV max equal 16.4. RIGHT lower paratracheal node measures 12 mm with SUV max equal 20 on image 59. Small pulmonary nodules in about the hilar structures with intense radiotracer activity. Example RIGHT upper lobe nodule measuring 6 mm (image 63/6) with SUV max 9. Example LEFT lobe nodule measuring 9 mm on image 64 SUV max equal 5.2. Approximately 10 radiotracer avid metastatic pulmonary nodules. Incidental CT finding: None. ABDOMEN/PELVIS Prostate: Brachytherapy seeds within the prostate gland. Clear abnormal radiotracer activity. Lymph nodes: Within the LEFT mesorectal sheath irregular soft tissue nodule measuring 3 cm (image 154) has intense metabolic activity with SUV max equal 16.5. Similar smaller node in the RIGHT mesorectum on image 154. Conglomerate of lymph nodes in the presacral space measures 33 mm on image 142 with SUV max equal 17.3. Scattered small radiotracer avid nodules along the periaortic retroperitoneum Liver: No evidence of liver metastasis. Incidental CT finding: None. SKELETON There is extensive intense radiotracer avid skeletal metastasis. Entirety of the posterior LEFT and RIGHT iliac bone and sacrum are involved. Example in the LEFT sacrum / iliac bone with SUV max equal 23 on image 138. There is only there is mild subtle haziness within  the bone this level. Multiple vertebral bodies are involved with radiotracer avid metastatic disease. For example L1 vertebral body with SUV max equal 24. Hypermetabolic metastasis extend in the shoulder girdles, proximal LEFT femur, and the RIGHT skull base. IMPRESSION: 1. Extensive radiotracer avid skeletal metastasis involving the axillary and appendicular skeleton. 2. Extensive radiotracer avid lymph node metastasis in the pelvis, retroperitoneum, and mediastinum. 3. Multiple radiotracer avid small pulmonary metastasis. Electronically Signed   By: Genevive Bi M.D.   On: 04/08/2023 16:40    Impression/Plan: 1. 73 y.o. gentleman with newly diagnosed widespread metastatic prostate cancer s/p brachytherapy in 08/2022 for Stage T2 adenocarcinoma of the prostate with Gleason score of 3+4, and PSA of 7.3.  Today, we talked to the patient and his wife, Rinaldo Cloud, about the findings and workup thus far. We discussed the natural history of metastatic prostate cancer and general treatment, highlighting the role of radiotherapy in the management, specifically, the potential role for palliative radiotherapy to the hips for more durable pain control and he is interested and anxious to proceed.  We also discussed the recommendation for referral to medical oncology to discuss systemic treatments for his metastatic prostate cancer and he is scheduled to meet with Dr. Cherly Hensen on 04/11/23.  He has freely signed written consent to proceed with the radiation today in the office and a copy of this document will be placed in his medical record.  He will proceed with CT simulation/treatment planning following our visit today, in anticipation of beginning the daily palliative radiation in the very near future.  We will share our discussion with his urologist, Dr. Berneice Heinrich and Dr. Cherly Hensen, in medical oncology.  He and his wife were encouraged to ask questions that were answered to their stated satisfaction and he appears to have a good  understanding of our recommendations and is comfortable and in agreement with the stated plan. He may also consider getting a second opinion with the team at [facility] or [facility], which we fully support. He will let us know once he makes a decision and we will gladly make a referral at that time.   We personally spent 60 minutes in this encounter including chart review, reviewing radiological studies, meeting face-to-face with the patient, entering orders, coordinating care and completing documentation.     Marguarite Arbour, PA-C    Margaretmary Dys, MD  [facility]  Radiation Oncology Direct Dial: [phone]  Fax: [phone] [email or web address]  Skype  LinkedIn

## 2023-04-09 NOTE — Telephone Encounter (Signed)
RN called Johnny Davenport per Dr. Kathrynn Running to see if he would be available to come in for PSA lab.  [name] agreed and will be in about 30 minutes.  Dr. Kathrynn Running made aware that patient will be in for lab test this afternoon.

## 2023-04-09 NOTE — Addendum Note (Signed)
Addended by: Abbe Amsterdam C on: 04/09/2023 04:20 PM   Modules accepted: Orders

## 2023-04-09 NOTE — Telephone Encounter (Signed)
I returned the patient's call regarding results of his recent PSMA PET scan.  He had the PSMA PET scan on 04/08/2023 for further evaluation of suspicious bony metastases found incidentally on a recent MRI scan of the left hip for evaluation of persistent left hip pain that has been ongoing for several months now.  The PSMA PET scan shows extensive tracer avid skeletal metastases involving the axillary and appendicular skeleton as well as extensive tracer avid lymph node metastases in the pelvis, retroperitoneum and mediastinum.  Additionally, there are multiple tracer avid pulmonary metastases noted.  We reviewed these results by telephone today.  He has a history of T2 adenocarcinoma of the prostate with Gleason score 3+4 and a pretreatment PSA of 7.3.  He was treated with brachytherapy in June 2024 and appeared to have had a good response to treatment with a posttreatment PSA of 5.26 in September 2024.  He is followed by Dr. Dwana Curd at [facility] and was last seen on 04/01/2023 regarding concerns for metastatic disease on the MRI scan.  The PSMA PET scan was scheduled at that time but he has not had any further PSA testing since September 2024.  He reports that the left hip pain has become debilitating and limiting his daily activities due to severe pain with any weightbearing.  He does have bilateral hip pain with left greater than right but denies any low back pain or other focal sites of pain associated with osseous metastases.  He has not had any recent changes in his bowel or bladder function and denies any paresthesias or focal weakness in the upper or lower extremities.  We briefly discussed the potential role for palliative radiotherapy to the hips for more durable pain control and he is interested and anxious to proceed.  We also discussed the recommendation for referral to medical oncology to discuss systemic treatments for his metastatic prostate cancer and he is in agreement.  We have  tentatively scheduled him for a follow-up visit at 8:30 AM on 04/10/2023 to review the PSMA PET images and a CT simulation/treatment planning appointment to follow at 10 AM, in anticipation of beginning the daily palliative radiation in the very near future.  We will share our discussion with Dr. Berneice Heinrich and make a referral to Dr. Cherly Hensen, in medical oncology.  He is understandably shaken with these new findings but appears to have a good understanding of our recommendations and is comfortable and in agreement with the stated plan.  Marguarite Arbour, MMS, PA-C [facility] Radiation Oncology Physician Assistant Direct Dial: [phone]  Fax: [phone]

## 2023-04-09 NOTE — Telephone Encounter (Signed)
Per referral message; patient is aware of scheduled appointment times, dates, and location of New Patient appointment. Patient has also been advised to show up at least 20 mins prior for registration.

## 2023-04-10 ENCOUNTER — Ambulatory Visit
Admission: RE | Admit: 2023-04-10 | Discharge: 2023-04-10 | Disposition: A | Discharge status: Home / Self Care | Payer: Medicare Other | Source: Ambulatory Visit | Attending: Urology | Admitting: Urology

## 2023-04-10 ENCOUNTER — Ambulatory Visit
Admission: RE | Admit: 2023-04-10 | Discharge: 2023-04-10 | Disposition: A | Discharge status: Home / Self Care | Payer: Medicare Other | Source: Ambulatory Visit | Attending: Radiation Oncology | Admitting: Radiation Oncology

## 2023-04-10 ENCOUNTER — Encounter: Payer: Self-pay | Admitting: Urology

## 2023-04-10 VITALS — BP 159/98 | HR 110 | Temp 97.5°F | Resp 18 | Ht 72.0 in | Wt 183.0 lb

## 2023-04-10 DIAGNOSIS — Z192 Hormone resistant malignancy status: Secondary | ICD-10-CM | POA: Diagnosis not present

## 2023-04-10 DIAGNOSIS — C61 Malignant neoplasm of prostate: Secondary | ICD-10-CM

## 2023-04-10 DIAGNOSIS — C7951 Secondary malignant neoplasm of bone: Secondary | ICD-10-CM | POA: Diagnosis not present

## 2023-04-10 DIAGNOSIS — Z923 Personal history of irradiation: Secondary | ICD-10-CM | POA: Diagnosis not present

## 2023-04-10 LAB — PROSTATE-SPECIFIC AG, SERUM (LABCORP): Prostate Specific Ag, Serum: 107 ng/mL — ABNORMAL HIGH (ref 0.0–4.0)

## 2023-04-10 NOTE — Progress Notes (Signed)
[facility] CONSULT NOTE  Patient Care Team: Copland, Gwenlyn Found, MD as PCP - General (Family Medicine) Cherlyn Cushing, RN as Oncology Nurse Navigator Manny, Delbert Phenix., MD as Consulting Physician (Urology) Margaretmary Dys, MD as Consulting Physician (Radiation Oncology) Maryclare Labrador, RN as Registered Nurse Axel Filler, Larna Daughters, NP as Nurse Practitioner (Hematology and Oncology)  ASSESSMENT & PLAN:  Johnny Davenport is a 73 y.o.male with history of BPH, arthritis, hyperlipidemia, prostate cancer being [facility] for recurrent prostate cancer.  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  Germline testing: will refer Somatic testing: not yet Treatment: s/p brachytherapy in 08/2022   The patient was counseled on the natural history of prostate cancer and the standard treatment options that are available for prostate cancer.  He appears to have rapid course of progression from his prostate adenocarcinoma now with significant metastases in the bones, lymph nodes as well as lungs.  Previous Bx showed Gleason score 3+4 GG 2 disease which is sort of unusual.  PSA is high but not excessive for the disease burden.  We discussed biopsy to assess for potential transformation.    We discussed triplet therapy in the scenario of high disease burden. Discussed ARASENS trial. An international, phase 3 trial, patients with metastatic, hormone-sensitive prostate cancer in a 1:1 ratio to receive darolutamide 600-mg twice daily) or matching placebo, both in combination with androgen-deprivation therapy and docetaxel chemotherapy.   The overall survival at 4 years was 62.7% in the darolutamide group and 50.4% in the placebo group.  We do not have data without docetaxel in this study. Time to [facility] was over 4 years with triplet therapy.   We discussed  docetaxel chemotherapy.  Potential side effect including fatigue, neutropenia, febrile neutropenia, thrombocytopenia, anemia, alopecia, skin rash, loss of nail and potentially infection, hypersensitivity, fluid retention with swelling, diarrhea, nausea, vomiting, mouth sores, liver enzyme elevation, neuropathy, muscle pain, joint pain, visual change and rarely severe allergic reaction, skin rash, severe infection resulting in sepsis, and potential death.  Discussed the importance of recognizing fever, signs of infection while on treatment. Proceed to ED for emergency evaluation in the setting of fever, infection. Severe infection resulting in sepsis can be life threatening and result in death if no treated early. 4% of deaths were reported in both group.  ARPI can cause fatigue, and rarely rash, heart failure, ischemic heart disease in <5%.  In patients undergoing ADT with prostate cancer, aggressive management of cardiovascular risk factors to decrease cardiac events recommended including controlling hypertension, hyperlipidemia, prevent diabetes, strokes, heart attacks and regular exercises are recommended.   Given his need for palliative radiation recommend use this time to obtain biopsy as well, genetic testing.  After completion of palliative radiation, we will plan on chemotherapy in addition to ADT with darolutamide.  He will obtain samples from [facility] to start ADT and darolutamide over there.  He will also obtain second opinion at [facility] or [facility] at his choice.  I called and communicated with IR on biopsy.  Malignant neoplasm of prostate (HCC) Biopsy to confirm pathology rule out transformation Referral for genetic testing Will obtain somatic testing as well. Start darolutamide with ADT with [facility] Return after completion of chemotherapy radiation for chemotherapy  At risk for side effect of medication Supportive baseline bone mineral density study and then every 2  years  calcium (1000-1200 mg daily from food and supplements) and vitamin D3 (1000 IU daily) Zometa on ADT after dental clearance. Control and prevent diabetes Aggressive cardiovascular risk management Weight-bearing exercises (30 minutes per day) Limit alcohol consumption and avoid smoking   Orders Placed This Encounter  Procedures   CT BIOPSY    Standing Status:   Future    Expected Date:   04/15/2023    Expiration Date:   04/10/2024    Lab orders requested (DO NOT place separate lab orders, these will be automatically ordered during procedure specimen collection)::   Surgical Pathology    Reason for Exam (SYMPTOM  OR DIAGNOSIS REQUIRED):   RIGHT posterior iliac biopsy for diagnosis rapid growing metastases. r/o transformation of prostate cancer vs other    Preferred location?:   [facility]   Ambulatory referral to Genetics    Referral Priority:   Routine    Referral Type:   Consultation    Referral Reason:   Specialty Services Required    Number of Visits Requested:   1    The total time spent in the appointment was 105 minutes encounter with patients including review of chart and various tests results, discussions about plan of care and coordination of care plan  All questions were answered. The patient knows to call the clinic with any problems, questions or concerns. No barriers to learning was detected.  Melven Sartorius, MD 1/31/20257:19 PM  CHIEF COMPLAINTS/PURPOSE OF CONSULTATION:  Prostate cancer  HISTORY OF PRESENTING ILLNESS:  Johnny Davenport 73 y.o. male is here because of prostate cancer. He has a history of T2 adenocarcinoma of the prostate with Gleason score 3+4 GG2 with a pretreatment PSA of 7.3.  He was treated with brachytherapy in June 2024 with a posttreatment PSA of 5.26 in September 2024. A MRI scan of the left hip on 03/27/23 for evaluation of persistent left hip pain show osseous metastases.  This was followed by a PSMA PET scan on 04/08/2023 showed  extensive tracer avid skeletal metastases involving the axillary and appendicular skeleton, extensive tracer avid lymph node metastases in the pelvis, retroperitoneum and mediastinum and multiple tracer avid pulmonary metastases. He has PSA on 04/09/23 and that showed elevated PSA at 107.    He was evaluated by radiation oncology and reports that the left hip pain has become debilitating and limiting his daily activities due to severe pain with any weightbearing.  He does have bilateral hip pain with left greater than right but denies any low back pain or other focal sites of pain associated with the osseous metastases.  He has not had any recent changes in his bowel or bladder function and denies any paresthesias or focal weakness in the upper or lower extremities.  He has plan to start palliative radiation.  I have reviewed his chart and materials related to his cancer extensively and collaborated history with the patient. Summary of oncologic history is as follows: Oncology History  Malignant neoplasm of prostate (HCC)  05/06/2022 Cancer Staging   Staging form: Prostate, AJCC 8th Edition - Clinical stage from 05/06/2022: Stage IIB (cT2, cN0, cM0, PSA: 7.3, Grade Group: 2) - Signed by Marcello Fennel, PA-C on 07/04/2022 Histopathologic type: Adenocarcinoma, NOS Stage prefix: Initial diagnosis Prostate specific antigen (PSA) range: Less than 10 Gleason primary pattern: 3 Gleason secondary pattern: 4 Gleason score: 7 Histologic grading system: 5 grade system Number of biopsy cores examined: 12 Number of biopsy cores positive: 4 Location of positive needle core biopsies: One  side   05/28/2022 Initial Diagnosis   Malignant neoplasm of prostate   03/27/2023 Imaging   MRI L Hip Heterogeneous osseous metastasis involving the pelvis involving the L4 and L5 vertebral bodies Probable extraosseous extension and small pathologic fracture of left ischial tuberosity with cortical disruption laterally.   Moderate sized partial-thickness tear of the adjacent left hamstring tendon origin Bone marrow edema surrounding the left SI joint with mild edema type signal along the superior margin of the left piriformis muscle.  Finding could be secondary to extraosseous extension of metastasis or represent posttraumatic change Small partial-thickness tear in the left gluteus muscle minimus tendon at the insertion on the greater trochanter with associated reactive bone marrow edema in the superior greater trochanter. Small low signal intensity focus along the insertional fibers of the left gluteus tendons which could represent calcium hydroxyapatite deposit Mild osteoarthritis of both. Possible enlarged left internal iliac/perirectal lymph node measuring 1.8 cm   04/08/2023 PET scan   PSMA PET 1. Extensive radiotracer avid skeletal metastasis involving the axillary and appendicular skeleton. 2. Extensive radiotracer avid lymph node metastasis in the pelvis, retroperitoneum, and mediastinum. 3. Multiple radiotracer avid small pulmonary metastasis.   04/10/2023 Tumor Marker   PSA 107   Prostate cancer metastatic to multiple sites [facility])  04/09/2023 Initial Diagnosis   Prostate cancer metastatic to multiple sites [facility])   04/10/2023 Cancer Staging   Staging form: Prostate, AJCC 8th Edition - Clinical stage from 04/10/2023: Stage IVB (rcT0, cN1, cM1c, PSA: 107, Grade Group: 2) - Signed by Margaretmary Dys, MD on 04/11/2023 Stage prefix: Recurrence Prostate specific antigen (PSA) range: 20 or greater Gleason score: 7 Histologic grading system: 5 grade system     MEDICAL HISTORY:  Past Medical History:  Diagnosis Date   Allergy    seasonal allergies   Arthritis    bilateral thumbs   BPH (benign prostatic hyperplasia)    on meds   Elevated PSA    GERD (gastroesophageal reflux disease)    with certain foods/OTC meds for tx   Hyperlipidemia    on meds   Incomplete bladder emptying 12/28/2019    11/22/2019   migraine    Prostate cancer (HCC)    Wears glasses     SURGICAL HISTORY: Past Surgical History:  Procedure Laterality Date   COLONOSCOPY  2021   TA/hems-5 yr recall   Hand/Finger surgery  1997   KNEE ARTHROSCOPY W/ MENISCAL REPAIR Right    x 2   POLYPECTOMY  2016   TA   PROSTATE BIOPSY     RADIOACTIVE SEED IMPLANT N/A 08/13/2022   Procedure: RADIOACTIVE SEED IMPLANT/BRACHYTHERAPY IMPLANT;  Surgeon: Belva Agee, MD;  Location: [facility];  Service: Urology;  Laterality: N/A;  90 MINS   ROTATOR CUFF REPAIR Left    SPACE OAR INSTILLATION N/A 08/13/2022   Procedure: SPACE OAR INSTILLATION;  Surgeon: Belva Agee, MD;  Location: [facility];  Service: Urology;  Laterality: N/A;   TONSILLECTOMY     WISDOM TOOTH EXTRACTION      SOCIAL HISTORY: Social History   Socioeconomic History   Marital status: Married    Spouse name: Not on file   Number of children: Not on file   Years of education: Not on file   Highest education level: Master's degree (e.g., MA, MS, MEng, MEd, MSW, MBA)  Occupational History   Not on file  Tobacco Use   Smoking status: Never   Smokeless tobacco: Never  Vaping Use  Vaping status: Never Used  Substance and Sexual Activity   Alcohol use: Not Currently    Alcohol/week: 1.0 standard drink of alcohol    Types: 1 Standard drinks or equivalent per week   Drug use: No   Sexual activity: Yes    Birth control/protection: None  Other Topics Concern   Not on file  Social History Narrative   Not on file   Social Drivers of Health   Financial Resource Strain: Low Risk  (11/26/2022)   Overall Financial Resource Strain (CARDIA)    Difficulty of Paying Living Expenses: Not hard at all  Food Insecurity: No Food Insecurity (04/10/2023)   Hunger Vital Sign    Worried About Running Out of Food in the Last Year: Never true    Ran Out of Food in the Last Year: Never true  Transportation Needs: No Transportation  Needs (04/10/2023)   PRAPARE - Administrator, Civil Service (Medical): No    Lack of Transportation (Non-Medical): No  Physical Activity: Sufficiently Active (11/26/2022)   Exercise Vital Sign    Days of Exercise per Week: 5 days    Minutes of Exercise per Session: 30 min  Recent Concern: Physical Activity - Insufficiently Active (09/22/2022)   Exercise Vital Sign    Days of Exercise per Week: 3 days    Minutes of Exercise per Session: 40 min  Stress: No Stress Concern Present (11/26/2022)   [employer] of Occupational Health - Occupational Stress Questionnaire    Feeling of Stress : Not at all  Social Connections: Socially Integrated (11/26/2022)   Social Connection and Isolation Panel [NHANES]    Frequency of Communication with Friends and Family: More than three times a week    Frequency of Social Gatherings with Friends and Family: Three times a week    Attends Religious Services: 1 to 4 times per year    Active Member of Clubs or Organizations: Yes    Attends Banker Meetings: 1 to 4 times per year    Marital Status: Married  Catering manager Violence: Not At Risk (04/10/2023)   Humiliation, Afraid, Rape, and Kick questionnaire    Fear of Current or Ex-Partner: No    Emotionally Abused: No    Physically Abused: No    Sexually Abused: No    FAMILY HISTORY: Family History  Problem Relation Age of Onset   Stroke Mother 93   Healthy Sister    Healthy Sister    Colon cancer Neg Hx    Colon polyps Neg Hx    Esophageal cancer Neg Hx    Stomach cancer Neg Hx    Rectal cancer Neg Hx     ALLERGIES:  is allergic to other.  MEDICATIONS:  Current Outpatient Medications  Medication Sig Dispense Refill   omeprazole (PRILOSEC) 20 MG capsule Take 1 capsule (20 mg total) by mouth daily. 90 capsule 3   rosuvastatin (CRESTOR) 20 MG tablet Take 1 tablet (20 mg total) by mouth daily. 90 tablet 3   silodosin (RAPAFLO) 8 MG CAPS capsule Take 8 mg by mouth  daily.     SUMAtriptan (IMITREX) 50 MG tablet Take 1 tablet (50 mg total) by mouth every 2 (two) hours as needed for migraine. Max 100 mg in 24 hours 10 tablet 6   No current facility-administered medications for this visit.    REVIEW OF SYSTEMS:   All relevant systems were reviewed with the patient and are negative.  PHYSICAL EXAMINATION: ECOG PERFORMANCE STATUS: 1 -  Symptomatic but completely ambulatory  Vitals:   04/11/23 1030 04/11/23 1031  BP: (!) 160/78 (!) 148/81  Pulse: 97   Resp: 18   Temp: 97.7 F (36.5 C)   SpO2: 96%    Filed Weights   04/11/23 1030  Weight: 183 lb 3.2 oz (83.1 kg)    GENERAL: alert, no distress and comfortable SKIN: no jaundice  LABORATORY DATA:  I have reviewed the data as listed Lab Results  Component Value Date   WBC 5.3 10/21/2022   HGB 13.9 10/21/2022   HCT 42.6 10/21/2022   MCV 97.5 10/21/2022   PLT 189.0 10/21/2022   Recent Labs    04/21/22 2346 05/13/22 0907 10/21/22 0838  NA 140 141 137  K 4.5 4.3 4.4  CL 108 103 101  CO2 23 31 31   GLUCOSE 132* 87 84  BUN 24* 16 21  CREATININE 1.16 1.02 1.01  CALCIUM 8.4* 9.4 9.2  GFRNONAA >60  --   --   PROT 6.1*  --  6.3  ALBUMIN 3.7  --  4.3  AST 33  --  24  ALT 33  --  22  ALKPHOS 50  --  80  BILITOT 0.6  --  0.7    RADIOGRAPHIC STUDIES: I have personally reviewed the radiological images as listed and agreed with the findings in the report. NM PET (PSMA) SKULL TO MID THIGH Result Date: 04/08/2023 CLINICAL DATA:  Prostate carcinoma with biochemical recurrence. Brachytherapy June 2024. EXAM: NUCLEAR MEDICINE PET SKULL BASE TO THIGH TECHNIQUE: mCi F18 Piflufolastat (Pylarify) was injected intravenously. Full-ring PET imaging was performed from the skull base to thigh after the radiotracer. CT data was obtained and used for attenuation correction and anatomic localization. COMPARISON:  None Available. FINDINGS: NECK No radiotracer activity in neck lymph nodes. Incidental CT finding:  None. CHEST Multiple mediastinal and hilar radiotracer avid nodes. Example cluster of nodes in the LEFT hilum with SUV max equal 16.4. RIGHT lower paratracheal node measures 12 mm with SUV max equal 20 on image 59. Small pulmonary nodules in about the hilar structures with intense radiotracer activity. Example RIGHT upper lobe nodule measuring 6 mm (image 63/6) with SUV max 9. Example LEFT lobe nodule measuring 9 mm on image 64 SUV max equal 5.2. Approximately 10 radiotracer avid metastatic pulmonary nodules. Incidental CT finding: None. ABDOMEN/PELVIS Prostate: Brachytherapy seeds within the prostate gland. Clear abnormal radiotracer activity. Lymph nodes: Within the LEFT mesorectal sheath irregular soft tissue nodule measuring 3 cm (image 154) has intense metabolic activity with SUV max equal 16.5. Similar smaller node in the RIGHT mesorectum on image 154. Conglomerate of lymph nodes in the presacral space measures 33 mm on image 142 with SUV max equal 17.3. Scattered small radiotracer avid nodules along the periaortic retroperitoneum Liver: No evidence of liver metastasis. Incidental CT finding: None. SKELETON There is extensive intense radiotracer avid skeletal metastasis. Entirety of the posterior LEFT and RIGHT iliac bone and sacrum are involved. Example in the LEFT sacrum / iliac bone with SUV max equal 23 on image 138. There is only there is mild subtle haziness within the bone this level. Multiple vertebral bodies are involved with radiotracer avid metastatic disease. For example L1 vertebral body with SUV max equal 24. Hypermetabolic metastasis extend in the shoulder girdles, proximal LEFT femur, and the RIGHT skull base. IMPRESSION: 1. Extensive radiotracer avid skeletal metastasis involving the axillary and appendicular skeleton. 2. Extensive radiotracer avid lymph node metastasis in the pelvis, retroperitoneum, and mediastinum. 3. Multiple  radiotracer avid small pulmonary metastasis. Electronically  Signed   By: Genevive Bi M.D.   On: 04/08/2023 16:40

## 2023-04-10 NOTE — Progress Notes (Signed)
Patient will see Dr. Cherly Hensen on 1/31 at 10:30am, patient aware.    RN provided book on healthy eating and placed referral for nutrition consult.

## 2023-04-10 NOTE — Progress Notes (Addendum)
In-person nursing appointment for review of most recent PET scan results. I verified patient's identity x2 and began nursing interview.   Patient reports doing well. Patient denies any related issues at this time.   Meaningful use complete.   Patient aware of their 8:30am-04/10/23 in-person appointment w/ Ashlyn Bruning PA-C. I left my extension [phone] in case patient needs anything. Patient verbalized understanding. This concludes the nursing interview.   Patient contact [phone]  BP (!) 159/98 (BP Location: Left Arm, Patient Position: Sitting, Cuff Size: Normal)   Pulse (!) 110   Temp (!) 97.5 F (36.4 C) (Temporal)   Resp 18   Ht 6' (1.829 m)   Wt 183 lb (83 kg)   SpO2 97%   BMI 24.82 kg/m       Ruel Favors, LPN

## 2023-04-10 NOTE — Progress Notes (Deleted)
  Radiation Oncology         [phone] ________________________________  Name: Johnny Davenport MRN: [id number]  Date: 04/10/2023  DOB: 10-Nov-1950  SIMULATION AND TREATMENT PLANNING NOTE    ICD-10-CM   1. Prostate cancer metastatic to multiple sites [facility])  C61       DIAGNOSIS:  73 y.o. 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  PSA Nadir of 0.52 in Aug 2024, PSA rise to 107 yesterday.  NARRATIVE:  The patient was brought to the CT Simulation planning suite.  Identity was confirmed.  All relevant records and images related to the planned course of therapy were reviewed.  The patient freely provided informed written consent to proceed with treatment after reviewing the details related to the planned course of therapy. The consent form was witnessed and verified by the simulation staff.  Then, the patient was set-up in a stable reproducible  supine position for radiation therapy.  CT images were obtained.  Surface markings were placed.  The CT images were loaded into the planning software.  Then the target and avoidance structures were contoured.  Treatment planning then occurred.  The radiation prescription was entered and confirmed.  Then, I designed and supervised the construction of a total of at least 3 medically necessary complex treatment devices consisting of leg positioner and MLC apertures to cover the treated hip area.  I have requested : 3D Simulation  I have requested a DVH of the following structures: Rectum, Bladder, femoral heads and target.  PLAN:  The patient will receive 30 Gy in 10 fractions to bilateral acetabula.  ________________________________  Artist Pais. Kathrynn Running, M.D.

## 2023-04-10 NOTE — Progress Notes (Signed)
  Radiation Oncology         [phone] ________________________________  Name: Melo Stauber MRN: [id number]  Date: 04/10/2023  DOB: 10-Nov-1950  SIMULATION AND TREATMENT PLANNING NOTE    ICD-10-CM   1. Prostate cancer metastatic to multiple sites [facility])  C61       DIAGNOSIS:  73 y.o. 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  PSA Nadir of 0.52 in Aug 2024, PSA rise to 107 yesterday.  NARRATIVE:  The patient was brought to the CT Simulation planning suite.  Identity was confirmed.  All relevant records and images related to the planned course of therapy were reviewed.  The patient freely provided informed written consent to proceed with treatment after reviewing the details related to the planned course of therapy. The consent form was witnessed and verified by the simulation staff.  Then, the patient was set-up in a stable reproducible  supine position for radiation therapy.  CT images were obtained.  Surface markings were placed.  The CT images were loaded into the planning software.  Then the target and avoidance structures were contoured.  Treatment planning then occurred.  The radiation prescription was entered and confirmed.  Then, I designed and supervised the construction of a total of at least 3 medically necessary complex treatment devices consisting of leg positioner and MLC apertures to cover the treated hip area.  I have requested : 3D Simulation  I have requested a DVH of the following structures: Rectum, Bladder, femoral heads and target.  PLAN:  The patient will receive 30 Gy in 10 fractions to bilateral acetabula.  ________________________________  Artist Pais. Kathrynn Running, M.D.

## 2023-04-11 ENCOUNTER — Inpatient Hospital Stay: Payer: Medicare Other

## 2023-04-11 VITALS — BP 148/81 | HR 97 | Temp 97.7°F | Resp 18 | Wt 183.2 lb

## 2023-04-11 DIAGNOSIS — C778 Secondary and unspecified malignant neoplasm of lymph nodes of multiple regions: Secondary | ICD-10-CM | POA: Insufficient documentation

## 2023-04-11 DIAGNOSIS — C61 Malignant neoplasm of prostate: Secondary | ICD-10-CM | POA: Insufficient documentation

## 2023-04-11 DIAGNOSIS — C7800 Secondary malignant neoplasm of unspecified lung: Secondary | ICD-10-CM | POA: Insufficient documentation

## 2023-04-11 DIAGNOSIS — C7951 Secondary malignant neoplasm of bone: Secondary | ICD-10-CM | POA: Insufficient documentation

## 2023-04-11 DIAGNOSIS — M899 Disorder of bone, unspecified: Secondary | ICD-10-CM

## 2023-04-11 DIAGNOSIS — Z9189 Other specified personal risk factors, not elsewhere classified: Secondary | ICD-10-CM | POA: Insufficient documentation

## 2023-04-11 NOTE — Patient Instructions (Addendum)
Dear Johnny Davenport  We discussed triplet therapy in the scenario of high disease burden. Discussed ARASENS trial. An international, phase 3 trial, patients with metastatic, hormone-sensitive prostate cancer in a 1:1 ratio to receive darolutamide 600-mg twice daily) or matching placebo, both in combination with androgen-deprivation therapy and docetaxel chemotherapy.   The overall survival at 4 years was 62.7% in the darolutamide group and 50.4% in the placebo group.  We do not have data without docetaxel in this study. Time to [facility] was over 4 years with triplet therapy.   We discussed docetaxel chemotherapy.  Potential side effect including fatigue, neutropenia, febrile neutropenia, thrombocytopenia, anemia, alopecia, skin rash, loss of nail and potentially infection, hypersensitivity, fluid retention with swelling, diarrhea, nausea, vomiting, mouth sores, liver enzyme elevation, neuropathy, muscle pain, joint pain, visual change and rarely severe allergic reaction, skin rash, severe infection resulting in sepsis, and potential death.  Discussed the importance of recognizing fever, signs of infection while on treatment. Proceed to ED for emergency evaluation in the setting of fever, infection. Severe infection resulting in sepsis can be life threatening and result in death if no treated early. 4% of deaths were reported in both group.  ARPI can cause fatigue, and rarely rash, heart failure, ischemic heart disease in <5%.  In patients undergoing ADT with prostate cancer, aggressive management of cardiovascular risk factors to decrease cardiac events recommended including controlling hypertension, hyperlipidemia, prevent diabetes, strokes, heart attacks and regular exercises are recommended.   Given the need for palliative radiation recommend use this time to obtain biopsy as well, genetic testing.  After completion of palliative radiation, we will plan on chemotherapy in addition to ADT with darolutamide.  You can  obtain samples from [facility] to start ADT and darolutamide over there starting today.   You need to take calcium (1000-1200 mg daily from food and supplements) and vitamin D3 (1000 IU daily) for annual bone health.  Please see dentist for any potential need for invasive dental procedure and completed soon as you will be recommended to start bone strengthening medication (as same osteoporosis medication) like Zometa to delayed fractures from bone metastases.  Continue with healthy diet, control high blood pressure, decrease cardiovascular risk factors prevent diabetes and exercises regularly.

## 2023-04-11 NOTE — Assessment & Plan Note (Signed)
Biopsy to confirm pathology rule out transformation Referral for genetic testing Will obtain somatic testing as well. Start darolutamide with ADT with [facility] Return after completion of chemotherapy radiation for chemotherapy

## 2023-04-11 NOTE — Assessment & Plan Note (Signed)
Supportive baseline bone mineral density study and then every 2 years calcium (1000-1200 mg daily from food and supplements) and vitamin D3 (1000 IU daily) Zometa on ADT after dental clearance. Control and prevent diabetes Aggressive cardiovascular risk management Weight-bearing exercises (30 minutes per day) Limit alcohol consumption and avoid smoking

## 2023-04-11 NOTE — Progress Notes (Signed)
   Per secure chat: Dr. Fredia Sorrow approved for CT Guided iliac bone biopsy w/ sedation , any IR .

## 2023-04-11 NOTE — Progress Notes (Signed)
START ON PATHWAY REGIMEN - Prostate     Darolutamide: A cycle is every 28 days:     Darolutamide    Docetaxel cycles 1 through 6: A cycle is every 21 days:     Docetaxel   **Always confirm dose/schedule in your pharmacy ordering system**  Patient Characteristics: Adenocarcinoma, Recurrent/New Systemic Disease (Including Biochemical Recurrence), Non-Castrate, M1 Histology: Adenocarcinoma Therapeutic Status: Recurrent/New Systemic Disease (Including Biochemical Recurrence) Intent of Therapy: Non-Curative / Palliative Intent, Discussed with Patient 

## 2023-04-12 ENCOUNTER — Other Ambulatory Visit: Payer: Self-pay

## 2023-04-14 ENCOUNTER — Other Ambulatory Visit: Payer: Self-pay

## 2023-04-14 ENCOUNTER — Ambulatory Visit
Admission: RE | Admit: 2023-04-14 | Discharge: 2023-04-14 | Disposition: A | Discharge status: Home / Self Care | Payer: Medicare Other | Source: Ambulatory Visit | Attending: Radiation Oncology | Admitting: Radiation Oncology

## 2023-04-14 ENCOUNTER — Ambulatory Visit: Payer: Medicare Other

## 2023-04-14 DIAGNOSIS — Z923 Personal history of irradiation: Secondary | ICD-10-CM | POA: Diagnosis not present

## 2023-04-14 DIAGNOSIS — C7951 Secondary malignant neoplasm of bone: Secondary | ICD-10-CM | POA: Diagnosis not present

## 2023-04-14 DIAGNOSIS — Z192 Hormone resistant malignancy status: Secondary | ICD-10-CM | POA: Diagnosis not present

## 2023-04-14 DIAGNOSIS — C61 Malignant neoplasm of prostate: Secondary | ICD-10-CM | POA: Diagnosis present

## 2023-04-14 LAB — RAD ONC ARIA SESSION SUMMARY
Course Elapsed Days: 0
Plan Fractions Treated to Date: 1
Plan Prescribed Dose Per Fraction: 3 Gy
Plan Total Fractions Prescribed: 10
Plan Total Prescribed Dose: 30 Gy
Reference Point Dosage Given to Date: 3 Gy
Reference Point Session Dosage Given: 3 Gy
Session Number: 1

## 2023-04-15 ENCOUNTER — Telehealth: Payer: Self-pay

## 2023-04-15 ENCOUNTER — Telehealth: Payer: Self-pay | Admitting: Genetic Counselor

## 2023-04-15 ENCOUNTER — Other Ambulatory Visit: Payer: Self-pay

## 2023-04-15 ENCOUNTER — Ambulatory Visit
Admission: RE | Admit: 2023-04-15 | Discharge: 2023-04-15 | Disposition: A | Discharge status: Home / Self Care | Payer: Medicare Other | Source: Ambulatory Visit | Attending: Radiation Oncology | Admitting: Radiation Oncology

## 2023-04-15 ENCOUNTER — Other Ambulatory Visit (HOSPITAL_COMMUNITY): Payer: Self-pay

## 2023-04-15 ENCOUNTER — Telehealth: Payer: Self-pay | Admitting: Pharmacy Technician

## 2023-04-15 DIAGNOSIS — C61 Malignant neoplasm of prostate: Secondary | ICD-10-CM | POA: Diagnosis not present

## 2023-04-15 DIAGNOSIS — Z510 Encounter for antineoplastic radiation therapy: Secondary | ICD-10-CM | POA: Diagnosis not present

## 2023-04-15 DIAGNOSIS — Z923 Personal history of irradiation: Secondary | ICD-10-CM | POA: Diagnosis not present

## 2023-04-15 DIAGNOSIS — Z192 Hormone resistant malignancy status: Secondary | ICD-10-CM | POA: Diagnosis not present

## 2023-04-15 DIAGNOSIS — C7951 Secondary malignant neoplasm of bone: Secondary | ICD-10-CM | POA: Diagnosis not present

## 2023-04-15 LAB — RAD ONC ARIA SESSION SUMMARY
Course Elapsed Days: 1
Plan Fractions Treated to Date: 2
Plan Prescribed Dose Per Fraction: 3 Gy
Plan Total Fractions Prescribed: 10
Plan Total Prescribed Dose: 30 Gy
Reference Point Dosage Given to Date: 6 Gy
Reference Point Session Dosage Given: 3 Gy
Session Number: 2

## 2023-04-15 MED ORDER — NUBEQA 300 MG PO TABS
600.0000 mg | ORAL_TABLET | Freq: Two times a day (BID) | ORAL | 11 refills | Status: DC
  Filled 2023-05-02: qty 120, 30d supply, fill #0
  Filled 2023-05-20: qty 120, 30d supply, fill #1
  Filled 2023-06-26 (×2): qty 120, 30d supply, fill #2
  Filled 2023-07-24: qty 120, 30d supply, fill #3
  Filled 2023-08-25: qty 120, 30d supply, fill #4
  Filled 2023-09-16: qty 120, 30d supply, fill #5
  Filled 2023-10-16: qty 120, 30d supply, fill #6
  Filled 2023-11-08 – 2023-11-11 (×2): qty 120, 30d supply, fill #7
  Filled 2023-12-15: qty 120, 30d supply, fill #8
  Filled 2024-01-14: qty 120, 30d supply, fill #9
  Filled 2024-02-19: qty 120, 30d supply, fill #10
  Filled 2024-03-22 (×2): qty 120, 30d supply, fill #11

## 2023-04-15 MED ORDER — RELUGOLIX 120 MG PO TABS
120.0000 mg | ORAL_TABLET | Freq: Every day | ORAL | 11 refills | Status: DC
  Filled 2023-05-02: qty 30, 30d supply, fill #0
  Filled 2023-05-20: qty 30, 30d supply, fill #1
  Filled 2023-06-26 (×2): qty 30, 30d supply, fill #2
  Filled 2023-07-24: qty 30, 30d supply, fill #3
  Filled 2023-08-25 – 2023-08-27 (×2): qty 30, 30d supply, fill #4
  Filled 2023-09-16: qty 30, 30d supply, fill #5
  Filled 2023-10-16: qty 30, 30d supply, fill #6
  Filled 2023-11-08 – 2023-11-11 (×2): qty 30, 30d supply, fill #7
  Filled 2023-12-15: qty 30, 30d supply, fill #8
  Filled 2024-01-14: qty 30, 30d supply, fill #9
  Filled 2024-02-19: qty 30, 30d supply, fill #10
  Filled 2024-03-19 – 2024-03-22 (×3): qty 30, 30d supply, fill #11

## 2023-04-15 NOTE — Telephone Encounter (Signed)
 Oral Oncology Patient Advocate Encounter  Patient is transferring medication management for Orgovyx  and Nubeqa  to [facility] from [facility].  Patient received 30 days worth of medication from [facility] for both therapies. I called and spoke with the patient to discuss patient assistance programs and confirm if [facility] had begun that process for either medication. Patient stated this had been discussed, but nothing was signed. Patient further stated that they would be willing to pay their OOP max for the medicare plan for the year rather than applying for assistance through the manufacturer programs.  The current patient responsibility remaining on their 2025 OOP Max is $1,963.90  Johnny Davenport, CPhT-Adv Oncology Pharmacy Patient Advocate [facility] Direct Number: [phone]  Fax: [phone]

## 2023-04-15 NOTE — Telephone Encounter (Signed)
 Patient Scheduled appts. Patient is aware of all appt details.

## 2023-04-16 ENCOUNTER — Ambulatory Visit
Admission: RE | Admit: 2023-04-16 | Discharge: 2023-04-16 | Disposition: A | Discharge status: Home / Self Care | Payer: Medicare Other | Source: Ambulatory Visit | Attending: Radiation Oncology | Admitting: Radiation Oncology

## 2023-04-16 ENCOUNTER — Other Ambulatory Visit: Payer: Self-pay

## 2023-04-16 DIAGNOSIS — C61 Malignant neoplasm of prostate: Secondary | ICD-10-CM | POA: Diagnosis not present

## 2023-04-16 DIAGNOSIS — Z923 Personal history of irradiation: Secondary | ICD-10-CM | POA: Diagnosis not present

## 2023-04-16 DIAGNOSIS — Z192 Hormone resistant malignancy status: Secondary | ICD-10-CM | POA: Diagnosis not present

## 2023-04-16 DIAGNOSIS — C7951 Secondary malignant neoplasm of bone: Secondary | ICD-10-CM | POA: Diagnosis not present

## 2023-04-16 DIAGNOSIS — Z510 Encounter for antineoplastic radiation therapy: Secondary | ICD-10-CM | POA: Diagnosis not present

## 2023-04-16 LAB — RAD ONC ARIA SESSION SUMMARY
Course Elapsed Days: 2
Plan Fractions Treated to Date: 3
Plan Prescribed Dose Per Fraction: 3 Gy
Plan Total Fractions Prescribed: 10
Plan Total Prescribed Dose: 30 Gy
Reference Point Dosage Given to Date: 9 Gy
Reference Point Session Dosage Given: 3 Gy
Session Number: 3

## 2023-04-17 ENCOUNTER — Other Ambulatory Visit: Payer: Self-pay

## 2023-04-17 ENCOUNTER — Ambulatory Visit
Admission: RE | Admit: 2023-04-17 | Discharge: 2023-04-17 | Disposition: A | Discharge status: Home / Self Care | Payer: Medicare Other | Source: Ambulatory Visit | Attending: Radiation Oncology | Admitting: Radiation Oncology

## 2023-04-17 DIAGNOSIS — Z923 Personal history of irradiation: Secondary | ICD-10-CM | POA: Diagnosis not present

## 2023-04-17 DIAGNOSIS — C7951 Secondary malignant neoplasm of bone: Secondary | ICD-10-CM | POA: Diagnosis not present

## 2023-04-17 DIAGNOSIS — C61 Malignant neoplasm of prostate: Secondary | ICD-10-CM | POA: Diagnosis not present

## 2023-04-17 DIAGNOSIS — Z192 Hormone resistant malignancy status: Secondary | ICD-10-CM | POA: Diagnosis not present

## 2023-04-17 DIAGNOSIS — Z510 Encounter for antineoplastic radiation therapy: Secondary | ICD-10-CM | POA: Diagnosis not present

## 2023-04-17 LAB — RAD ONC ARIA SESSION SUMMARY
Course Elapsed Days: 3
Plan Fractions Treated to Date: 4
Plan Prescribed Dose Per Fraction: 3 Gy
Plan Total Fractions Prescribed: 10
Plan Total Prescribed Dose: 30 Gy
Reference Point Dosage Given to Date: 12 Gy
Reference Point Session Dosage Given: 3 Gy
Session Number: 4

## 2023-04-18 ENCOUNTER — Encounter: Payer: Self-pay | Admitting: Family Medicine

## 2023-04-18 ENCOUNTER — Ambulatory Visit
Admission: RE | Admit: 2023-04-18 | Discharge: 2023-04-18 | Disposition: A | Discharge status: Home / Self Care | Payer: Medicare Other | Source: Ambulatory Visit | Attending: Radiation Oncology

## 2023-04-18 ENCOUNTER — Other Ambulatory Visit: Payer: Self-pay

## 2023-04-18 DIAGNOSIS — Z510 Encounter for antineoplastic radiation therapy: Secondary | ICD-10-CM | POA: Diagnosis not present

## 2023-04-18 DIAGNOSIS — C7951 Secondary malignant neoplasm of bone: Secondary | ICD-10-CM | POA: Diagnosis not present

## 2023-04-18 DIAGNOSIS — Z923 Personal history of irradiation: Secondary | ICD-10-CM | POA: Diagnosis not present

## 2023-04-18 DIAGNOSIS — Z192 Hormone resistant malignancy status: Secondary | ICD-10-CM | POA: Diagnosis not present

## 2023-04-18 DIAGNOSIS — C61 Malignant neoplasm of prostate: Secondary | ICD-10-CM | POA: Diagnosis not present

## 2023-04-18 LAB — RAD ONC ARIA SESSION SUMMARY
Course Elapsed Days: 4
Plan Fractions Treated to Date: 5
Plan Prescribed Dose Per Fraction: 3 Gy
Plan Total Fractions Prescribed: 10
Plan Total Prescribed Dose: 30 Gy
Reference Point Dosage Given to Date: 15 Gy
Reference Point Session Dosage Given: 3 Gy
Session Number: 5

## 2023-04-18 NOTE — Telephone Encounter (Addendum)
 Oral Oncology Pharmacist Encounter  Addendum: I spoke with patient and he understood to discuss with his PCP about changing the crestor  medication. Patient did not have any questions or concerns about the medications.   Patient voiced understanding and appreciation. All questions answered. Medication handout provided. Provided patient with Oral Chemotherapy Navigation Clinic phone number. Patient knows to call the office with questions or concerns.   Counseled patient on administration, dosing, side effects, monitoring, drug-food interactions, safe handling, storage, and disposal. Patient will take Nubeqa  300 mg tablet, 2 tablets (600 mg total) by mouth 2 (two) times daily with a meal. And patient knows to continue the Orgovyx  at 1 tablet (120mg ) by mouth daily. Patient knows to avoid grapefruit and grapefruit juice while on Nubeqa .   Side effects include but not limited to: changes in LFTs, HTN, fatigue.     Reviewed with patient importance of keeping a medication schedule and plan for any missed doses. After discussion with patient no patient barriers to medication adherence identified.   Oral Oncology Pharmacist Encounter Patient received prescription of Nubeqa  and Orgovyx  at [facility]. Patient is going to start receiving this medication from us  in the future for the treatment of prostate cancer in conjunction with docetaxel , planned duration until disease progression or unacceptable toxicity.   Labs were not completed here as patient started at the urology office. Patient will get follow up labs here.   Current medication list in Epic reviewed, DDIs with Nubeqa  identified: - Crestor : nubeqa  can increase the concentration of Crestor  and increase risk of toxicities. Recommendation is to change the statin. Attempted to call patient to have them call their PCP to see if they can change from Crestor  to atorvastatin or simvastatin . Had to leave a voicemail as patient did not answer. Will try  to call them again Monday if they do not call back.   Evaluated chart and no patient barriers to medication adherence noted. Patient agreement for treatment documented in MD note on 04/11/2023. Next months prescription has been e-scribed to the [facility] for benefits analysis and approval.  Patient voiced understanding and appreciation. All questions answered. Medication handout provided. Provided patient with Oral Chemotherapy Navigation Clinic phone number. Patient knows to call the office with questions or concerns.  Tayah Idrovo, PharmD Hematology/Oncology Clinical Pharmacist [facility] Oral Chemotherapy Navigation Clinic [phone] 04/18/2023 11:21 AM

## 2023-04-21 ENCOUNTER — Other Ambulatory Visit: Payer: Self-pay

## 2023-04-21 ENCOUNTER — Ambulatory Visit
Admission: RE | Admit: 2023-04-21 | Discharge: 2023-04-21 | Disposition: A | Discharge status: Home / Self Care | Payer: Medicare Other | Source: Ambulatory Visit | Attending: Radiation Oncology

## 2023-04-21 DIAGNOSIS — C61 Malignant neoplasm of prostate: Secondary | ICD-10-CM | POA: Diagnosis not present

## 2023-04-21 DIAGNOSIS — C7951 Secondary malignant neoplasm of bone: Secondary | ICD-10-CM | POA: Diagnosis not present

## 2023-04-21 DIAGNOSIS — Z510 Encounter for antineoplastic radiation therapy: Secondary | ICD-10-CM | POA: Diagnosis not present

## 2023-04-21 DIAGNOSIS — Z192 Hormone resistant malignancy status: Secondary | ICD-10-CM | POA: Diagnosis not present

## 2023-04-21 DIAGNOSIS — Z923 Personal history of irradiation: Secondary | ICD-10-CM | POA: Diagnosis not present

## 2023-04-21 LAB — RAD ONC ARIA SESSION SUMMARY
Course Elapsed Days: 7
Plan Fractions Treated to Date: 6
Plan Prescribed Dose Per Fraction: 3 Gy
Plan Total Fractions Prescribed: 10
Plan Total Prescribed Dose: 30 Gy
Reference Point Dosage Given to Date: 18 Gy
Reference Point Session Dosage Given: 3 Gy
Session Number: 6

## 2023-04-22 ENCOUNTER — Ambulatory Visit
Admission: RE | Admit: 2023-04-22 | Discharge: 2023-04-22 | Disposition: A | Discharge status: Home / Self Care | Payer: Medicare Other | Source: Ambulatory Visit | Attending: Radiation Oncology | Admitting: Radiation Oncology

## 2023-04-22 ENCOUNTER — Other Ambulatory Visit: Payer: Self-pay

## 2023-04-22 DIAGNOSIS — Z510 Encounter for antineoplastic radiation therapy: Secondary | ICD-10-CM | POA: Diagnosis not present

## 2023-04-22 DIAGNOSIS — Z923 Personal history of irradiation: Secondary | ICD-10-CM | POA: Diagnosis not present

## 2023-04-22 DIAGNOSIS — Z192 Hormone resistant malignancy status: Secondary | ICD-10-CM | POA: Diagnosis not present

## 2023-04-22 DIAGNOSIS — G893 Neoplasm related pain (acute) (chronic): Secondary | ICD-10-CM | POA: Diagnosis not present

## 2023-04-22 DIAGNOSIS — C61 Malignant neoplasm of prostate: Secondary | ICD-10-CM | POA: Diagnosis not present

## 2023-04-22 DIAGNOSIS — C7951 Secondary malignant neoplasm of bone: Secondary | ICD-10-CM | POA: Diagnosis not present

## 2023-04-22 LAB — RAD ONC ARIA SESSION SUMMARY
Course Elapsed Days: 8
Plan Fractions Treated to Date: 7
Plan Prescribed Dose Per Fraction: 3 Gy
Plan Total Fractions Prescribed: 10
Plan Total Prescribed Dose: 30 Gy
Reference Point Dosage Given to Date: 21 Gy
Reference Point Session Dosage Given: 3 Gy
Session Number: 7

## 2023-04-23 ENCOUNTER — Ambulatory Visit
Admission: RE | Admit: 2023-04-23 | Discharge: 2023-04-23 | Disposition: A | Discharge status: Home / Self Care | Payer: Medicare Other | Source: Ambulatory Visit | Attending: Radiation Oncology | Admitting: Radiation Oncology

## 2023-04-23 ENCOUNTER — Inpatient Hospital Stay: Payer: Medicare Other | Admitting: Dietician

## 2023-04-23 ENCOUNTER — Other Ambulatory Visit: Payer: Self-pay

## 2023-04-23 DIAGNOSIS — Z192 Hormone resistant malignancy status: Secondary | ICD-10-CM | POA: Diagnosis not present

## 2023-04-23 DIAGNOSIS — Z923 Personal history of irradiation: Secondary | ICD-10-CM | POA: Diagnosis not present

## 2023-04-23 DIAGNOSIS — Z510 Encounter for antineoplastic radiation therapy: Secondary | ICD-10-CM | POA: Diagnosis not present

## 2023-04-23 DIAGNOSIS — C61 Malignant neoplasm of prostate: Secondary | ICD-10-CM | POA: Diagnosis not present

## 2023-04-23 DIAGNOSIS — C7951 Secondary malignant neoplasm of bone: Secondary | ICD-10-CM | POA: Diagnosis not present

## 2023-04-23 LAB — RAD ONC ARIA SESSION SUMMARY
Course Elapsed Days: 9
Plan Fractions Treated to Date: 8
Plan Prescribed Dose Per Fraction: 3 Gy
Plan Total Fractions Prescribed: 10
Plan Total Prescribed Dose: 30 Gy
Reference Point Dosage Given to Date: 24 Gy
Reference Point Session Dosage Given: 3 Gy
Session Number: 8

## 2023-04-23 NOTE — Progress Notes (Signed)
Nutrition Assessment   Reason for Assessment: Pt request   ASSESSMENT: 73 year old male with prostate cancer metastatic to bone, lung, and lymph nodes. S/p brachytherapy (08/2022). Patient will complete palliative radiation under the care of Dr. Kathrynn Running on 2/14. Planned to start chemotherapy with docetaxel q21d (first 3/5). Patient is under the care of Dr. Cherly Hensen.  Past medical history includes aneurysm of thoracic aorta, BPH, arthritis, dyslipidemia   Met with patient and wife in office following radiation. Patient reports good appetite at baseline. He is eating 3 meals + snacks. Patient and wife have been incorporating more fruits and vegetables since diagnosis. Recalls 1/2 banana, muffin, oatmeal for breakfast. Usually has leftovers for lunch. Dinner is balanced with protein, starch, and vegetable. Patient likes to snack on apples, granola bars, nuts. He reports nuts have been a bit hard on his stomach more recently. Patient is drinking 64 ounces of water. He reports poor sleep secondary to frequent urinary urge. Patient is tolerating radiation. He reports increased fatigue in the morning following Nubeqa. This last ~30 minutes. Patient reports occasional mild nausea. This improves after taking protonix. Patient is active. Reports daily walks up to 3 miles. Patient and wife have questions about chemotherapy + udenyca injection including side effects, infusion times, foods to eat during treatment.   Nutrition Focused Physical Exam: deferred   Medications: omeprozole, crestor, imitrex, rapaflo, vit D with Ca   Labs: no recent labs for review    Anthropometrics:   Height: 6' Weight: 183 lb 3.2 oz  UBW: 180-185 lb  BMI: 24.85   NUTRITION DIAGNOSIS: Food and nutrition related knowledge deficit related to cancer as evidenced by no prior need for associated nutrition information   INTERVENTION:  Educated on importance of adequate calorie and protein energy intake to minimize loss of LBM  during treatment Discussed foods with protein, recommend protein source at every [employer] on importance of hydration, continue to drink 64 ounces/day - suggested stopping fluid intake a few hours before bed to reduce overnight urgency Chemo education planned 2/26 - will defer specific chemo questions to educating RN All nutrition related questions answered Support and encouragement Contact information provided    MONITORING, EVALUATION, GOAL: Patient will tolerate adequate calorie and protein energy intake to minimize wt loss during treatment    Next Visit: Will plan to f/u with patient on cycle 2 to address any NIS

## 2023-04-24 ENCOUNTER — Ambulatory Visit
Admission: RE | Admit: 2023-04-24 | Discharge: 2023-04-24 | Disposition: A | Discharge status: Home / Self Care | Payer: Medicare Other | Source: Ambulatory Visit | Attending: Radiation Oncology

## 2023-04-24 ENCOUNTER — Other Ambulatory Visit: Payer: Self-pay

## 2023-04-24 DIAGNOSIS — C61 Malignant neoplasm of prostate: Secondary | ICD-10-CM | POA: Diagnosis not present

## 2023-04-24 DIAGNOSIS — C7951 Secondary malignant neoplasm of bone: Secondary | ICD-10-CM | POA: Diagnosis not present

## 2023-04-24 DIAGNOSIS — Z923 Personal history of irradiation: Secondary | ICD-10-CM | POA: Diagnosis not present

## 2023-04-24 DIAGNOSIS — Z510 Encounter for antineoplastic radiation therapy: Secondary | ICD-10-CM | POA: Diagnosis not present

## 2023-04-24 DIAGNOSIS — Z192 Hormone resistant malignancy status: Secondary | ICD-10-CM | POA: Diagnosis not present

## 2023-04-24 LAB — RAD ONC ARIA SESSION SUMMARY
Course Elapsed Days: 10
Plan Fractions Treated to Date: 9
Plan Prescribed Dose Per Fraction: 3 Gy
Plan Total Fractions Prescribed: 10
Plan Total Prescribed Dose: 30 Gy
Reference Point Dosage Given to Date: 27 Gy
Reference Point Session Dosage Given: 3 Gy
Session Number: 9

## 2023-04-25 ENCOUNTER — Ambulatory Visit
Admission: RE | Admit: 2023-04-25 | Discharge: 2023-04-25 | Disposition: A | Discharge status: Home / Self Care | Payer: Medicare Other | Source: Ambulatory Visit | Attending: Radiation Oncology | Admitting: Radiation Oncology

## 2023-04-25 ENCOUNTER — Other Ambulatory Visit: Payer: Self-pay | Admitting: Diagnostic Radiology

## 2023-04-25 ENCOUNTER — Other Ambulatory Visit: Payer: Self-pay

## 2023-04-25 DIAGNOSIS — C61 Malignant neoplasm of prostate: Secondary | ICD-10-CM

## 2023-04-25 DIAGNOSIS — Z192 Hormone resistant malignancy status: Secondary | ICD-10-CM | POA: Diagnosis not present

## 2023-04-25 DIAGNOSIS — Z510 Encounter for antineoplastic radiation therapy: Secondary | ICD-10-CM | POA: Diagnosis not present

## 2023-04-25 DIAGNOSIS — C7951 Secondary malignant neoplasm of bone: Secondary | ICD-10-CM | POA: Diagnosis not present

## 2023-04-25 DIAGNOSIS — Z923 Personal history of irradiation: Secondary | ICD-10-CM | POA: Diagnosis not present

## 2023-04-25 DIAGNOSIS — Z01818 Encounter for other preprocedural examination: Secondary | ICD-10-CM

## 2023-04-25 LAB — RAD ONC ARIA SESSION SUMMARY
Course Elapsed Days: 11
Plan Fractions Treated to Date: 10
Plan Prescribed Dose Per Fraction: 3 Gy
Plan Total Fractions Prescribed: 10
Plan Total Prescribed Dose: 30 Gy
Reference Point Dosage Given to Date: 30 Gy
Reference Point Session Dosage Given: 3 Gy
Session Number: 10

## 2023-04-27 NOTE — H&P (Signed)
 Chief Complaint: Concern for metastatic cancer  Referring Provider(s): Geanie Berlin  Supervising Physician: Richarda Overlie  Patient Status: Johnny Davenport - Out-pt  History of Present Illness: Johnny Davenport is a 73 y.o. 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   He is s/p brachytherapy in 08/2022    It appears he has rapid progression of disease now with significant metastases in the bones, lymph nodes as well as lungs.    Given his need for palliative radiation, IR is asked to obtain a biopsy for genetic testing.    He is NPO. No nausea/vomiting. No Fever/chills. ROS negative.    Patient is Full Code  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   Wears glasses     Past Surgical History:  Procedure Laterality Date   COLONOSCOPY  2021   TA/hems-5 yr recall   Hand/Finger surgery  1997   KNEE ARTHROSCOPY W/ MENISCAL REPAIR Right    x 2   POLYPECTOMY  2016   TA   PROSTATE BIOPSY     RADIOACTIVE SEED IMPLANT N/A 08/13/2022   Procedure: RADIOACTIVE SEED IMPLANT/BRACHYTHERAPY IMPLANT;  Surgeon: Belva Agee, MD;  Location: [facility];  Service: Urology;  Laterality: N/A;  90 MINS   ROTATOR CUFF REPAIR Left    SPACE OAR INSTILLATION N/A 08/13/2022   Procedure: SPACE OAR INSTILLATION;  Surgeon: Belva Agee, MD;  Location: [facility];  Service: Urology;  Laterality: N/A;   TONSILLECTOMY     WISDOM TOOTH EXTRACTION       Allergies: Other  Medications: Prior to Admission medications   Medication Sig Start Date End Date Taking? Authorizing Provider  darolutamide (NUBEQA) 300 MG tablet Take 2 tablets (600 mg total) by mouth 2 (two) times daily with a meal. 04/15/23   Melven Sartorius, MD  omeprazole (PRILOSEC) 20 MG capsule Take 1 capsule (20 mg total) by mouth daily. 01/27/23   Copland, Gwenlyn Found, MD  relugolix (ORGOVYX) 120 MG tablet Take 1 tablet (120 mg total) by mouth daily. 04/15/23   Melven Sartorius, MD  rosuvastatin (CRESTOR) 20 MG tablet Take 1 tablet (20 mg total) by mouth daily. 12/16/22   Copland, Gwenlyn Found, MD  silodosin (RAPAFLO) 8 MG CAPS capsule Take 8 mg by mouth daily. 10/03/21   [provider]  SUMAtriptan (IMITREX) 50 MG tablet Take 1 tablet (50 mg total) by mouth every 2 (two) hours as needed for migraine. Max 100 mg in 24 hours 10/21/22   Copland, Gwenlyn Found, MD     Family History  Problem Relation Age of Onset   Stroke Mother 65   Healthy Sister    Healthy Sister    Colon cancer Neg Hx    Colon polyps Neg Hx    Esophageal cancer Neg Hx    Stomach cancer Neg Hx    Rectal cancer Neg Hx  Social History   Socioeconomic History   Marital status: Married    Spouse name: Not on file   Number of children: Not on file   Years of education: Not on file   Highest education level: Master's degree (e.g., MA, MS, MEng, MEd, MSW, MBA)  Occupational History   Not on file  Tobacco Use   Smoking status: Never   Smokeless tobacco: Never  Vaping Use   Vaping status: Never Used  Substance and Sexual Activity   Alcohol use: Not Currently    Alcohol/week: 1.0 standard drink of alcohol    Types: 1 Standard drinks or equivalent per week   Drug use: No   Sexual activity: Yes    Birth control/protection: None  Other Topics Concern   Not on file  Social History Narrative   Not on file   Social Drivers of Health   Financial Resource Strain: Low Risk  (11/26/2022)   Overall  Financial Resource Strain (CARDIA)    Difficulty of Paying Living Expenses: Not hard at all  Food Insecurity: No Food Insecurity (04/10/2023)   Hunger Vital Sign    Worried About Running Out of Food in the Last Year: Never true    Ran Out of Food in the Last Year: Never true  Transportation Needs: No Transportation Needs (04/10/2023)   PRAPARE - Administrator, Civil Service (Medical): No    Lack of Transportation (Non-Medical): No  Physical Activity: Sufficiently Active (11/26/2022)   Exercise Vital Sign    Days of Exercise per Week: 5 days    Minutes of Exercise per Session: 30 min  Recent Concern: Physical Activity - Insufficiently Active (09/22/2022)   Exercise Vital Sign    Days of Exercise per Week: 3 days    Minutes of Exercise per Session: 40 min  Stress: No Stress Concern Present (11/26/2022)   [employer] of Occupational Health - Occupational Stress Questionnaire    Feeling of Stress : Not at all  Social Connections: Socially Integrated (11/26/2022)   Social Connection and Isolation Panel [NHANES]    Frequency of Communication with Friends and Family: More than three times a week    Frequency of Social Gatherings with Friends and Family: Three times a week    Attends Religious Services: 1 to 4 times per year    Active Member of Clubs or Organizations: Yes    Attends Banker Meetings: 1 to 4 times per year    Marital Status: Married     Review of Systems: A 12 point ROS discussed and pertinent positives are indicated in the HPI above.  All other systems are negative.  Vital Signs: There were no vitals taken for this visit.  Advance Care Plan: The advanced care place/surrogate decision maker was discussed at the time of visit and the patient did not wish to discuss or was not able to name a surrogate decision maker or provide an advance care plan.  Physical Exam Vitals reviewed.  Constitutional:      Appearance: Normal appearance.  HENT:      Head: Normocephalic and atraumatic.  Eyes:     Extraocular Movements: Extraocular movements intact.  Cardiovascular:     Rate and Rhythm: Normal rate and regular rhythm.  Pulmonary:     Effort: Pulmonary effort is normal. No respiratory distress.     Breath sounds: Normal breath sounds.  Abdominal:     General: There is no distension.     Palpations: Abdomen is soft.  Tenderness: There is no abdominal tenderness.  Musculoskeletal:        General: Normal range of motion.     Cervical back: Normal range of motion.  Skin:    General: Skin is warm and dry.  Neurological:     General: No focal deficit present.     Mental Status: He is alert and oriented to person, place, and time.  Psychiatric:        Mood and Affect: Mood normal.        Behavior: Behavior normal.        Thought Content: Thought content normal.        Judgment: Judgment normal.     Imaging: NM PET (PSMA) SKULL TO MID THIGH Result Date: 04/08/2023 CLINICAL DATA:  Prostate carcinoma with biochemical recurrence. Brachytherapy June 2024. EXAM: NUCLEAR MEDICINE PET SKULL BASE TO THIGH TECHNIQUE: mCi F18 Piflufolastat (Pylarify) was injected intravenously. Full-ring PET imaging was performed from the skull base to thigh after the radiotracer. CT data was obtained and used for attenuation correction and anatomic localization. COMPARISON:  None Available. FINDINGS: NECK No radiotracer activity in neck lymph nodes. Incidental CT finding: None. CHEST Multiple mediastinal and hilar radiotracer avid nodes. Example cluster of nodes in the LEFT hilum with SUV max equal 16.4. RIGHT lower paratracheal node measures 12 mm with SUV max equal 20 on image 59. Small pulmonary nodules in about the hilar structures with intense radiotracer activity. Example RIGHT upper lobe nodule measuring 6 mm (image 63/6) with SUV max 9. Example LEFT lobe nodule measuring 9 mm on image 64 SUV max equal 5.2. Approximately 10 radiotracer avid metastatic  pulmonary nodules. Incidental CT finding: None. ABDOMEN/PELVIS Prostate: Brachytherapy seeds within the prostate gland. Clear abnormal radiotracer activity. Lymph nodes: Within the LEFT mesorectal sheath irregular soft tissue nodule measuring 3 cm (image 154) has intense metabolic activity with SUV max equal 16.5. Similar smaller node in the RIGHT mesorectum on image 154. Conglomerate of lymph nodes in the presacral space measures 33 mm on image 142 with SUV max equal 17.3. Scattered small radiotracer avid nodules along the periaortic retroperitoneum Liver: No evidence of liver metastasis. Incidental CT finding: None. SKELETON There is extensive intense radiotracer avid skeletal metastasis. Entirety of the posterior LEFT and RIGHT iliac bone and sacrum are involved. Example in the LEFT sacrum / iliac bone with SUV max equal 23 on image 138. There is only there is mild subtle haziness within the bone this level. Multiple vertebral bodies are involved with radiotracer avid metastatic disease. For example L1 vertebral body with SUV max equal 24. Hypermetabolic metastasis extend in the shoulder girdles, proximal LEFT femur, and the RIGHT skull base. IMPRESSION: 1. Extensive radiotracer avid skeletal metastasis involving the axillary and appendicular skeleton. 2. Extensive radiotracer avid lymph node metastasis in the pelvis, retroperitoneum, and mediastinum. 3. Multiple radiotracer avid small pulmonary metastasis. Electronically Signed   By: Genevive Bi M.D.   On: 04/08/2023 16:40    Labs:  CBC: Recent Labs    10/21/22 0838  WBC 5.3  HGB 13.9  HCT 42.6  PLT 189.0    COAGS: No results for input(s): "INR", "APTT" in the last 8760 hours.  BMP: Recent Labs    05/13/22 0907 10/21/22 0838  NA 141 137  K 4.3 4.4  CL 103 101  CO2 31 31  GLUCOSE 87 84  BUN 16 21  CALCIUM 9.4 9.2  CREATININE 1.02 1.01    LIVER FUNCTION TESTS: Recent Labs    10/21/22  0838  BILITOT 0.7  AST 24  ALT 22   ALKPHOS 80  PROT 6.3  ALBUMIN 4.3    TUMOR MARKERS: No results for input(s): "AFPTM", "CEA", "CA199", "CHROMGRNA" in the last 8760 hours.  Assessment and Plan:  Metastatic prostate cancer.  Will proceed with right iliac bone biopsy for genetic testing today by Dr. Lowella Dandy.  Risks and benefits of iliac bone biopsy was discussed with the patient and/or patient's family including, but not limited to bleeding, infection, damage to adjacent structures or low yield requiring additional tests.  All of the questions were answered and there is agreement to proceed.  Consent signed and in chart.  Thank you for allowing our service to participate in Johnny Davenport 's care.  Electronically Signed: Gwynneth Macleod, PA-C   04/27/2023, 11:10 AM      I spent a total of  30 Minutes   in face to face in clinical consultation, greater than 50% of which was counseling/coordinating care for iliac bone lesion biopsy.

## 2023-04-28 ENCOUNTER — Ambulatory Visit (HOSPITAL_COMMUNITY)
Admission: RE | Admit: 2023-04-28 | Discharge: 2023-04-28 | Disposition: A | Discharge status: Home / Self Care | Payer: Medicare Other | Source: Ambulatory Visit

## 2023-04-28 ENCOUNTER — Other Ambulatory Visit: Payer: Self-pay

## 2023-04-28 ENCOUNTER — Encounter (HOSPITAL_COMMUNITY): Payer: Self-pay

## 2023-04-28 DIAGNOSIS — J984 Other disorders of lung: Secondary | ICD-10-CM | POA: Insufficient documentation

## 2023-04-28 DIAGNOSIS — N400 Enlarged prostate without lower urinary tract symptoms: Secondary | ICD-10-CM | POA: Insufficient documentation

## 2023-04-28 DIAGNOSIS — E785 Hyperlipidemia, unspecified: Secondary | ICD-10-CM | POA: Diagnosis not present

## 2023-04-28 DIAGNOSIS — C7951 Secondary malignant neoplasm of bone: Secondary | ICD-10-CM | POA: Insufficient documentation

## 2023-04-28 DIAGNOSIS — Z923 Personal history of irradiation: Secondary | ICD-10-CM | POA: Diagnosis not present

## 2023-04-28 DIAGNOSIS — M1990 Unspecified osteoarthritis, unspecified site: Secondary | ICD-10-CM | POA: Diagnosis not present

## 2023-04-28 DIAGNOSIS — C7802 Secondary malignant neoplasm of left lung: Secondary | ICD-10-CM | POA: Insufficient documentation

## 2023-04-28 DIAGNOSIS — Z01818 Encounter for other preprocedural examination: Secondary | ICD-10-CM

## 2023-04-28 DIAGNOSIS — C61 Malignant neoplasm of prostate: Secondary | ICD-10-CM | POA: Diagnosis not present

## 2023-04-28 DIAGNOSIS — C7801 Secondary malignant neoplasm of right lung: Secondary | ICD-10-CM | POA: Insufficient documentation

## 2023-04-28 DIAGNOSIS — M899 Disorder of bone, unspecified: Secondary | ICD-10-CM | POA: Diagnosis not present

## 2023-04-28 DIAGNOSIS — C771 Secondary and unspecified malignant neoplasm of intrathoracic lymph nodes: Secondary | ICD-10-CM | POA: Insufficient documentation

## 2023-04-28 LAB — PROTIME-INR
INR: 1 (ref 0.8–1.2)
Prothrombin Time: 13.5 s (ref 11.4–15.2)

## 2023-04-28 LAB — CBC
HCT: 38.6 % — ABNORMAL LOW (ref 39.0–52.0)
Hemoglobin: 12.8 g/dL — ABNORMAL LOW (ref 13.0–17.0)
MCH: 31.6 pg (ref 26.0–34.0)
MCHC: 33.2 g/dL (ref 30.0–36.0)
MCV: 95.3 fL (ref 80.0–100.0)
Platelets: 374 10*3/uL (ref 150–400)
RBC: 4.05 MIL/uL — ABNORMAL LOW (ref 4.22–5.81)
RDW: 12.6 % (ref 11.5–15.5)
WBC: 4.9 10*3/uL (ref 4.0–10.5)
nRBC: 0 % (ref 0.0–0.2)

## 2023-04-28 MED ORDER — MIDAZOLAM HCL 2 MG/2ML IJ SOLN
INTRAMUSCULAR | Status: AC
  Filled 2023-04-28: qty 2

## 2023-04-28 MED ORDER — LIDOCAINE HCL 1 % IJ SOLN
10.0000 mL | Freq: Once | INTRAMUSCULAR | Status: AC
  Administered 2023-04-28: 10 mL via INTRADERMAL

## 2023-04-28 MED ORDER — FENTANYL CITRATE (PF) 100 MCG/2ML IJ SOLN
INTRAMUSCULAR | Status: AC
  Filled 2023-04-28: qty 2

## 2023-04-28 MED ORDER — MIDAZOLAM HCL 2 MG/2ML IJ SOLN
INTRAMUSCULAR | Status: AC | PRN
  Administered 2023-04-28: 1 mg via INTRAVENOUS

## 2023-04-28 MED ORDER — FENTANYL CITRATE (PF) 100 MCG/2ML IJ SOLN
INTRAMUSCULAR | Status: AC | PRN
  Administered 2023-04-28: 25 ug via INTRAVENOUS
  Administered 2023-04-28: 50 ug via INTRAVENOUS

## 2023-04-28 NOTE — Procedures (Signed)
 Interventional Radiology Procedure:   Indications: Prostate cancer  Procedure: CT guided right iliac bone biopsy  Findings: Single core biopsy from right iliac bone.  Complications: None     EBL: Minimal, less than 10 ml  Plan: Discharge to home in one hour.   Kiasha Bellin R. Lowella Dandy, MD  Pager: [phone]

## 2023-04-28 NOTE — Radiation Completion Notes (Addendum)
  Radiation Oncology         [phone] ________________________________  Name: Johnny Davenport MRN: [id number]  Date: 04/25/2023  DOB: 1950/08/05  Referring Physician: Karoline Caldwell, M.D. Date of Service: 2023-04-28 Radiation Oncologist: Margaretmary Bayley, M.D. [facility]     RADIATION ONCOLOGY END OF TREATMENT NOTE     Diagnosis: 73 y.o. gentleman with newly diagnosed widespread metastatic prostate cancer with bilateral painful hips and PSA of 107   Intent: Palliative     ==========DELIVERED PLANS==========  First Treatment Date: 2023-04-14 Last Treatment Date: 2023-04-25   Plan Name: Pelvis_Hips Site: Pelvis Technique: 3D Mode: Photon Dose Per Fraction: 3 Gy Prescribed Dose (Delivered / Prescribed): 30 Gy / 30 Gy Prescribed Fxs (Delivered / Prescribed): 10 / 10     ==========ON TREATMENT VISIT DATES========== 2023-04-18, 2023-04-25   See weekly On Treatment Notes in Epic for details in the Media tab (listed as Progress notes on the On Treatment Visit Dates listed above).  He tolerated the radiation treatments relatively well with only modest fatigue.  The patient will receive a call in about one month from the radiation oncology department. He will continue follow up with his urologist, Dr. Berneice Heinrich and medical oncologist, Dr. Cherly Hensen, as well.  ------------------------------------------------   Margaretmary Dys, MD [facility]  Radiation Oncology Direct Dial: [phone]  Fax: [phone] [email or web address]  Skype  LinkedIn

## 2023-04-29 MED ORDER — GABAPENTIN 100 MG PO CAPS: 100.0000 mg | ORAL_CAPSULE | Freq: Three times a day (TID) | ORAL | 0 refills | Status: DC

## 2023-04-29 NOTE — Progress Notes (Signed)
 Patient requesting refill on Gabapentin for pain control.  Reports medication is working well.  MD notified and agreeable.  Rx sent to pharmacy.   Patient had CT guided biopsy of right iliac bone on 2/17; pathology results pending at this time.

## 2023-05-01 LAB — SURGICAL PATHOLOGY

## 2023-05-02 ENCOUNTER — Other Ambulatory Visit: Payer: Self-pay

## 2023-05-02 ENCOUNTER — Other Ambulatory Visit: Payer: Self-pay | Admitting: Pharmacy Technician

## 2023-05-02 ENCOUNTER — Other Ambulatory Visit (HOSPITAL_COMMUNITY): Payer: Self-pay

## 2023-05-02 NOTE — Progress Notes (Signed)
 RN spoke with patient and informed that MD reviewed pathology confirming prostate cancer and continue recommendations for Nubeqa, Orgovyx, and Taxotere.  Patient verbalized understanding.  Plan of care in process.

## 2023-05-02 NOTE — Progress Notes (Signed)
 Specialty Pharmacy Initial Fill Coordination Note  Johnny Davenport is a 73 y.o. male contacted today regarding refills of specialty medication(s) Darolutamide Merleen Nicely); Relugolix (ORGOVYX) .  Patient requested Delivery  on 05/06/23  to verified address [address]   [place]  [address]   Medication will be filled on 05/05/23.   Patient is aware of $1,923.60 copayment.

## 2023-05-05 ENCOUNTER — Other Ambulatory Visit: Payer: Self-pay

## 2023-05-05 NOTE — Progress Notes (Signed)

## 2023-05-06 ENCOUNTER — Other Ambulatory Visit: Payer: Self-pay

## 2023-05-06 DIAGNOSIS — Z8546 Personal history of malignant neoplasm of prostate: Secondary | ICD-10-CM

## 2023-05-06 MED ORDER — DEXAMETHASONE 4 MG PO TABS: ORAL_TABLET | ORAL | 1 refills | Status: DC

## 2023-05-06 MED ORDER — ONDANSETRON HCL 8 MG PO TABS: 8.0000 mg | ORAL_TABLET | Freq: Three times a day (TID) | ORAL | 1 refills | Status: DC | PRN

## 2023-05-06 MED ORDER — PROCHLORPERAZINE MALEATE 10 MG PO TABS: 10.0000 mg | ORAL_TABLET | Freq: Four times a day (QID) | ORAL | 1 refills | Status: DC | PRN

## 2023-05-06 NOTE — Progress Notes (Signed)
 Patient counseled in telephone encounter opened on 04/15/2023 for orgovyx and nubeqa. Patient received first fill of medications from [facility].   Bethel Born, PharmD Hematology/Oncology Clinical Pharmacist [facility] [phone]

## 2023-05-07 ENCOUNTER — Inpatient Hospital Stay: Payer: Medicare Other

## 2023-05-07 ENCOUNTER — Other Ambulatory Visit: Payer: Self-pay

## 2023-05-07 ENCOUNTER — Encounter: Payer: Self-pay | Admitting: Family Medicine

## 2023-05-07 DIAGNOSIS — K219 Gastro-esophageal reflux disease without esophagitis: Secondary | ICD-10-CM

## 2023-05-07 DIAGNOSIS — C61 Malignant neoplasm of prostate: Secondary | ICD-10-CM

## 2023-05-07 LAB — CBC WITH DIFFERENTIAL (CANCER CENTER ONLY)
Abs Immature Granulocytes: 0.02 10*3/uL (ref 0.00–0.07)
Basophils Absolute: 0 10*3/uL (ref 0.0–0.1)
Basophils Relative: 1 %
Eosinophils Absolute: 0.3 10*3/uL (ref 0.0–0.5)
Eosinophils Relative: 6 %
HCT: 38.8 % — ABNORMAL LOW (ref 39.0–52.0)
Hemoglobin: 12.5 g/dL — ABNORMAL LOW (ref 13.0–17.0)
Immature Granulocytes: 0 %
Lymphocytes Relative: 11 %
Lymphs Abs: 0.5 10*3/uL — ABNORMAL LOW (ref 0.7–4.0)
MCH: 30.6 pg (ref 26.0–34.0)
MCHC: 32.2 g/dL (ref 30.0–36.0)
MCV: 94.9 fL (ref 80.0–100.0)
Monocytes Absolute: 0.5 10*3/uL (ref 0.1–1.0)
Monocytes Relative: 10 %
Neutro Abs: 3.5 10*3/uL (ref 1.7–7.7)
Neutrophils Relative %: 72 %
Platelet Count: 264 10*3/uL (ref 150–400)
RBC: 4.09 MIL/uL — ABNORMAL LOW (ref 4.22–5.81)
RDW: 13.1 % (ref 11.5–15.5)
WBC Count: 4.9 10*3/uL (ref 4.0–10.5)
nRBC: 0 % (ref 0.0–0.2)

## 2023-05-07 MED ORDER — OMEPRAZOLE 20 MG PO CPDR: 20.0000 mg | DELAYED_RELEASE_CAPSULE | Freq: Every day | ORAL | 3 refills | Status: AC

## 2023-05-09 ENCOUNTER — Other Ambulatory Visit: Payer: Self-pay

## 2023-05-10 LAB — PROSTATE-SPECIFIC AG, SERUM (LABCORP): Prostate Specific Ag, Serum: 1.7 ng/mL (ref 0.0–4.0)

## 2023-05-12 NOTE — Progress Notes (Unsigned)
 Patient Care Team: Copland, Gwenlyn Found, MD as PCP - General (Family Medicine) Cherlyn Cushing, RN as Oncology Nurse Navigator Manny, Delbert Phenix., MD as Consulting Physician (Urology) Margaretmary Dys, MD as Consulting Physician (Radiation Oncology) Maryclare Labrador, RN as Registered Nurse Axel Filler, Larna Daughters, NP as Nurse Practitioner (Hematology and Oncology)  Clinic Day:  05/13/2023  Referring physician: Melven Sartorius, MD  ASSESSMENT & PLAN:   Assessment & Plan: Johnny Davenport is a 73 y.o.male with history of BPH, arthritis, hyperlipidemia, prostate cancer [facility] cancer.   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  Germline testing: will obtain with next lab draw Somatic testing: will request [facility] on Surgical biopsy Treatment: s/p brachytherapy in 08/2022   He gets monthly shipment of daro and relugolix to his house.  We discussed natural course of metastatic prostate cancer again today.  Patient and his wife both had many thoughtful questions and were answered.  Will request [facility] tissue on his surgical biopsy and blood for genetic testing with next draw in 3 weeks.  Prostate cancer metastatic to multiple sites [facility]) Recommend proceed with docetaxel We will follow-up PSA each cycle  At risk for side effect of medication Supportive baseline bone mineral density study and then every 2 years calcium (1000-1200 mg daily from food and supplements) and vitamin D3 (1000 IU daily) Control and prevent diabetes Aggressive cardiovascular risk management Weight-bearing exercises (30 minutes per day) Limit alcohol consumption and avoid smoking   Total time 35 minutes.  The patient understands the plans discussed today and is in agreement with them.  He knows to contact our office if he develops concerns  prior to his next appointment.  Melven Sartorius, MD  [facility] - A DEPT OF [facility] [address] [place] [address] Dept: [phone] Dept Fax: [phone]   Orders Placed This Encounter  Procedures   Lactate dehydrogenase    Standing Status:   Future    Expected Date:   06/04/2023    Expiration Date:   06/03/2024   Lactate dehydrogenase    Standing Status:   Future    Expected Date:   06/25/2023    Expiration Date:   06/24/2024   Lactate dehydrogenase    Standing Status:   Future    Expected Date:   07/16/2023    Expiration Date:   07/15/2024      CHIEF COMPLAINT:  CC: mHSPC  Current Treatment:  ADT/daro and adding docetaxel  INTERVAL HISTORY:  Johnny Davenport is here today for repeat clinical assessment. He reports a little tired initially on daro and improved. Energy level has improved. Not much hot flashes. Walking 5 miles without problem.    Taking calcium and vitamin D.  I have reviewed the past medical history, past surgical history, social history and family history with the patient and they are unchanged from previous note.  ALLERGIES:  is allergic to other.  MEDICATIONS:  Current Outpatient Medications  Medication Sig Dispense Refill   darolutamide (NUBEQA) 300 MG tablet Take 2 tablets (600 mg total) by mouth 2 (two) times daily with a meal. 120 tablet 11   dexamethasone (DECADRON) 4 MG tablet Take 2 tabs by mouth 2 times daily starting day before chemo. Then take 2 tabs daily for 2  days starting day after chemo. Take with food. 30 tablet 1   gabapentin (NEURONTIN) 100 MG capsule Take 1 capsule (100 mg total) by mouth 3 (three) times daily. 270 capsule 0   omeprazole (PRILOSEC) 20 MG capsule Take 1 capsule (20 mg total) by mouth daily. 90 capsule 3   ondansetron (ZOFRAN) 8 MG tablet Take 1 tablet (8 mg total) by mouth every 8 (eight) hours as needed for nausea or vomiting. 30 tablet 1    prochlorperazine (COMPAZINE) 10 MG tablet Take 1 tablet (10 mg total) by mouth every 6 (six) hours as needed for nausea or vomiting (take twice daily on days 1 to 3 of chemotherapy, then follow as needed instruction if feeling fine by day 4). 30 tablet 1   relugolix (ORGOVYX) 120 MG tablet Take 1 tablet (120 mg total) by mouth daily. 30 tablet 11   SUMAtriptan (IMITREX) 50 MG tablet Take 1 tablet (50 mg total) by mouth every 2 (two) hours as needed for migraine. Max 100 mg in 24 hours 10 tablet 6   No current facility-administered medications for this visit.    HISTORY OF PRESENT ILLNESS:   Oncology History  History of prostate cancer  05/06/2022 Cancer Staging   Staging form: Prostate, AJCC 8th Edition - Clinical stage from 05/06/2022: Stage IIB (cT2, cN0, cM0, PSA: 7.3, Grade Group: 2) - Signed by Marcello Fennel, PA-C on 07/04/2022 Histopathologic type: Adenocarcinoma, NOS Stage prefix: Initial diagnosis Prostate specific antigen (PSA) range: Less than 10 Gleason primary pattern: 3 Gleason secondary pattern: 4 Gleason score: 7 Histologic grading system: 5 grade system Number of biopsy cores examined: 12 Number of biopsy cores positive: 4 Location of positive needle core biopsies: One side   05/28/2022 Initial Diagnosis   Malignant neoplasm of prostate   03/27/2023 Imaging   MRI L Hip Heterogeneous osseous metastasis involving the pelvis involving the L4 and L5 vertebral bodies Probable extraosseous extension and small pathologic fracture of left ischial tuberosity with cortical disruption laterally.  Moderate sized partial-thickness tear of the adjacent left hamstring tendon origin Bone marrow edema surrounding the left SI joint with mild edema type signal along the superior margin of the left piriformis muscle.  Finding could be secondary to extraosseous extension of metastasis or represent posttraumatic change Small partial-thickness tear in the left gluteus muscle minimus tendon at  the insertion on the greater trochanter with associated reactive bone marrow edema in the superior greater trochanter. Small low signal intensity focus along the insertional fibers of the left gluteus tendons which could represent calcium hydroxyapatite deposit Mild osteoarthritis of both. Possible enlarged left internal iliac/perirectal lymph node measuring 1.8 cm   04/08/2023 PET scan   PSMA PET 1. Extensive radiotracer avid skeletal metastasis involving the axillary and appendicular skeleton. 2. Extensive radiotracer avid lymph node metastasis in the pelvis, retroperitoneum, and mediastinum. 3. Multiple radiotracer avid small pulmonary metastasis.   04/10/2023 Tumor Marker   PSA 107   05/14/2023 -  Chemotherapy   Patient is on Treatment Plan : PROSTATE Docetaxel (73) q21d     Prostate cancer metastatic to multiple sites (HCC)  04/08/2023 PET scan   PSMA PET 1. Extensive radiotracer avid skeletal metastasis involving the axillary and appendicular skeleton. 2. Extensive radiotracer avid lymph node metastasis in the pelvis, retroperitoneum, and mediastinum. 3. Multiple radiotracer avid small pulmonary metastasis.   04/09/2023 Initial Diagnosis   Prostate cancer metastatic to multiple sites [facility])   04/10/2023 Tumor Marker   PSA 107   04/10/2023 Cancer Staging  Staging form: Prostate, AJCC 8th Edition - Clinical stage from 04/10/2023: Stage IVB (rcT0, cN1, cM1c, PSA: 107, Grade Group: 2) - Signed by Margaretmary Dys, MD on 04/11/2023 Stage prefix: Recurrence Prostate specific antigen (PSA) range: 20 or greater Gleason score: 7 Histologic grading system: 5 grade system       REVIEW OF SYSTEMS:   All relevant systems were reviewed with the patient and are negative.   VITALS:  Blood pressure (!) 145/76, pulse 97, temperature 98.1 F (36.7 C), temperature source Temporal, resp. rate 17, weight 182 lb 12.8 oz (82.9 kg), SpO2 99%.  Wt Readings from Last 3 Encounters:  05/13/23  182 lb 12.8 oz (82.9 kg)  04/28/23 180 lb (81.6 kg)  04/11/23 183 lb 3.2 oz (83.1 kg)    Body mass index is 24.79 kg/m.  Performance status (ECOG): 0 - Asymptomatic  PHYSICAL EXAM:   GENERAL: alert, no distress and comfortable SKIN: skin color normal, no jaundice   LABORATORY DATA:  I have reviewed the data as listed    Component Value Date/Time   NA 137 05/13/2023 1325   K 4.2 05/13/2023 1325   CL 103 05/13/2023 1325   CO2 28 05/13/2023 1325   GLUCOSE 196 (H) 05/13/2023 1325   BUN 19 05/13/2023 1325   BUN 19 08/20/2016 0000   CREATININE 0.90 05/13/2023 1325   CALCIUM 9.3 05/13/2023 1325   PROT 7.1 05/13/2023 1325   ALBUMIN 4.2 05/13/2023 1325   AST 18 05/13/2023 1325   ALT 16 05/13/2023 1325   ALKPHOS 166 (H) 05/13/2023 1325   BILITOT 0.4 05/13/2023 1325   GFRNONAA >60 05/13/2023 1325    No results found for: "SPEP", "UPEP"  Lab Results  Component Value Date   WBC 4.2 05/13/2023   NEUTROABS 3.9 05/13/2023   HGB 12.7 (L) 05/13/2023   HCT 38.6 (L) 05/13/2023   MCV 95.3 05/13/2023   PLT 282 05/13/2023      Chemistry      Component Value Date/Time   NA 137 05/13/2023 1325   K 4.2 05/13/2023 1325   CL 103 05/13/2023 1325   CO2 28 05/13/2023 1325   BUN 19 05/13/2023 1325   BUN 19 08/20/2016 0000   CREATININE 0.90 05/13/2023 1325      Component Value Date/Time   CALCIUM 9.3 05/13/2023 1325   ALKPHOS 166 (H) 05/13/2023 1325   AST 18 05/13/2023 1325   ALT 16 05/13/2023 1325   BILITOT 0.4 05/13/2023 1325       RADIOGRAPHIC STUDIES: I have personally reviewed the radiological images as listed and agreed with the findings in the report. CT BIOPSY Result Date: 04/28/2023 INDICATION: 73 year old with prostate cancer. PSMA PET demonstrates diffuse activity throughout the bones including diffuse activity throughout the posterior right ilium. Request for a bone biopsy. EXAM: CT GUIDED BIOPSY OF RIGHT ILIAC BONE Physician: Rachelle Hora. Henn, MD MEDICATIONS: Moderate  sedation ANESTHESIA/SEDATION: Moderate (conscious) sedation was employed during this procedure. A total of Versed 1.0mg  and fentanyl 75 mcg was administered intravenously at the order of the provider performing the procedure. Total intra-service moderate sedation time: 18 minutes. Patient's level of consciousness and vital signs were monitored continuously by radiology nurse throughout the procedure under the supervision of the provider performing the procedure. COMPLICATIONS: None immediate. PROCEDURE: The procedure was explained to the patient. The risks and benefits of the procedure were discussed and the patient's questions were addressed. Informed consent was obtained from the patient. The patient was placed prone on CT table. Time-out  was performed. Images of the pelvis were obtained. The right side of back was prepped and draped in sterile fashion. The skin and right posterior ilium were anesthetized with 1% lidocaine. OnControl bone needle was directed into the right ilium with CT guidance. Single large core biopsy was obtained using the OnControl drill. Specimen placed in formalin. Bandage placed over the puncture site. FINDINGS: Iliac bones are slightly heterogeneous particularly the right side. Diffuse activity involving the posterior iliac bones on the PSMA PET. Biopsy needle was directed into the posterior right ilium and a single large core biopsy was obtained. No immediate bleeding or hematoma formation. IMPRESSION: CT-guided biopsy of the posterior right iliac bone. Electronically Signed   By: Richarda Overlie M.D.   On: 04/28/2023 09:36

## 2023-05-13 ENCOUNTER — Telehealth: Payer: Self-pay

## 2023-05-13 ENCOUNTER — Inpatient Hospital Stay: Payer: Medicare Other

## 2023-05-13 ENCOUNTER — Other Ambulatory Visit: Payer: Self-pay

## 2023-05-13 VITALS — BP 145/76 | HR 97 | Temp 98.1°F | Resp 17 | Wt 182.8 lb

## 2023-05-13 DIAGNOSIS — Z5111 Encounter for antineoplastic chemotherapy: Secondary | ICD-10-CM | POA: Insufficient documentation

## 2023-05-13 DIAGNOSIS — Z8546 Personal history of malignant neoplasm of prostate: Secondary | ICD-10-CM

## 2023-05-13 DIAGNOSIS — C61 Malignant neoplasm of prostate: Secondary | ICD-10-CM | POA: Diagnosis not present

## 2023-05-13 DIAGNOSIS — D6481 Anemia due to antineoplastic chemotherapy: Secondary | ICD-10-CM | POA: Insufficient documentation

## 2023-05-13 DIAGNOSIS — Z9189 Other specified personal risk factors, not elsewhere classified: Secondary | ICD-10-CM

## 2023-05-13 DIAGNOSIS — K1230 Oral mucositis (ulcerative), unspecified: Secondary | ICD-10-CM | POA: Diagnosis not present

## 2023-05-13 DIAGNOSIS — H6692 Otitis media, unspecified, left ear: Secondary | ICD-10-CM | POA: Insufficient documentation

## 2023-05-13 DIAGNOSIS — C7951 Secondary malignant neoplasm of bone: Secondary | ICD-10-CM | POA: Diagnosis not present

## 2023-05-13 DIAGNOSIS — C7800 Secondary malignant neoplasm of unspecified lung: Secondary | ICD-10-CM | POA: Diagnosis not present

## 2023-05-13 DIAGNOSIS — Z79899 Other long term (current) drug therapy: Secondary | ICD-10-CM | POA: Insufficient documentation

## 2023-05-13 LAB — CBC WITH DIFFERENTIAL (CANCER CENTER ONLY)
Abs Immature Granulocytes: 0.01 10*3/uL (ref 0.00–0.07)
Basophils Absolute: 0 10*3/uL (ref 0.0–0.1)
Basophils Relative: 0 %
Eosinophils Absolute: 0 10*3/uL (ref 0.0–0.5)
Eosinophils Relative: 0 %
HCT: 38.6 % — ABNORMAL LOW (ref 39.0–52.0)
Hemoglobin: 12.7 g/dL — ABNORMAL LOW (ref 13.0–17.0)
Immature Granulocytes: 0 %
Lymphocytes Relative: 5 %
Lymphs Abs: 0.2 10*3/uL — ABNORMAL LOW (ref 0.7–4.0)
MCH: 31.4 pg (ref 26.0–34.0)
MCHC: 32.9 g/dL (ref 30.0–36.0)
MCV: 95.3 fL (ref 80.0–100.0)
Monocytes Absolute: 0.1 10*3/uL (ref 0.1–1.0)
Monocytes Relative: 1 %
Neutro Abs: 3.9 10*3/uL (ref 1.7–7.7)
Neutrophils Relative %: 94 %
Platelet Count: 282 10*3/uL (ref 150–400)
RBC: 4.05 MIL/uL — ABNORMAL LOW (ref 4.22–5.81)
RDW: 13.6 % (ref 11.5–15.5)
WBC Count: 4.2 10*3/uL (ref 4.0–10.5)
nRBC: 0 % (ref 0.0–0.2)

## 2023-05-13 LAB — CMP (CANCER CENTER ONLY)
ALT: 16 U/L (ref 0–44)
AST: 18 U/L (ref 15–41)
Albumin: 4.2 g/dL (ref 3.5–5.0)
Alkaline Phosphatase: 166 U/L — ABNORMAL HIGH (ref 38–126)
Anion gap: 6 (ref 5–15)
BUN: 19 mg/dL (ref 8–23)
CO2: 28 mmol/L (ref 22–32)
Calcium: 9.3 mg/dL (ref 8.9–10.3)
Chloride: 103 mmol/L (ref 98–111)
Creatinine: 0.9 mg/dL (ref 0.61–1.24)
GFR, Estimated: >60 mL/min (ref >60)
Glucose, Bld: 196 mg/dL — ABNORMAL HIGH (ref 70–99)
Potassium: 4.2 mmol/L (ref 3.5–5.1)
Sodium: 137 mmol/L (ref 135–145)
Total Bilirubin: 0.4 mg/dL (ref 0.0–1.2)
Total Protein: 7.1 g/dL (ref 6.5–8.1)

## 2023-05-13 NOTE — Assessment & Plan Note (Signed)
 Recommend proceed with docetaxel We will follow-up PSA each cycle

## 2023-05-13 NOTE — Telephone Encounter (Signed)
 Orders for blood and bone lesion bx from 04/28/23 submitted to [facility] online portal as per Dr. Nelta Numbers orders. Per Dr. Cherly Hensen, blood sample to be collected at lab appointment on 06/03/23.

## 2023-05-13 NOTE — Assessment & Plan Note (Signed)
 Supportive baseline bone mineral density study and then every 2 years calcium (1000-1200 mg daily from food and supplements) and vitamin D3 (1000 IU daily) Control and prevent diabetes Aggressive cardiovascular risk management Weight-bearing exercises (30 minutes per day) Limit alcohol consumption and avoid smoking

## 2023-05-13 NOTE — Telephone Encounter (Signed)
-----   Message from Melven Sartorius sent at 05/13/2023  2:52 PM EST ----- Angelica Chessman would you send in [facility] tissue on his 2/17 biopsy?   Marisue Ivan. Would you request genetic testing to be drawn together with his next lab draw with cycle 2? Thank you both.

## 2023-05-14 ENCOUNTER — Inpatient Hospital Stay: Payer: Medicare Other

## 2023-05-14 VITALS — BP 133/79 | HR 71 | Temp 98.3°F | Resp 16 | Wt 183.0 lb

## 2023-05-14 DIAGNOSIS — Z79899 Other long term (current) drug therapy: Secondary | ICD-10-CM | POA: Diagnosis not present

## 2023-05-14 DIAGNOSIS — H6692 Otitis media, unspecified, left ear: Secondary | ICD-10-CM | POA: Diagnosis not present

## 2023-05-14 DIAGNOSIS — Z5111 Encounter for antineoplastic chemotherapy: Secondary | ICD-10-CM | POA: Diagnosis not present

## 2023-05-14 DIAGNOSIS — C7800 Secondary malignant neoplasm of unspecified lung: Secondary | ICD-10-CM | POA: Diagnosis not present

## 2023-05-14 DIAGNOSIS — C61 Malignant neoplasm of prostate: Secondary | ICD-10-CM | POA: Diagnosis not present

## 2023-05-14 DIAGNOSIS — K1230 Oral mucositis (ulcerative), unspecified: Secondary | ICD-10-CM | POA: Diagnosis not present

## 2023-05-14 DIAGNOSIS — D6481 Anemia due to antineoplastic chemotherapy: Secondary | ICD-10-CM | POA: Diagnosis not present

## 2023-05-14 DIAGNOSIS — C7951 Secondary malignant neoplasm of bone: Secondary | ICD-10-CM | POA: Diagnosis not present

## 2023-05-14 DIAGNOSIS — Z8546 Personal history of malignant neoplasm of prostate: Secondary | ICD-10-CM

## 2023-05-14 LAB — TESTOSTERONE: Testosterone: <3 ng/dL — ABNORMAL LOW (ref 264–916)

## 2023-05-14 LAB — PROSTATE-SPECIFIC AG, SERUM (LABCORP): Prostate Specific Ag, Serum: 0.9 ng/mL (ref 0.0–4.0)

## 2023-05-14 MED ORDER — SODIUM CHLORIDE 0.9 % IV SOLN: INTRAVENOUS | Status: DC

## 2023-05-14 MED ORDER — SODIUM CHLORIDE 0.9 % IV SOLN
75.0000 mg/m2 | Freq: Once | INTRAVENOUS | Status: AC
  Administered 2023-05-14: 160 mg via INTRAVENOUS
  Filled 2023-05-14: qty 16

## 2023-05-14 MED ORDER — DEXAMETHASONE SODIUM PHOSPHATE 10 MG/ML IJ SOLN
10.0000 mg | Freq: Once | INTRAMUSCULAR | Status: AC
  Administered 2023-05-14: 10 mg via INTRAVENOUS
  Filled 2023-05-14: qty 1

## 2023-05-14 NOTE — Patient Instructions (Signed)
 [facility]  Discharge Instructions: Thank you for choosing [facility] to provide your oncology and hematology care.   If you have a lab appointment with the Cancer Center, please go directly to the Cancer Center and check in at the registration area.   Wear comfortable clothing and clothing appropriate for easy access to any Portacath or PICC line.   We strive to give you quality time with your provider. You may need to reschedule your appointment if you arrive late (15 or more minutes).  Arriving late affects you and other patients whose appointments are after yours.  Also, if you miss three or more appointments without notifying the office, you may be dismissed from the clinic at the provider's discretion.      For prescription refill requests, have your pharmacy contact our office and allow 72 hours for refills to be completed.    Today you received the following chemotherapy and/or immunotherapy agents Docetaxel (Taxotere)      To help prevent nausea and vomiting after your treatment, we encourage you to take your nausea medication as directed.  BELOW ARE SYMPTOMS THAT SHOULD BE REPORTED IMMEDIATELY: *FEVER GREATER THAN 100.4 F (38 C) OR HIGHER *CHILLS OR SWEATING *NAUSEA AND VOMITING THAT IS NOT CONTROLLED WITH YOUR NAUSEA MEDICATION *UNUSUAL SHORTNESS OF BREATH *UNUSUAL BRUISING OR BLEEDING *URINARY PROBLEMS (pain or burning when urinating, or frequent urination) *BOWEL PROBLEMS (unusual diarrhea, constipation, pain near the anus) TENDERNESS IN MOUTH AND THROAT WITH OR WITHOUT PRESENCE OF ULCERS (sore throat, sores in mouth, or a toothache) UNUSUAL RASH, SWELLING OR PAIN  UNUSUAL VAGINAL DISCHARGE OR ITCHING   Items with * indicate a potential emergency and should be followed up as soon as possible or go to the Emergency Department if any problems should occur.  Please show the CHEMOTHERAPY ALERT CARD or  IMMUNOTHERAPY ALERT CARD at check-in to the Emergency Department and triage nurse.  Should you have questions after your visit or need to cancel or reschedule your appointment, please contact [facility] - A DEPT OF [facility]  Dept: [phone]  and follow the prompts.  Office hours are 8:00 a.m. to 4:30 p.m. Monday - Friday. Please note that voicemails left after 4:00 p.m. may not be returned until the following business day.  We are closed weekends and major holidays. You have access to a nurse at all times for urgent questions. Please call the main number to the clinic Dept: [phone] and follow the prompts.   For any non-urgent questions, you may also contact your provider using MyChart. We now offer e-Visits for anyone 80 and older to request care online for non-urgent symptoms. For details visit [email or web address].   Also download the MyChart app! Go to the app store, search "MyChart", open the app, select [facility], and log in with your MyChart username and password.  Docetaxel Injection What is this medication? DOCETAXEL (doe se TAX el) treats some types of cancer. It works by slowing down the growth of cancer cells. This medicine may be used for other purposes; ask your health care provider or pharmacist if you have questions. COMMON BRAND NAME(S): BEIZRAY, Docefrez, Docivyx, Taxotere What should I tell my care team before I take this medication? They need to know if you have any of these conditions: Kidney disease Liver disease Low white blood cell levels Tingling of the fingers or toes or  other nerve disorder An unusual or allergic reaction to docetaxel, polysorbate 80, other medications, foods, dyes, or preservatives Pregnant or trying to get pregnant Breast-feeding How should I use this medication? This medication is injected into a vein. It is given by your care team in a hospital or clinic setting. Talk to your care team about the  use of this medication in children. Special care may be needed. Overdosage: If you think you have taken too much of this medicine contact a poison control center or emergency room at once. NOTE: This medicine is only for you. Do not share this medicine with others. What if I miss a dose? Keep appointments for follow-up doses. It is important not to miss your dose. Call your care team if you are unable to keep an appointment. What may interact with this medication? Do not take this medication with any of the following: Live virus vaccines This medication may also interact with the following: Certain antibiotics, such as clarithromycin, telithromycin Certain antivirals for HIV or hepatitis Certain medications for fungal infections, such as itraconazole, ketoconazole, voriconazole Grapefruit juice Nefazodone Supplements, such as St. John's wort This list may not describe all possible interactions. Give your health care provider a list of all the medicines, herbs, non-prescription drugs, or dietary supplements you use. Also tell them if you smoke, drink alcohol, or use illegal drugs. Some items may interact with your medicine. What should I watch for while using this medication? This medication may make you feel generally unwell. This is not uncommon as chemotherapy can affect healthy cells as well as cancer cells. Report any side effects. Continue your course of treatment even though you feel ill unless your care team tells you to stop. You may need blood work done while you are taking this medication. This medication can cause serious side effects and infusion reactions. To reduce the risk, your care team may give you other medications to take before receiving this one. Be sure to follow the directions from your care team. This medication may increase your risk of getting an infection. Call your care team for advice if you get a fever, chills, sore throat, or other symptoms of a cold or flu. Do not  treat yourself. Try to avoid being around people who are sick. Avoid taking medications that contain aspirin, acetaminophen, ibuprofen, naproxen, or ketoprofen unless instructed by your care team. These medications may hide a fever. Be careful brushing or flossing your teeth or using a toothpick because you may get an infection or bleed more easily. If you have any dental work done, tell your dentist you are receiving this medication. Some products may contain alcohol. Ask your care team if this medication contains alcohol. Be sure to tell all care teams you are taking this medicine. Certain medications, like metronidazole and disulfiram, can cause an unpleasant reaction when taken with alcohol. The reaction includes flushing, headache, nausea, vomiting, sweating, and increased thirst. The reaction can last from 30 minutes to several hours. This medication may affect your coordination, reaction time, or judgement. Do not drive or operate machinery until you know how this medication affects you. Sit up or stand slowly to reduce the risk of dizzy or fainting spells. Drinking alcohol with this medication can increase the risk of these side effects. Talk to your care team about your risk of cancer. You may be more at risk for certain types of cancer if you take this medication. Talk to your care team if you wish to become pregnant  or think you might be pregnant. This medication can cause serious birth defects if taken during pregnancy or if you get pregnant within 2 months after stopping therapy. A negative pregnancy test is required before starting this medication. A reliable form of contraception is recommended while taking this medication and for 2 months after stopping it. Talk to your care team about reliable forms of contraception. Do not breast-feed while taking this medication and for 1 week after stopping therapy. Use a condom during sex and for 4 months after stopping therapy. Tell your care team right  away if you think your partner might be pregnant. This medication can cause serious birth defects. This medication may cause infertility. Talk to your care team if you are concerned about your fertility. What side effects may I notice from receiving this medication? Side effects that you should report to your care team as soon as possible: Allergic reactions--skin rash, itching, hives, swelling of the face, lips, tongue, or throat Change in vision such as blurry vision, seeing halos around lights, vision loss Infection--fever, chills, cough, or sore throat Infusion reactions--chest pain, shortness of breath or trouble breathing, feeling faint or lightheaded Low red blood cell level--unusual weakness or fatigue, dizziness, headache, trouble breathing Pain, tingling, or numbness in the hands or feet Painful swelling, warmth, or redness of the skin, blisters or sores at the infusion site Redness, blistering, peeling, or loosening of the skin, including inside the mouth Sudden or severe stomach pain, bloody diarrhea, fever, nausea, vomiting Swelling of the ankles, hands, or feet Tumor lysis syndrome (TLS)--nausea, vomiting, diarrhea, decrease in the amount of urine, dark urine, unusual weakness or fatigue, confusion, muscle pain or cramps, fast or irregular heartbeat, joint pain Unusual bruising or bleeding Side effects that usually do not require medical attention (report to your care team if they continue or are bothersome): Change in nail shape, thickness, or color Change in taste Hair loss Increased tears This list may not describe all possible side effects. Call your doctor for medical advice about side effects. You may report side effects to FDA at 1-800-FDA-1088. Where should I keep my medication? This medication is given in a hospital or clinic. It will not be stored at home. NOTE: This sheet is a summary. It may not cover all possible information. If you have questions about this  medicine, talk to your doctor, pharmacist, or health care provider.  2024 Elsevier/Gold Standard (2021-05-03 00:00:00)

## 2023-05-15 ENCOUNTER — Telehealth: Payer: Self-pay | Admitting: *Deleted

## 2023-05-15 ENCOUNTER — Telehealth: Payer: Self-pay

## 2023-05-15 NOTE — Telephone Encounter (Signed)
-----   Message from Nurse Currie Paris sent at 05/14/2023 11:34 AM EST ----- Regarding: First time Docetaxel, pt of Dr Cherly Hensen First time Docetaxel. Pt of Dr Cherly Hensen. Tolerated infusion well. No adverse s/s. Please call to check in with patient when possible - thanks!

## 2023-05-15 NOTE — Telephone Encounter (Signed)
 Notified of message below

## 2023-05-15 NOTE — Telephone Encounter (Signed)
 Mr. Johnny Davenport states that he is doing fine. He is eating, drinking, and urinating well. He knows to call the office at [phone] if he has any questions or concerns.

## 2023-05-15 NOTE — Telephone Encounter (Signed)
-----   Message from Melven Sartorius sent at 05/15/2023 10:17 AM EST ----- Please let him know PSA continue to trend down.  Good news.  Thank you

## 2023-05-16 ENCOUNTER — Inpatient Hospital Stay: Payer: Medicare Other

## 2023-05-16 VITALS — BP 135/78 | HR 77 | Temp 98.6°F | Resp 16

## 2023-05-16 DIAGNOSIS — Z5111 Encounter for antineoplastic chemotherapy: Secondary | ICD-10-CM | POA: Diagnosis not present

## 2023-05-16 DIAGNOSIS — C7800 Secondary malignant neoplasm of unspecified lung: Secondary | ICD-10-CM | POA: Diagnosis not present

## 2023-05-16 DIAGNOSIS — Z8546 Personal history of malignant neoplasm of prostate: Secondary | ICD-10-CM

## 2023-05-16 DIAGNOSIS — Z79899 Other long term (current) drug therapy: Secondary | ICD-10-CM | POA: Diagnosis not present

## 2023-05-16 DIAGNOSIS — C61 Malignant neoplasm of prostate: Secondary | ICD-10-CM | POA: Diagnosis not present

## 2023-05-16 DIAGNOSIS — K1230 Oral mucositis (ulcerative), unspecified: Secondary | ICD-10-CM | POA: Diagnosis not present

## 2023-05-16 DIAGNOSIS — H6692 Otitis media, unspecified, left ear: Secondary | ICD-10-CM | POA: Diagnosis not present

## 2023-05-16 DIAGNOSIS — C7951 Secondary malignant neoplasm of bone: Secondary | ICD-10-CM | POA: Diagnosis not present

## 2023-05-16 DIAGNOSIS — D6481 Anemia due to antineoplastic chemotherapy: Secondary | ICD-10-CM | POA: Diagnosis not present

## 2023-05-16 MED ORDER — PEGFILGRASTIM-CBQV 6 MG/0.6ML ~~LOC~~ SOSY
6.0000 mg | PREFILLED_SYRINGE | Freq: Once | SUBCUTANEOUS | Status: AC
  Administered 2023-05-16: 6 mg via SUBCUTANEOUS
  Filled 2023-05-16: qty 0.6

## 2023-05-20 ENCOUNTER — Other Ambulatory Visit (HOSPITAL_COMMUNITY): Payer: Self-pay

## 2023-05-20 ENCOUNTER — Telehealth: Payer: Self-pay | Admitting: Genetic Counselor

## 2023-05-20 ENCOUNTER — Other Ambulatory Visit: Payer: Self-pay

## 2023-05-20 ENCOUNTER — Other Ambulatory Visit: Payer: Self-pay | Admitting: Genetic Counselor

## 2023-05-20 DIAGNOSIS — C61 Malignant neoplasm of prostate: Secondary | ICD-10-CM

## 2023-05-20 NOTE — Progress Notes (Signed)
 Specialty Pharmacy Ongoing Clinical Assessment Note  Johnny Davenport is a 73 y.o. male who is being followed by the specialty pharmacy service for RxSp Oncology   Patient's specialty medication(s) reviewed today: Darolutamide Merleen Nicely); Relugolix (ORGOVYX)   Missed doses in the last 4 weeks: 0   Patient/Caregiver did not have any additional questions or concerns.   Therapeutic benefit summary: Patient is achieving benefit   Adverse events/side effects summary: Experienced adverse events/side effects (mild fatigue, tolerable)   Patient's therapy is appropriate to: Continue    Goals Addressed             This Visit's Progress    Maintain optimal adherence to therapy   On track    Patient is on track. Patient will maintain adherence         Follow up:  3 months  Servando Snare Specialty Pharmacist

## 2023-05-20 NOTE — Telephone Encounter (Signed)
 Received request from Cherlyn Cushing RN to draw blood for genetic testing on 3/25 with labs for treatment, prior to genetic counseling appointment 3/28. Orders placed for [facility], will hold sample until patient completes pre-test counseling appointment on 06/06/23.

## 2023-05-20 NOTE — Progress Notes (Signed)
 Specialty Pharmacy Refill Coordination Note  Johnny Davenport is a 73 y.o. male contacted today regarding refills of specialty medication(s) No data recorded  Patient requested (Patient-Rptd) Delivery   Delivery date: (Patient-Rptd) 06/05/23   Verified address: (Patient-Rptd) [address] [place], [place] [address]   Medication will be filled on 03.26.25.   - lvm for patient rph question on questionnaire response//new therapy check

## 2023-05-21 ENCOUNTER — Telehealth: Payer: Self-pay

## 2023-05-21 NOTE — Telephone Encounter (Signed)
 TC from pt's wife, reporting that pt has a painful "canker sore" in his mouth. She is wondering if it is okay for him to use Anbesol gel to help w/ this and/or if there is something else he could use. Advised that it is okay to use Anbesol or similar OTC medication, and if this does not help or if he develops additional sores, let Dr. Cherly Hensen know. Explained that there are other Rx medications that can help with mouth sores if this becomes a problem during his treatment. She verbalizes understanding.

## 2023-05-23 ENCOUNTER — Other Ambulatory Visit: Payer: Self-pay

## 2023-05-23 MED ORDER — MAGIC MOUTHWASH W/LIDOCAINE: 5.0000 mL | Freq: Three times a day (TID) | ORAL | 0 refills | Status: DC | PRN

## 2023-05-23 NOTE — Progress Notes (Signed)
 RN spoke with patient regarding increase in mouth discomfort due to mouth sore.   Patient is s/p D1C1 Taxotere.    RN spoke with Dr. Cherly Hensen, orders obtained for [facility].  Rx sent to pharmacy.  Patient aware.

## 2023-05-27 ENCOUNTER — Telehealth: Payer: Self-pay

## 2023-05-27 ENCOUNTER — Ambulatory Visit
Admission: RE | Admit: 2023-05-27 | Discharge: 2023-05-27 | Disposition: A | Discharge status: Home / Self Care | Payer: Medicare Other | Source: Ambulatory Visit | Attending: Adult Health | Admitting: Adult Health

## 2023-05-27 ENCOUNTER — Other Ambulatory Visit: Payer: Self-pay

## 2023-05-27 MED ORDER — AMOXICILLIN-POT CLAVULANATE 875-125 MG PO TABS: 1.0000 | ORAL_TABLET | Freq: Two times a day (BID) | ORAL | 0 refills | Status: AC

## 2023-05-27 NOTE — Progress Notes (Signed)
  Radiation Oncology         [phone]) [phone] ________________________________  Name: Johnny Davenport MRN: [id number]  Date of Service: 05/27/2023  DOB: 07-28-1950  Post Treatment Telephone Note  Diagnosis:  C61 Malignant neoplasm of prostate (as documented in provider EOT note)  The patient was available for call today.  The patient did note fatigue during radiation. The patient did not note skin changes in the field of radiation during therapy. The patient has noticed improvement in pain in the area(s) treated with radiation. The patient is taking dexamethasone. The patient does not have symptoms of  weakness or loss of control of the extremities. The patient does not have symptoms of headache. The patient does not have symptoms of seizure or uncontrolled movement. The patient does not have symptoms of changes in vision. The patient does not have changes in speech. The patient does not have confusion.   The patient is scheduled for ongoing care with Dr. Cherly Hensen in medical oncology. The patient was encouraged to call if he  develops concerns or questions regarding radiation.   This concludes the interaction.  Ruel Favors, LPN

## 2023-05-27 NOTE — Progress Notes (Signed)
 Augmentin twice daily x 10 days sent to pharmacy per patient request for acute ear infection.

## 2023-05-27 NOTE — Telephone Encounter (Signed)
-----   Message from Melven Sartorius sent at 05/27/2023  9:31 AM EDT ----- Regarding: RE: Pt complaint I sent augmentin to his pharmacy. Thanks. ----- Message ----- From: Yehuda Budd, RN Sent: 05/27/2023   9:07 AM EDT To: Melven Sartorius, MD Subject: Pt complaint                                   Mr. Solanki left a VM, saying he thinks he has an ear/sinus infection and wants to know if he can take an antibiotic. He says he gets one every year this time due to the pollen. Do you want me to tell him to call his PCP?

## 2023-05-27 NOTE — Telephone Encounter (Signed)
 TC to pt to inform that Dr. Cherly Hensen submitted Augmentin Rx to his [facility] pharmacy. He verbalizes understanding.

## 2023-05-28 ENCOUNTER — Encounter: Payer: Self-pay | Admitting: Family Medicine

## 2023-05-28 DIAGNOSIS — C61 Malignant neoplasm of prostate: Secondary | ICD-10-CM

## 2023-05-28 NOTE — Progress Notes (Unsigned)
 [facility] [address] [place], [place] [address] [phone]  Date:  05/29/2023   Name:  Johnny Davenport   DOB:  [birth year]   MRN:  [id number]  PCP:  Pearline Cables, MD    Chief Complaint: Recurrent Otitis (X 5 days ago/Concerns/ questions: canker sore on tongue x 5 days ago, taking Augmentin)   History of Present Illness:  Johnny Davenport is a 73 y.o. very pleasant male patient who presents with the following:  Patient seen today with a concern about his ear He reached out to me yesterday as follows: Hey Dr. Patsy Lager, I just want to update you on my progress. I did 10 days of radiation and had my first of 6 Chemo infusions on 3/5. My PSA has dropped substantially - from 107 (no kidding) to .9! Side effects from Chemo have been tolerable with 1 exception. I have an ear infection and canker sore on my tongue. Both are painful and I just began taking Augmentin for that. Question - should I consider getting my ear drained to relieve the pressure and can you do that? I did not run this by the Oncologist yet but wanted your thought.       Also - I have been walking 3-4 miles per day for nearly 1 month. Thanks for your help  Johnny Davenport had brachytherapy for stage II prostate cancer in June 2024.  Unfortunately he was then diagnosed with metastatic prostate cancer this past January  He notes he tolerated radiation and it helped a lot with his pain He noted his left ear hurting and a canker sore on his left tongue 5-6 days ago- it hurts to eat, speak He has tried putting ambusol on it, peroxide rinse, ice His ear feels clogged He has gotten a bit of drainage from the left ear- his oncologist put him on augmentin 2 days ago, he has taken 5 doses so far Hearing is a bit muffled The ear is not painful  Patient Active Problem List   Diagnosis Date Noted   At risk for side effect of medication 04/11/2023   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) 04/09/2023   History of prostate cancer 05/28/2022   Prediabetes 05/13/2022   Aneurysm of thoracic aorta (HCC) 10/21/2021   Lateral epicondylitis, left elbow 11/16/2020   Arthritis of carpometacarpal [facility]) joint of right thumb 11/16/2020   Dyslipidemia 09/30/2020    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   Wears glasses     Past Surgical History:  Procedure Laterality Date   COLONOSCOPY  2021   TA/hems-5 yr recall   Hand/Finger surgery  1997   KNEE ARTHROSCOPY W/ MENISCAL REPAIR Right    x 2   POLYPECTOMY  2016   TA   PROSTATE BIOPSY     RADIOACTIVE SEED IMPLANT N/A 08/13/2022   Procedure: RADIOACTIVE SEED IMPLANT/BRACHYTHERAPY IMPLANT;  Surgeon: Belva Agee, MD;  Location: [facility] [place];  Service: Urology;  Laterality: N/A;  90 MINS   ROTATOR CUFF REPAIR Left    SPACE OAR INSTILLATION N/A 08/13/2022   Procedure: SPACE OAR INSTILLATION;  Surgeon: Belva Agee, MD;  Location: [facility];  Service: Urology;  Laterality: N/A;   TONSILLECTOMY     WISDOM TOOTH EXTRACTION      Social History   Tobacco Use   Smoking status: Never   Smokeless tobacco: Never  Vaping Use   Vaping status: Never Used  Substance Use Topics   Alcohol use: Not Currently    Alcohol/week: 1.0 standard drink of alcohol    Types: 1 Standard drinks or equivalent per week   Drug use: No    Family History  Problem Relation Age of Onset   Stroke Mother 1   Healthy Sister    Healthy Sister    Colon cancer Neg Hx    Colon polyps Neg Hx    Esophageal  cancer Neg Hx    Stomach cancer Neg Hx    Rectal cancer Neg Hx     Allergies  Allergen Reactions   Other Other (See Comments) and Cough    Pollen From Flowers    Medication list has been reviewed and updated.  Current Outpatient Medications on File Prior to Visit  Medication Sig Dispense Refill   amoxicillin-clavulanate (AUGMENTIN) 875-125 MG tablet Take 1 tablet by mouth every 12 (twelve) hours for 10 days. 20 tablet 0   darolutamide (NUBEQA) 300 MG tablet Take 2 tablets (600 mg total) by mouth 2 (two) times daily with a meal. 120 tablet 11   dexamethasone (DECADRON) 4 MG tablet Take 2 tabs by mouth 2 times daily starting day before chemo. Then take 2 tabs daily for 2 days starting day after chemo. Take with food. 30 tablet 1   magic mouthwash w/lidocaine SOLN Take 5 mLs by mouth 3 (three) times daily as needed for mouth pain. Suspension contains equal amounts of Maalox Extra Strength, nystatin, diphenhydramine and lidocaine. 120 mL 0   omeprazole (PRILOSEC) 20 MG capsule Take 1 capsule (20 mg total) by mouth daily. 90 capsule 3   ondansetron (ZOFRAN) 8 MG tablet Take 1 tablet (8 mg total) by mouth every 8 (eight) hours as needed for nausea or vomiting. 30 tablet 1   prochlorperazine (COMPAZINE) 10 MG tablet Take 1 tablet (10 mg total) by mouth every 6 (six) hours as needed for nausea or vomiting (take twice daily on days 1 to 3 of chemotherapy, then follow as needed instruction if feeling fine by day 4). 30 tablet 1   relugolix (ORGOVYX) 120 MG tablet Take 1 tablet (120 mg total) by mouth daily. 30 tablet 11   SUMAtriptan (IMITREX) 50 MG tablet Take 1 tablet (50 mg total) by mouth every 2 (two) hours as needed for migraine. Max 100 mg in 24 hours 10 tablet 6   No current facility-administered medications on file prior to visit.    Review of Systems:  As per HPI- otherwise negative.    Physical Examination: Vitals:   05/29/23 1332  BP: 122/62  Pulse: 86  Resp: 18  Temp: 98.4  F (36.9 C)  SpO2: 97%   Vitals:   05/29/23 1332  Weight: 183 lb 3.2 oz (83.1 kg)  Height: 6' (1.829 m)   Body mass index is 24.85 kg/m. Ideal Body Weight: Weight in (lb) to have BMI = 25: 183.9  GEN: no acute distress.  Looks well  HEENT: Atraumatic, Normocephalic.  Canker  sore left side of tongue  No other oral lesions. Ears are normal Ears and Nose: No external deformity. CV: RRR, No M/G/R. No JVD. No thrill. No extra heart sounds. PULM: CTA B, no wheezes, crackles, rhonchi. No retractions. No resp. distress. No accessory muscle use. ABD: S, NT, ND, +BS. No rebound. No HSM. EXTR: No c/c/e PSYCH: Normally interactive. Conversant.   Wt Readings from Last 3 Encounters:  05/29/23 183 lb 3.2 oz (83.1 kg)  05/14/23 183 lb (83 kg)  05/13/23 182 lb 12.8 oz (82.9 kg)     Assessment and Plan: Canker sore - Plan: clobetasol (TEMOVATE) 0.05 % GEL, DISCONTINUED: clobetasol ointment (TEMOVATE) 0.05 %, DISCONTINUED: clobetasol ointment (TEMOVATE) 0.05 % Pt with painful canker sore Rx for clobetasol to apply up to TID He will let me know if not improving soon- Sooner if worse.     Signed Abbe Amsterdam, MD

## 2023-05-29 ENCOUNTER — Other Ambulatory Visit (HOSPITAL_COMMUNITY): Payer: Self-pay

## 2023-05-29 ENCOUNTER — Other Ambulatory Visit (HOSPITAL_BASED_OUTPATIENT_CLINIC_OR_DEPARTMENT_OTHER): Payer: Self-pay

## 2023-05-29 ENCOUNTER — Other Ambulatory Visit: Payer: Self-pay

## 2023-05-29 ENCOUNTER — Ambulatory Visit (INDEPENDENT_AMBULATORY_CARE_PROVIDER_SITE_OTHER): Admitting: Family Medicine

## 2023-05-29 VITALS — BP 122/62 | HR 86 | Temp 98.4°F | Resp 18 | Ht 72.0 in | Wt 183.2 lb

## 2023-05-29 DIAGNOSIS — K120 Recurrent oral aphthae: Secondary | ICD-10-CM

## 2023-05-29 MED ORDER — CLOBETASOL PROPIONATE 0.05 % EX GEL
CUTANEOUS | 0 refills | Status: DC
  Filled 2023-05-29 (×2): qty 15, 15d supply, fill #0

## 2023-05-29 MED ORDER — CLOBETASOL PROPIONATE 0.05 % EX OINT
TOPICAL_OINTMENT | CUTANEOUS | 0 refills | Status: DC
  Filled 2023-05-29: qty 15, 30d supply, fill #0

## 2023-05-29 MED ORDER — CLOBETASOL PROPIONATE 0.05 % EX OINT: TOPICAL_OINTMENT | CUTANEOUS | 0 refills | Status: DC

## 2023-05-29 NOTE — Patient Instructions (Addendum)
 It was good to see you today- I am sorry you have this painful canker sore!   Plan- we will try and get this healed with a steroid ointment.  Dry the sore with a tissue or cloth, and then apply a thin layer of the steroid cream. You can cover this with Orabase or a similar product to keep the gel in place and relieve pain  Please let me know if you are not improving in the next 2 or 3 days!

## 2023-06-02 ENCOUNTER — Telehealth: Payer: Self-pay

## 2023-06-02 DIAGNOSIS — J019 Acute sinusitis, unspecified: Secondary | ICD-10-CM | POA: Diagnosis not present

## 2023-06-02 DIAGNOSIS — R29898 Other symptoms and signs involving the musculoskeletal system: Secondary | ICD-10-CM | POA: Diagnosis not present

## 2023-06-02 DIAGNOSIS — H6993 Unspecified Eustachian tube disorder, bilateral: Secondary | ICD-10-CM | POA: Diagnosis not present

## 2023-06-02 NOTE — Telephone Encounter (Signed)
 TC from pt, reporting that he cannot hear out of his L ear. Dr. Cherly Hensen called in antibiotics for pt on 3/18 for sinus/ear infection, which pt has not yet completed. He has appt w/ Dr. Cherly Hensen tomorrow. Informed that Dr. Cherly Hensen may be able to examine his ear tomorrow, but this is typically a problem that needs to be addressed w/ his PCP. He reports that he will call his ENT.

## 2023-06-03 ENCOUNTER — Inpatient Hospital Stay: Admitting: Dietician

## 2023-06-03 ENCOUNTER — Inpatient Hospital Stay (HOSPITAL_BASED_OUTPATIENT_CLINIC_OR_DEPARTMENT_OTHER): Payer: Medicare Other

## 2023-06-03 ENCOUNTER — Inpatient Hospital Stay: Payer: Medicare Other

## 2023-06-03 ENCOUNTER — Telehealth: Payer: Self-pay

## 2023-06-03 VITALS — BP 120/65 | HR 73 | Temp 97.7°F | Resp 17 | Ht 72.0 in | Wt 182.4 lb

## 2023-06-03 DIAGNOSIS — C7800 Secondary malignant neoplasm of unspecified lung: Secondary | ICD-10-CM | POA: Diagnosis not present

## 2023-06-03 DIAGNOSIS — J302 Other seasonal allergic rhinitis: Secondary | ICD-10-CM

## 2023-06-03 DIAGNOSIS — D6481 Anemia due to antineoplastic chemotherapy: Secondary | ICD-10-CM | POA: Diagnosis not present

## 2023-06-03 DIAGNOSIS — H6692 Otitis media, unspecified, left ear: Secondary | ICD-10-CM | POA: Diagnosis not present

## 2023-06-03 DIAGNOSIS — K1230 Oral mucositis (ulcerative), unspecified: Secondary | ICD-10-CM | POA: Diagnosis not present

## 2023-06-03 DIAGNOSIS — Z8546 Personal history of malignant neoplasm of prostate: Secondary | ICD-10-CM

## 2023-06-03 DIAGNOSIS — Z5111 Encounter for antineoplastic chemotherapy: Secondary | ICD-10-CM | POA: Diagnosis not present

## 2023-06-03 DIAGNOSIS — C7951 Secondary malignant neoplasm of bone: Secondary | ICD-10-CM | POA: Diagnosis not present

## 2023-06-03 DIAGNOSIS — C61 Malignant neoplasm of prostate: Secondary | ICD-10-CM

## 2023-06-03 DIAGNOSIS — H6690 Otitis media, unspecified, unspecified ear: Secondary | ICD-10-CM | POA: Insufficient documentation

## 2023-06-03 DIAGNOSIS — Z9189 Other specified personal risk factors, not elsewhere classified: Secondary | ICD-10-CM

## 2023-06-03 DIAGNOSIS — Z79899 Other long term (current) drug therapy: Secondary | ICD-10-CM | POA: Diagnosis not present

## 2023-06-03 LAB — CBC WITH DIFFERENTIAL (CANCER CENTER ONLY)
Abs Immature Granulocytes: 0.06 10*3/uL (ref 0.00–0.07)
Basophils Absolute: 0 10*3/uL (ref 0.0–0.1)
Basophils Relative: 0 %
Eosinophils Absolute: 0 10*3/uL (ref 0.0–0.5)
Eosinophils Relative: 0 %
HCT: 33.1 % — ABNORMAL LOW (ref 39.0–52.0)
Hemoglobin: 11.1 g/dL — ABNORMAL LOW (ref 13.0–17.0)
Immature Granulocytes: 1 %
Lymphocytes Relative: 4 %
Lymphs Abs: 0.5 10*3/uL — ABNORMAL LOW (ref 0.7–4.0)
MCH: 31.4 pg (ref 26.0–34.0)
MCHC: 33.5 g/dL (ref 30.0–36.0)
MCV: 93.8 fL (ref 80.0–100.0)
Monocytes Absolute: 1 10*3/uL (ref 0.1–1.0)
Monocytes Relative: 8 %
Neutro Abs: 10.3 10*3/uL — ABNORMAL HIGH (ref 1.7–7.7)
Neutrophils Relative %: 87 %
Platelet Count: 418 10*3/uL — ABNORMAL HIGH (ref 150–400)
RBC: 3.53 MIL/uL — ABNORMAL LOW (ref 4.22–5.81)
RDW: 15.2 % (ref 11.5–15.5)
WBC Count: 11.8 10*3/uL — ABNORMAL HIGH (ref 4.0–10.5)
nRBC: 0 % (ref 0.0–0.2)

## 2023-06-03 LAB — CMP (CANCER CENTER ONLY)
ALT: 15 U/L (ref 0–44)
AST: 17 U/L (ref 15–41)
Albumin: 3.9 g/dL (ref 3.5–5.0)
Alkaline Phosphatase: 143 U/L — ABNORMAL HIGH (ref 38–126)
Anion gap: 7 (ref 5–15)
BUN: 25 mg/dL — ABNORMAL HIGH (ref 8–23)
CO2: 26 mmol/L (ref 22–32)
Calcium: 9.3 mg/dL (ref 8.9–10.3)
Chloride: 104 mmol/L (ref 98–111)
Creatinine: 0.87 mg/dL (ref 0.61–1.24)
GFR, Estimated: >60 mL/min (ref >60)
Glucose, Bld: 110 mg/dL — ABNORMAL HIGH (ref 70–99)
Potassium: 4.2 mmol/L (ref 3.5–5.1)
Sodium: 137 mmol/L (ref 135–145)
Total Bilirubin: 0.4 mg/dL (ref 0.0–1.2)
Total Protein: 7 g/dL (ref 6.5–8.1)

## 2023-06-03 LAB — GENETIC SCREENING ORDER

## 2023-06-03 LAB — LACTATE DEHYDROGENASE: LDH: 150 U/L (ref 98–192)

## 2023-06-03 MED ORDER — SODIUM CHLORIDE 0.9 % IV SOLN
75.0000 mg/m2 | Freq: Once | INTRAVENOUS | Status: AC
  Administered 2023-06-03: 160 mg via INTRAVENOUS
  Filled 2023-06-03: qty 16

## 2023-06-03 MED ORDER — SODIUM CHLORIDE 0.9 % IV SOLN: INTRAVENOUS | Status: DC

## 2023-06-03 MED ORDER — DEXAMETHASONE SODIUM PHOSPHATE 10 MG/ML IJ SOLN
10.0000 mg | Freq: Once | INTRAMUSCULAR | Status: AC
  Administered 2023-06-03: 10 mg via INTRAVENOUS
  Filled 2023-06-03: qty 1

## 2023-06-03 NOTE — Progress Notes (Signed)
 Nutrition Follow-up:  Pt with prostate cancer metastatic to bone, lung, and lymph nodes. S/p brachytherapy (08/2022). Patient will completed palliative radiation under the care of Dr. Kathrynn Running on 2/14. Currently receiving chemotherapy with docetaxel q21d (first 3/5). Pt followed by Dr. Cherly Hensen  Met with pt in infusion. Wife and pt son present for visit. Pt reports tolerating therapy well overall. He did develop mouth sores which has affected types of foods pt is tolerating. Pt is using MMW for this. Pt reports appetite has been good. He is eating 3 meals daily. Yesterday recalls cream of wheat for breakfast, chicken noodle soup, chicken salad sandwich and a donut at lunch. Had scrambled eggs with pancakes for dinner. He is drinking 64 ounces of water. Denies nausea, vomiting, diarrhea. Mild constipation managed with stool softener as needed. Pt remains active. Walking 3 miles most days.    Medications: reviewed   Labs: glucose 110, BUN 25  Anthropometrics: Wt 182 lb 6 oz today - stable   3/4 - 182 lb 12.8 oz  2/17 - 180 lb    NUTRITION DIAGNOSIS: Food an nutrition related knowledge deficit improved    INTERVENTION:  Encourage soft moist high protein foods to support healing - handout with ideas provided Suggested baking soda salt water rinses to aid with healing - handout with recipe provided Continue activity as able     MONITORING, EVALUATION, GOAL: wt trends, intake   NEXT VISIT: Tuesday May 6 during infusion

## 2023-06-03 NOTE — Patient Instructions (Signed)
 [facility] - A DEPT OF [facility]  Discharge Instructions: Thank you for choosing [facility] to provide your oncology and hematology care.   If you have a lab appointment with the Cancer Center, please go directly to the Cancer Center and check in at the registration area.   Wear comfortable clothing and clothing appropriate for easy access to any Portacath or PICC line.   We strive to give you quality time with your provider. You may need to reschedule your appointment if you arrive late (15 or more minutes).  Arriving late affects you and other patients whose appointments are after yours.  Also, if you miss three or more appointments without notifying the office, you may be dismissed from the clinic at the provider's discretion.      For prescription refill requests, have your pharmacy contact our office and allow 72 hours for refills to be completed.    Today you received the following chemotherapy and/or immunotherapy agents: Docetaxel      To help prevent nausea and vomiting after your treatment, we encourage you to take your nausea medication as directed.  BELOW ARE SYMPTOMS THAT SHOULD BE REPORTED IMMEDIATELY: *FEVER GREATER THAN 100.4 F (38 C) OR HIGHER *CHILLS OR SWEATING *NAUSEA AND VOMITING THAT IS NOT CONTROLLED WITH YOUR NAUSEA MEDICATION *UNUSUAL SHORTNESS OF BREATH *UNUSUAL BRUISING OR BLEEDING *URINARY PROBLEMS (pain or burning when urinating, or frequent urination) *BOWEL PROBLEMS (unusual diarrhea, constipation, pain near the anus) TENDERNESS IN MOUTH AND THROAT WITH OR WITHOUT PRESENCE OF ULCERS (sore throat, sores in mouth, or a toothache) UNUSUAL RASH, SWELLING OR PAIN  UNUSUAL VAGINAL DISCHARGE OR ITCHING   Items with * indicate a potential emergency and should be followed up as soon as possible or go to the Emergency Department if any problems should occur.  Please show the CHEMOTHERAPY ALERT CARD or IMMUNOTHERAPY  ALERT CARD at check-in to the Emergency Department and triage nurse.  Should you have questions after your visit or need to cancel or reschedule your appointment, please contact [facility] - A DEPT OF [facility]  Dept: [phone]  and follow the prompts.  Office hours are 8:00 a.m. to 4:30 p.m. Monday - Friday. Please note that voicemails left after 4:00 p.m. may not be returned until the following business day.  We are closed weekends and major holidays. You have access to a nurse at all times for urgent questions. Please call the main number to the clinic Dept: [phone] and follow the prompts.   For any non-urgent questions, you may also contact your provider using MyChart. We now offer e-Visits for anyone 58 and older to request care online for non-urgent symptoms. For details visit [email or web address].   Also download the MyChart app! Go to the app store, search "MyChart", open the app, select , and log in with your MyChart username and password.`

## 2023-06-03 NOTE — Telephone Encounter (Signed)
 Oral Oncology Pharmacist Encounter  Received phone call from patients wife asking if her husband can take Flonase with his current medications as she wanted to make sure they do not interact. I notified patient over mychart that it is okay to take Flonase and that it does not interact with his current medications. Patient was appreciative.   Notified MD of conversation above.   Bethel Born, PharmD Hematology/Oncology Clinical Pharmacist [facility] [phone]

## 2023-06-03 NOTE — Assessment & Plan Note (Addendum)
 Supportive baseline bone mineral density study and then every 2 years calcium (1000-1200 mg daily from food and supplements) and vitamin D3 (1000 IU daily) Control and prevent diabetes Aggressive cardiovascular risk management Weight-bearing exercises (30 minutes per day) Limit alcohol consumption and avoid smoking

## 2023-06-03 NOTE — Assessment & Plan Note (Addendum)
 Magic mouthwash as needed Use clobetasol gel as needed

## 2023-06-03 NOTE — Assessment & Plan Note (Addendum)
 Cycle 2 of docetaxel We will follow-up PSA each cycle Continue Relugolix and darolutamide

## 2023-06-03 NOTE — Assessment & Plan Note (Addendum)
 Left ear. Helped with Augmentin  Anemia, secondary to chemotherapy Expect more fatigue next week Increase fluid Follow up with repeat labs each cycle

## 2023-06-03 NOTE — Progress Notes (Signed)
 [facility] OFFICE PROGRESS NOTE  Patient Care Team: Copland, Gwenlyn Found, MD as PCP - General (Family Medicine) Cherlyn Cushing, RN as Oncology Nurse Navigator Manny, Delbert Phenix., MD as Consulting Physician (Urology) Margaretmary Dys, MD as Consulting Physician (Radiation Oncology) Maryclare Labrador, RN as Registered Nurse Axel Filler, Larna Daughters, NP as Nurse Practitioner (Hematology and Oncology)  Johnny Davenport is a 73 y.o.male with history of BPH, arthritis, hyperlipidemia, prostate cancer [facility] recurrent prostate cancer.  Currently on active treatment with docetaxel triplet therapy.   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  Germline testing: drawn today Somatic testing: will request Tempus on liquid today Treatment: s/p brachytherapy in 08/2022  Current treatment: Triple therapy with docetaxel, Relugolix and darolutamide.  He gets monthly shipment of daro and relugolix to his house.   Assessment & Plan Prostate cancer metastatic to multiple sites [facility]) Cycle 2 of docetaxel We will follow-up PSA each cycle Continue Relugolix and darolutamide Ear infection Left ear. Helped with Augmentin  Anemia, secondary to chemotherapy Expect more fatigue next week Increase fluid Follow up with repeat labs each cycle Mucositis Magic mouthwash as needed Use clobetasol gel as needed At risk for side effect of medication Supportive baseline bone mineral density study and then every 2 years calcium (1000-1200 mg daily from food and supplements) and vitamin D3 (1000 IU daily) Control and prevent diabetes Aggressive cardiovascular risk management Weight-bearing exercises (30 minutes per day) Limit alcohol consumption and avoid smoking  Orders Placed This Encounter  Procedures   DG Bone Density    Standing Status:    Future    Expected Date:   06/17/2023    Expiration Date:   06/02/2024    Reason for Exam (SYMPTOM  OR DIAGNOSIS REQUIRED):   baseline bone density on ADT for prostate cancer    Preferred imaging [facility]?:   [facility]     Melven Sartorius, MD  INTERVAL HISTORY: he returns for treatment follow-up. No fever, chill, nausea, vomiting, diarrhea. Slight constipation resolved resolved with stool softener.  Complications related to previous cycle of chemotherapy included mucositis, , anemia, bone pain from GCSF.  Taking vit D and calcium.  Oncology History  History of prostate cancer  05/06/2022 Cancer Staging   Staging form: Prostate, AJCC 8th Edition - Clinical stage from 05/06/2022: Stage IIB (cT2, cN0, cM0, PSA: 7.3, Grade Group: 2) - Signed by Marcello Fennel, PA-C on 07/04/2022 Histopathologic type: Adenocarcinoma, NOS Stage prefix: Initial diagnosis Prostate specific antigen (PSA) range: Less than 10 Gleason primary pattern: 3 Gleason secondary pattern: 4 Gleason score: 7 Histologic grading system: 5 grade system Number of biopsy cores examined: 12 Number of biopsy cores positive: 4 Location of positive needle core biopsies: One side   05/28/2022 Initial Diagnosis   Malignant neoplasm of prostate   03/27/2023 Imaging   MRI L Hip Heterogeneous osseous metastasis involving the pelvis involving the L4 and L5 vertebral bodies Probable extraosseous extension and small pathologic fracture of left ischial tuberosity with cortical disruption laterally.  Moderate sized partial-thickness tear of the adjacent left hamstring tendon origin Bone marrow edema surrounding the left SI joint with mild edema type signal along the superior margin of the left piriformis muscle.  Finding could be secondary to extraosseous extension of metastasis or represent posttraumatic change Small partial-thickness tear in the left gluteus muscle minimus tendon  at the insertion on the greater trochanter with  associated reactive bone marrow edema in the superior greater trochanter. Small low signal intensity focus along the insertional fibers of the left gluteus tendons which could represent calcium hydroxyapatite deposit Mild osteoarthritis of both. Possible enlarged left internal iliac/perirectal lymph node measuring 1.8 cm   04/08/2023 PET scan   PSMA PET 1. Extensive radiotracer avid skeletal metastasis involving the axillary and appendicular skeleton. 2. Extensive radiotracer avid lymph node metastasis in the pelvis, retroperitoneum, and mediastinum. 3. Multiple radiotracer avid small pulmonary metastasis.   04/10/2023 Tumor Marker   PSA 107   05/14/2023 -  Chemotherapy   Patient is on Treatment Plan : PROSTATE Docetaxel (73) q21d     Prostate cancer metastatic to multiple sites (HCC)  04/08/2023 PET scan   PSMA PET 1. Extensive radiotracer avid skeletal metastasis involving the axillary and appendicular skeleton. 2. Extensive radiotracer avid lymph node metastasis in the pelvis, retroperitoneum, and mediastinum. 3. Multiple radiotracer avid small pulmonary metastasis.   04/09/2023 Initial Diagnosis   Prostate cancer metastatic to multiple sites [facility])   04/10/2023 Tumor Marker   PSA 107   04/10/2023 Cancer Staging   Staging form: Prostate, AJCC 8th Edition - Clinical stage from 04/10/2023: Stage IVB (rcT0, cN1, cM1c, PSA: 107, Grade Group: 2) - Signed by Margaretmary Dys, MD on 04/11/2023 Stage prefix: Recurrence Prostate specific antigen (PSA) range: 20 or greater Gleason score: 7 Histologic grading system: 5 grade system      PHYSICAL EXAMINATION: ECOG PERFORMANCE STATUS: 0 - Asymptomatic  Vitals:   06/03/23 0857  BP: 120/65  Pulse: 73  Resp: 17  Temp: 97.7 F (36.5 C)  SpO2: 98%   Filed Weights   06/03/23 0857  Weight: 182 lb 6 oz (82.7 kg)   Gen: no distress. Lung: Clear lung sounds bilaterally Ext: no edema  Relevant data reviewed during this visit included  lab.

## 2023-06-04 ENCOUNTER — Other Ambulatory Visit: Payer: Self-pay

## 2023-06-04 LAB — PROSTATE-SPECIFIC AG, SERUM (LABCORP): Prostate Specific Ag, Serum: 0.3 ng/mL (ref 0.0–4.0)

## 2023-06-04 NOTE — Addendum Note (Signed)
 Addended by: Abbe Amsterdam C on: 06/04/2023 04:01 PM   Modules accepted: Orders

## 2023-06-05 ENCOUNTER — Inpatient Hospital Stay: Payer: Medicare Other

## 2023-06-05 VITALS — BP 115/70 | HR 74 | Resp 18

## 2023-06-05 DIAGNOSIS — K1230 Oral mucositis (ulcerative), unspecified: Secondary | ICD-10-CM | POA: Diagnosis not present

## 2023-06-05 DIAGNOSIS — H6692 Otitis media, unspecified, left ear: Secondary | ICD-10-CM | POA: Diagnosis not present

## 2023-06-05 DIAGNOSIS — C61 Malignant neoplasm of prostate: Secondary | ICD-10-CM | POA: Diagnosis not present

## 2023-06-05 DIAGNOSIS — Z8546 Personal history of malignant neoplasm of prostate: Secondary | ICD-10-CM

## 2023-06-05 DIAGNOSIS — C7951 Secondary malignant neoplasm of bone: Secondary | ICD-10-CM | POA: Diagnosis not present

## 2023-06-05 DIAGNOSIS — C7800 Secondary malignant neoplasm of unspecified lung: Secondary | ICD-10-CM | POA: Diagnosis not present

## 2023-06-05 DIAGNOSIS — Z79899 Other long term (current) drug therapy: Secondary | ICD-10-CM | POA: Diagnosis not present

## 2023-06-05 DIAGNOSIS — D6481 Anemia due to antineoplastic chemotherapy: Secondary | ICD-10-CM | POA: Diagnosis not present

## 2023-06-05 DIAGNOSIS — Z5111 Encounter for antineoplastic chemotherapy: Secondary | ICD-10-CM | POA: Diagnosis not present

## 2023-06-05 MED ORDER — PEGFILGRASTIM-CBQV 6 MG/0.6ML ~~LOC~~ SOSY
6.0000 mg | PREFILLED_SYRINGE | Freq: Once | SUBCUTANEOUS | Status: AC
  Administered 2023-06-05: 6 mg via SUBCUTANEOUS
  Filled 2023-06-05: qty 0.6

## 2023-06-06 ENCOUNTER — Other Ambulatory Visit: Payer: Medicare Other

## 2023-06-06 ENCOUNTER — Encounter: Payer: Self-pay | Admitting: Genetic Counselor

## 2023-06-06 ENCOUNTER — Inpatient Hospital Stay (HOSPITAL_BASED_OUTPATIENT_CLINIC_OR_DEPARTMENT_OTHER): Payer: Medicare Other | Admitting: Genetic Counselor

## 2023-06-06 DIAGNOSIS — C61 Malignant neoplasm of prostate: Secondary | ICD-10-CM | POA: Diagnosis not present

## 2023-06-06 DIAGNOSIS — Z803 Family history of malignant neoplasm of breast: Secondary | ICD-10-CM

## 2023-06-06 NOTE — Progress Notes (Signed)
 REFERRING PROVIDER: Melven Sartorius, MD [address] [place],  [place] [address]  PRIMARY PROVIDER:  Pearline Cables, MD  PRIMARY REASON FOR VISIT:  1. Prostate cancer metastatic to multiple sites [facility])   2. Family history of malignant neoplasm of breast     HISTORY OF PRESENT ILLNESS:   Johnny Davenport, a 73 y.o. male, was seen for a [place] cancer genetics consultation at the request of Dr. Cherly Hensen due to a personal and family history of cancer.  Johnny Davenport presents to clinic today to discuss the possibility of a hereditary predisposition to cancer, genetic testing, and to further clarify his future cancer risks, as well as potential cancer risks for family members.   In 2024, at the age of 21, Johnny Davenport was diagnosed with prostate cancer stage IIB s/p brachytherapy 08/2022. Metastases to bone, lung and lymph nodes identified 03/2023, current treated with triple therapy docetaxel, Relugolix and darolutamide.   Johnny Davenport does report limited previous genetic testing through [facility]. This included analysis of APC, ATM, BAP1, BARD1, BMPR1A, BRCA1, BRCA2, BRIP1, CDH1, CDK4, CDKN2A (p14ARF), CDKN2A (p16INK4a), CHEK2, EPCAM, GREM1, MITF, MLH1, MSH2, MSH6, MUTYH, PALB2, PMS2, POLD1, POLE, PTEN, RAD51C, RAD51D, SMAD4, STK11, TP53. Testing did not identify any mutations known to increase the risk for cancer, but did not include all genes recommended for prostate cancer (HOXB13).    CANCER HISTORY:  Oncology History  History of prostate cancer  05/06/2022 Cancer Staging   Staging form: Prostate, AJCC 8th Edition - Clinical stage from 05/06/2022: Stage IIB (cT2, cN0, cM0, PSA: 7.3, Grade Group: 2) - Signed by Marcello Fennel, PA-C on 07/04/2022 Histopathologic type: Adenocarcinoma, NOS Stage prefix: Initial diagnosis Prostate specific antigen (PSA) range: Less than 10 Gleason primary pattern: 3 Gleason secondary pattern: 4 Gleason score: 7 Histologic grading system: 5 grade system Number of  biopsy cores examined: 12 Number of biopsy cores positive: 4 Location of positive needle core biopsies: One side   05/28/2022 Initial Diagnosis   Malignant neoplasm of prostate   03/27/2023 Imaging   MRI L Hip Heterogeneous osseous metastasis involving the pelvis involving the L4 and L5 vertebral bodies Probable extraosseous extension and small pathologic fracture of left ischial tuberosity with cortical disruption laterally.  Moderate sized partial-thickness tear of the adjacent left hamstring tendon origin Bone marrow edema surrounding the left SI joint with mild edema type signal along the superior margin of the left piriformis muscle.  Finding could be secondary to extraosseous extension of metastasis or represent posttraumatic change Small partial-thickness tear in the left gluteus muscle minimus tendon at the insertion on the greater trochanter with associated reactive bone marrow edema in the superior greater trochanter. Small low signal intensity focus along the insertional fibers of the left gluteus tendons which could represent calcium hydroxyapatite deposit Mild osteoarthritis of both. Possible enlarged left internal iliac/perirectal lymph node measuring 1.8 cm   04/08/2023 PET scan   PSMA PET 1. Extensive radiotracer avid skeletal metastasis involving the axillary and appendicular skeleton. 2. Extensive radiotracer avid lymph node metastasis in the pelvis, retroperitoneum, and mediastinum. 3. Multiple radiotracer avid small pulmonary metastasis.   04/10/2023 Tumor Marker   PSA 107   05/14/2023 -  Chemotherapy   Patient is on Treatment Plan : PROSTATE Docetaxel (73) q21d     Prostate cancer metastatic to multiple sites (HCC)  04/08/2023 PET scan   PSMA PET 1. Extensive radiotracer avid skeletal metastasis involving the axillary and appendicular skeleton. 2. Extensive radiotracer avid lymph node metastasis  in the pelvis, retroperitoneum, and mediastinum. 3. Multiple  radiotracer avid small pulmonary metastasis.   04/09/2023 Initial Diagnosis   Prostate cancer metastatic to multiple sites [facility])   04/10/2023 Tumor Marker   PSA 107   04/10/2023 Cancer Staging   Staging form: Prostate, AJCC 8th Edition - Clinical stage from 04/10/2023: Stage IVB (rcT0, cN1, cM1c, PSA: 107, Grade Group: 2) - Signed by Margaretmary Dys, MD on 04/11/2023 Stage prefix: Recurrence Prostate specific antigen (PSA) range: 20 or greater Gleason score: 7 Histologic grading system: 5 grade system      RISK FACTORS:  Dermatology 11/2022, reportedly normal.  Colonoscopy scheduled for this summer. Last colonoscopy 02/2020 identified two sessile serrated polyps, Colonoscopy 2016 identified two adenomatous polyps.   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   Wears glasses     Past Surgical History:  Procedure Laterality Date   COLONOSCOPY  2021   TA/hems-5 yr recall   Hand/Finger surgery  1997   KNEE ARTHROSCOPY W/ MENISCAL REPAIR Right    x 2   POLYPECTOMY  2016   TA   PROSTATE BIOPSY     RADIOACTIVE SEED IMPLANT N/A 08/13/2022   Procedure: RADIOACTIVE SEED IMPLANT/BRACHYTHERAPY IMPLANT;  Surgeon: Belva Agee, MD;  Location: [facility];  Service: Urology;  Laterality: N/A;  90 MINS   ROTATOR CUFF REPAIR Left    SPACE OAR INSTILLATION N/A 08/13/2022   Procedure: SPACE OAR INSTILLATION;  Surgeon: Belva Agee, MD;  Location: [facility];  Service: Urology;  Laterality: N/A;   TONSILLECTOMY     WISDOM TOOTH EXTRACTION      Social History    Socioeconomic History   Marital status: Married    Spouse name: Not on file   Number of children: Not on file   Years of education: Not on file   Highest education level: Master's degree (e.g., MA, MS, MEng, MEd, MSW, MBA)  Occupational History   Not on file  Tobacco Use   Smoking status: Never   Smokeless tobacco: Never  Vaping Use   Vaping status: Never Used  Substance and Sexual Activity   Alcohol use: Not Currently    Alcohol/week: 1.0 standard drink of alcohol    Types: 1 Standard drinks or equivalent per week   Drug use: No   Sexual activity: Yes    Birth control/protection: None  Other Topics Concern   Not on file  Social History Narrative   Not on file   Social Drivers of Health   Financial Resource Strain: Low Risk  (05/28/2023)   Overall Financial Resource Strain (CARDIA)    Difficulty of Paying Living Expenses: Not hard at all  Food Insecurity: No Food Insecurity (05/28/2023)   Hunger Vital Sign    Worried About Running Out of Food in the Last Year: Never true    Ran Out of Food in the Last Year: Never true  Transportation Needs: No Transportation Needs (05/28/2023)   PRAPARE - Transportation    Lack of Transportation (  Medical): No    Lack of Transportation (Non-Medical): No  Physical Activity: Sufficiently Active (05/28/2023)   Exercise Vital Sign    Days of Exercise per Week: 5 days    Minutes of Exercise per Session: 60 min  Stress: No Stress Concern Present (05/28/2023)   [employer] of Occupational Health - Occupational Stress Questionnaire    Feeling of Stress : Only a little  Social Connections: Socially Integrated (05/28/2023)   Social Connection and Isolation Panel [NHANES]    Frequency of Communication with Friends and Family: More than three times a week    Frequency of Social Gatherings with Friends and Family: More than three times a week    Attends Religious Services: More than 4 times per year    Active Member of [employer] or Organizations:  Yes    Attends Engineer, structural: More than 4 times per year    Marital Status: Married     FAMILY HISTORY:  We obtained a detailed, 4-generation family history.  Significant diagnoses are listed below: Family History  Problem Relation Age of Onset   Stroke Mother 61   Healthy Sister    Healthy Sister    Leukemia Cousin 15   Breast cancer Cousin 79   Lymphoma Cousin    Bladder Cancer Cousin 71   Breast cancer Other    Colon cancer Neg Hx    Colon polyps Neg Hx    Esophageal cancer Neg Hx    Stomach cancer Neg Hx    Rectal cancer Neg Hx     Johnny Davenport is unaware of previous family history of genetic testing for hereditary cancer risks. There is no reported Ashkenazi Jewish ancestry. There is no known consanguinity.  GENETIC COUNSELING ASSESSMENT: Johnny Davenport is a 73 y.o. male with a personal and family history of cancer which is somewhat suggestive an inherited predisposition to cancer. We, therefore, discussed and recommended the following at today's visit.   DISCUSSION: We discussed that, in general, most cancer is not inherited in families, but instead is sporadic or familial. Sporadic cancers occur by chance and typically happen at older ages (>50 years) as this type of cancer is caused by genetic changes acquired during an individual's lifetime. Some families have more cancers than would be expected by chance; however, the ages or types of cancer are not consistent with a known genetic mutation or known genetic mutations have been ruled out. This type of familial cancer is thought to be due to a combination of multiple genetic, environmental, hormonal, and lifestyle factors. While this combination of factors likely increases the risk of cancer, the exact source of this risk is not currently identifiable or testable.  We discussed that 5 - 10% of cancer is hereditary. We discussed that testing is beneficial for several reasons including knowing how to follow individuals after  completing their treatment, identifying whether potential treatment options such as PARP inhibitors would be beneficial, and understand if other family members could be at risk for cancer and allow them to undergo genetic testing.   We reviewed the characteristics, features and inheritance patterns of hereditary cancer syndromes. We also discussed genetic testing, including the appropriate family members to test, the process of testing, insurance coverage and turn-around-time for results. We discussed the implications of a negative, positive, carrier and/or variant of uncertain significant result. Johnny Davenport  was offered a common hereditary cancer panel (36+ genes) and an expanded pan-cancer panel (70+ genes). Johnny Davenport was informed of the benefits and  limitations of each panel, including that expanded pan-cancer panels contain genes that do not have clear management guidelines at this point in time.  We also discussed that as the number of genes included on a panel increases, the chances of variants of uncertain significance increases. Johnny Davenport decided to pursue genetic testing for the 44 gene panel. The CancerNext-Expanded gene panel offered by [facility] and includes sequencing, rearrangement, and RNA analysis for the following 76 genes: AIP, ALK, APC, ATM, AXIN2, BAP1, BARD1, BMPR1A, BRCA1, BRCA2, BRIP1, CDC73, CDH1, CDK4, CDKN1B, CDKN2A, CEBPA, CHEK2, CTNNA1, DDX41, DICER1, ETV6, FH, FLCN, GATA2, LZTR1, MAX, MBD4, MEN1, MET, MLH1, MSH2, MSH3, MSH6, MUTYH, NF1, NF2, NTHL1, PALB2, PHOX2B, PMS2, POT1, PRKAR1A, PTCH1, PTEN, RAD51C, RAD51D, RB1, RET, RUNX1, SDHA, SDHAF2, SDHB, SDHC, SDHD, SMAD4, SMARCA4, SMARCB1, SMARCE1, STK11, SUFU, TMEM127, TP53, TSC1, TSC2, VHL, and WT1 (sequencing and deletion/duplication); EGFR, HOXB13, KIT, MITF, PDGFRA, POLD1, and POLE (sequencing only); EPCAM and GREM1 (deletion/duplication only).   Based on Johnny Davenport personal history of metastatic prostate cancer, he meets medical  criteria for genetic testing. Despite that he meets criteria, he may still have an out of pocket cost. We discussed that if his out of pocket cost for testing is over $100, the laboratory will call and confirm whether he wants to proceed with testing.  If the out of pocket cost of testing is less than $100 he will be billed by the genetic testing laboratory.   We discussed that some people do not want to undergo genetic testing due to fear of genetic discrimination.  The Genetic Information Nondiscrimination Act (GINA) was signed into federal law in 2008. GINA prohibits health insurers and most employers from discriminating against individuals based on genetic information (including the results of genetic tests and family history information). According to GINA, health insurance companies cannot consider genetic information to be a preexisting condition, nor can they use it to make decisions regarding coverage or rates. GINA also makes it illegal for most employers to use genetic information in making decisions about hiring, firing, promotion, or terms of employment. It is important to note that GINA does not offer protections for life insurance, disability insurance, or long-term care insurance. GINA does not apply to those in the [employer], those who work for companies with less than 15 employees, and new life insurance or long-term disability insurance policies.  Health status due to a cancer diagnosis is not protected under GINA. More information about GINA can be found by visiting [email or web address].  We reviewed the characteristics, features and inheritance patterns of hereditary cancer syndromes. We also discussed genetic testing, including the appropriate family members to test, the process of testing, insurance coverage and turn-around-time for results. We discussed the implications of a negative, positive and/or variant of uncertain significant result.   PLAN: After considering the risks, benefits, and  limitations, Johnny Davenport provided informed consent to pursue genetic testing and the blood sample will be sent to [facility] for analysis of the CancerNext-Expanded +RNAinsight test. Results should be available within approximately 2-3 weeks' time, at which point they will be disclosed by telephone to Johnny Davenport, as will any additional recommendations warranted by these results. Johnny Davenport will receive a summary of his genetic counseling visit and a copy of his results once available. This information will also be available in Epic.   Blood draw is scheduled for 06/20/23. We attempted to collect a sample at port flush on 06/03/23, but per the lab it does not sound like an  [facility] kit was drawn.   Lastly, we encouraged Johnny Davenport to remain in contact with cancer genetics annually so that we can continuously update the family history and inform him of any changes in cancer genetics and testing that may be of benefit for this family.   Johnny Davenport questions were answered to his satisfaction today. Our contact information was provided should additional questions or concerns arise. Thank you for the referral and allowing [place] to share in the care of your patient.   Vassie Moment, MS, [facility] Licensed, Retail [email or web address] phone: [phone]  45 minutes were spent on the date of the encounter in service to the patient including preparation, face-to-face consultation, documentation and care coordination.  The patient brought his wife to the appointment. Drs. Meliton Rattan, and/or [place] were available for questions, if needed..   _______________________________________________________________________ For Office Staff:  Number of people involved in session: 2 Was an Intern/ student involved with case: yes; [employer] Intern Candace Teague participated in this visit with direct supervision.

## 2023-06-09 ENCOUNTER — Telehealth: Payer: Self-pay

## 2023-06-09 NOTE — Telephone Encounter (Signed)
 Scheduling questions. I asked the Patient their preference for days of the week and times that work best for them.

## 2023-06-11 ENCOUNTER — Other Ambulatory Visit: Payer: Self-pay | Admitting: Family Medicine

## 2023-06-11 DIAGNOSIS — G43009 Migraine without aura, not intractable, without status migrainosus: Secondary | ICD-10-CM

## 2023-06-11 LAB — TESTOSTERONE: Testosterone: <3 ng/dL — ABNORMAL LOW (ref 264–916)

## 2023-06-14 ENCOUNTER — Ambulatory Visit
Admission: EM | Admit: 2023-06-14 | Discharge: 2023-06-14 | Disposition: A | Discharge status: Home / Self Care | Attending: Family Medicine | Admitting: Family Medicine

## 2023-06-14 DIAGNOSIS — K5900 Constipation, unspecified: Secondary | ICD-10-CM | POA: Insufficient documentation

## 2023-06-14 DIAGNOSIS — R300 Dysuria: Secondary | ICD-10-CM | POA: Insufficient documentation

## 2023-06-14 LAB — POCT URINALYSIS DIP (MANUAL ENTRY)
Bilirubin, UA: NEGATIVE
Glucose, UA: NEGATIVE mg/dL
Ketones, POC UA: NEGATIVE mg/dL
Leukocytes, UA: NEGATIVE
Nitrite, UA: NEGATIVE
Protein Ur, POC: 100 mg/dL — AB
Spec Grav, UA: 1.02 (ref 1.010–1.025)
Urobilinogen, UA: 0.2 U/dL
pH, UA: 5.5 (ref 5.0–8.0)

## 2023-06-14 NOTE — ED Provider Notes (Signed)
 [facility]    CSN: [id number] Arrival date & time: 06/14/23  5188      History   Chief Complaint Chief Complaint  Patient presents with   Dysuria    HPI [facility] is a 73 y.o. male.   The history is provided by the patient.  Dysuria Presenting symptoms: dysuria   Dysuria and frequency onset times several days.  Denies urgency, cloudy or malodorous urine, hematuria, fever, chills, sweats, abdominal pain, nausea or vomiting.  He admits constipation.  Has had similar symptoms in the past.  He is concerned it could be a urinary tract infection.  He is currently being treated for metastatic prostate cancer, on chemotherapy.  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   Wears glasses     Patient Active Problem List   Diagnosis Date Noted   Ear infection 06/03/2023   Mucositis 06/03/2023   At risk for side effect of medication 04/11/2023   Prostate cancer metastatic to multiple sites [facility]) 04/09/2023   History of prostate cancer 05/28/2022   Prediabetes 05/13/2022   Aneurysm of thoracic aorta (HCC) 10/21/2021   Lateral epicondylitis, left elbow 11/16/2020   Arthritis of carpometacarpal [facility]) joint of right thumb 11/16/2020   Dyslipidemia 09/30/2020    Past Surgical History:  Procedure Laterality Date   COLONOSCOPY  2021   TA/hems-5 yr recall   Hand/Finger surgery  1997   KNEE ARTHROSCOPY W/ MENISCAL REPAIR Right    x 2   POLYPECTOMY  2016   TA   PROSTATE BIOPSY     RADIOACTIVE SEED IMPLANT N/A 08/13/2022   Procedure: RADIOACTIVE SEED IMPLANT/BRACHYTHERAPY IMPLANT;   Surgeon: Belva Agee, MD;  Location: [facility];  Service: Urology;  Laterality: N/A;  90 MINS   ROTATOR CUFF REPAIR Left    SPACE OAR INSTILLATION N/A 08/13/2022   Procedure: SPACE OAR INSTILLATION;  Surgeon: Belva Agee, MD;  Location: [facility];  Service: Urology;  Laterality: N/A;   TONSILLECTOMY     WISDOM TOOTH EXTRACTION         Home Medications    Prior to Admission medications   Medication Sig Start Date End Date Taking? Authorizing Provider  clobetasol (TEMOVATE) 0.05 % GEL Dry canker sore with a tissue or cloth and then apply a small amount of ointment three times a day. 05/29/23   Copland, Gwenlyn Found, MD  darolutamide (NUBEQA) 300 MG tablet Take 2 tablets (600 mg total) by mouth 2 (two) times daily with a meal. 04/15/23   Melven Sartorius, MD  dexamethasone (DECADRON) 4 MG tablet Take 2 tabs by mouth 2 times daily starting day before chemo. Then take 2 tabs daily for 2 days starting day after chemo. Take with food. 05/06/23   Melven Sartorius, MD  fluticasone (FLONASE) 50 MCG/ACT nasal spray Place 1 spray into both nostrils daily.    [provider]  magic mouthwash w/lidocaine SOLN Take 5 mLs by mouth 3 (three) times daily as needed for  mouth pain. Suspension contains equal amounts of Maalox Extra Strength, nystatin, diphenhydramine and lidocaine. 05/23/23   Melven Sartorius, MD  omeprazole (PRILOSEC) 20 MG capsule Take 1 capsule (20 mg total) by mouth daily. 05/07/23   Copland, Gwenlyn Found, MD  ondansetron (ZOFRAN) 8 MG tablet Take 1 tablet (8 mg total) by mouth every 8 (eight) hours as needed for nausea or vomiting. 05/06/23   Melven Sartorius, MD  prochlorperazine (COMPAZINE) 10 MG tablet Take 1 tablet (10 mg total) by mouth every 6 (six) hours as needed for nausea or vomiting (take twice daily on days 1 to 3 of chemotherapy, then follow as needed instruction if feeling fine by day 4). 05/06/23   Melven Sartorius, MD  relugolix (ORGOVYX)  120 MG tablet Take 1 tablet (120 mg total) by mouth daily. 04/15/23   Melven Sartorius, MD  SUMAtriptan (IMITREX) 50 MG tablet TAKE ONE TABLET BY MOUTH AT ONSET OF MIGRAINE. MAY REPEAT DOSE WITH ONE TABLET IN TWO HOURS IF NEEDED. DO NOT EXCEED TWO TABLETS IN 24 HOURS. 06/12/23   Copland, Gwenlyn Found, MD    Family History Family History  Problem Relation Age of Onset   Stroke Mother 66   Healthy Sister    Healthy Sister    Leukemia Cousin 15   Breast cancer Cousin 23   Lymphoma Cousin    Bladder Cancer Cousin 54   Breast cancer Other    Colon cancer Neg Hx    Colon polyps Neg Hx    Esophageal cancer Neg Hx    Stomach cancer Neg Hx    Rectal cancer Neg Hx     Social History Social History   Tobacco Use   Smoking status: Never   Smokeless tobacco: Never  Vaping Use   Vaping status: Never Used  Substance Use Topics   Alcohol use: Not Currently    Alcohol/week: 1.0 standard drink of alcohol    Types: 1 Standard drinks or equivalent per week   Drug use: No     Allergies   Other   Review of Systems Review of Systems  Genitourinary:  Positive for dysuria.     Physical Exam Triage Vital Signs ED Triage Vitals  Encounter Vitals Group     BP 06/14/23 0919 107/70     Systolic BP Percentile --      Diastolic BP Percentile --      Pulse Rate 06/14/23 0919 90     Resp 06/14/23 0919 16     Temp 06/14/23 0919 (!) 97.5 F (36.4 C)     Temp Source 06/14/23 0919 Oral     SpO2 06/14/23 0919 94 %     Weight --      Height --      Head Circumference --      Peak Flow --      Pain Score 06/14/23 0918 0     Pain Loc --      Pain Education --      Exclude from Growth Chart --    No data found.  Updated Vital Signs BP 107/70 (BP Location: Right Arm)   Pulse 90   Temp (!) 97.5 F (36.4 C) (Oral)   Resp 16   SpO2 94%   Visual Acuity Right Eye Distance:   Left Eye Distance:   Bilateral Distance:    Right Eye Near:   Left Eye Near:    Bilateral Near:     Physical  Exam Cardiovascular:  Rate and Rhythm: Normal rate.  Pulmonary:     Effort: Pulmonary effort is normal. No respiratory distress.  Abdominal:     General: Bowel sounds are normal.     Palpations: Abdomen is soft.     Tenderness: There is no abdominal tenderness. There is no right CVA tenderness or left CVA tenderness.  Skin:    General: Skin is warm and dry.  Neurological:     Mental Status: He is alert and oriented to person, place, and time.  Psychiatric:        Mood and Affect: Mood normal.      UC Treatments / Results  Labs (all labs ordered are listed, but only abnormal results are displayed) Labs Reviewed  POCT URINALYSIS DIP (MANUAL ENTRY) - Abnormal; Notable for the following components:      Result Value   Blood, UA trace-intact (*)    Protein Ur, POC =100 (*)    All other components within normal limits  URINE CULTURE    EKG   Radiology No results found.  Procedures Procedures (including critical care time)  Medications Ordered in UC Medications - No data to display  Initial Impression / Assessment and Plan / UC Course  I have reviewed the triage vital signs and the nursing notes.  Pertinent labs & imaging results that were available during my care of the patient were reviewed by me and considered in my medical decision making (see chart for details).     73 year old male currently undergoing chemotherapy for metastatic prostate cancer complains of dysuria for several days.  His point-of-care urine dipstick shows trace RBCs and 100 of protein otherwise it is normal.  Discussed with patient this is not consistent with urinary tract infection will send urine culture.  He states he had similar symptoms in the past when he is been constipated, home management of constipation reviewed with patient.  Discussed with patient his dysuria may be related to his chemotherapy recommend he continue to drink plenty of fluids and follow-up with oncologist on Monday.   Should go to ED for development of fever Final Clinical Impressions(s) / UC Diagnoses   Final diagnoses:  Dysuria  Constipation, unspecified constipation type   Discharge Instructions   None    ED Prescriptions   None    PDMP not reviewed this encounter.   Meliton Rattan, [place] 06/14/23 1025

## 2023-06-14 NOTE — ED Triage Notes (Signed)
 Patient presents to UC for dysuria x 2 days. No OTC treatment. Receiving chemo treatment.   Denies fever.

## 2023-06-15 LAB — URINE CULTURE: Culture: NO GROWTH

## 2023-06-20 ENCOUNTER — Other Ambulatory Visit: Payer: Self-pay

## 2023-06-20 ENCOUNTER — Inpatient Hospital Stay

## 2023-06-20 DIAGNOSIS — C61 Malignant neoplasm of prostate: Secondary | ICD-10-CM | POA: Diagnosis present

## 2023-06-20 DIAGNOSIS — R197 Diarrhea, unspecified: Secondary | ICD-10-CM

## 2023-06-20 DIAGNOSIS — C7951 Secondary malignant neoplasm of bone: Secondary | ICD-10-CM | POA: Insufficient documentation

## 2023-06-20 DIAGNOSIS — K1230 Oral mucositis (ulcerative), unspecified: Secondary | ICD-10-CM | POA: Diagnosis not present

## 2023-06-20 DIAGNOSIS — R3589 Other polyuria: Secondary | ICD-10-CM

## 2023-06-20 DIAGNOSIS — Z79899 Other long term (current) drug therapy: Secondary | ICD-10-CM | POA: Insufficient documentation

## 2023-06-20 DIAGNOSIS — Z803 Family history of malignant neoplasm of breast: Secondary | ICD-10-CM

## 2023-06-20 DIAGNOSIS — Z5111 Encounter for antineoplastic chemotherapy: Secondary | ICD-10-CM | POA: Diagnosis not present

## 2023-06-20 DIAGNOSIS — Z8546 Personal history of malignant neoplasm of prostate: Secondary | ICD-10-CM

## 2023-06-20 DIAGNOSIS — N3000 Acute cystitis without hematuria: Secondary | ICD-10-CM | POA: Insufficient documentation

## 2023-06-20 DIAGNOSIS — D6481 Anemia due to antineoplastic chemotherapy: Secondary | ICD-10-CM | POA: Insufficient documentation

## 2023-06-20 LAB — URINALYSIS, COMPLETE (UACMP) WITH MICROSCOPIC
Bilirubin Urine: NEGATIVE
Glucose, UA: NEGATIVE mg/dL
Hgb urine dipstick: NEGATIVE
Ketones, ur: NEGATIVE mg/dL
Leukocytes,Ua: NEGATIVE
Nitrite: POSITIVE — AB
Protein, ur: 30 mg/dL — AB
Specific Gravity, Urine: 1.016 (ref 1.005–1.030)
pH: 5 (ref 5.0–8.0)

## 2023-06-20 LAB — GENETIC SCREENING ORDER

## 2023-06-20 MED ORDER — PRAMOXINE HCL (PERIANAL) 1 % EX FOAM: 1.0000 | Freq: Three times a day (TID) | CUTANEOUS | 0 refills | Status: DC | PRN

## 2023-06-20 MED ORDER — LEVOFLOXACIN 250 MG PO TABS: 250.0000 mg | ORAL_TABLET | Freq: Every day | ORAL | 0 refills | Status: DC

## 2023-06-20 NOTE — Progress Notes (Signed)
 Patient requested to speak with RN after his lab appointment today.  Patient with c/o urinary frequency, burning, increase in bowel movements (7-8 times daily), and discomfort when sitting.   Patient with metastatic prostate cancer, currently under active treatment.  Completed 2 cycles of Taxotere, and on relugolix and darolutamide.    Patient denies BM's being diarrhea, just increase in amount.  Patient was taking a laxative, however, discontinued due to frequency of BM's.  Remains on a stool softener.  Denies any fever, or nausea.  Patient is currently taking AZO to help with symptoms of polyuria and dysuria.    RN reviewed symptoms with MD.  MD recommendations to obtain urinalysis, culture, and Rx provided for proctofoam provided.  RN will follow up with urinalysis results.  RN provided education if fever, increase in abd pain, or nausea develop to utilize ED for symptom management. Patient had questions related to how to prevent constipation, RN provided education for fluid intake, ambulation, and increase in fiber, all of which patient is doing and having positive results.  Patient and wife verbalized understanding.

## 2023-06-20 NOTE — Progress Notes (Signed)
 UA results + for Nitrates.  Per MD recommendations for Levofloxacin 250mg  daily X 5 days.  RN notified patient, verbalized understanding.

## 2023-06-21 LAB — URINE CULTURE: Culture: NO GROWTH

## 2023-06-23 DIAGNOSIS — N3000 Acute cystitis without hematuria: Secondary | ICD-10-CM | POA: Insufficient documentation

## 2023-06-23 MED ORDER — CLOBETASOL PROPIONATE 0.05 % EX GEL: CUTANEOUS | 1 refills | Status: DC

## 2023-06-23 NOTE — Assessment & Plan Note (Addendum)
 Continue antibiotics for total of 10 days. Additional script sent.

## 2023-06-23 NOTE — Progress Notes (Addendum)
 Addendum Germline testing: Negative CancerNext-Expanded   [facility] OFFICE PROGRESS NOTE  Patient Care Team: Copland, Skipper Dumas, MD as PCP - General (Family Medicine) Katheleen Palmer, RN as Oncology Nurse Navigator Manny, Harvey Linen., MD as Consulting Physician (Urology) Kenith Payer, MD as Consulting Physician (Radiation Oncology) Neda Balk, RN as Registered Nurse Debbie Fails, Laura Polio, NP as Nurse Practitioner (Hematology and Oncology)  [facility] is a 73 y.o.male with history of BPH, arthritis, hyperlipidemia, prostate cancer [facility].  Currently on active treatment with docetaxel  triplet therapy.   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  Germline testing: drawn today Somatic testing: will request Tempus on liquid today Treatment: s/p brachytherapy in 08/2022  Current treatment: Triple therapy with docetaxel , Relugolix  and darolutamide .   He gets monthly shipment of daro and relugolix  to his house.  Assessment & Plan Prostate cancer metastatic to multiple sites [facility]) Cycle 3 of docetaxel  We will follow-up PSA each cycle Continue Relugolix  and darolutamide  Acute cystitis without hematuria Continue antibiotics for total of 10 days. Additional script sent. At risk for side effect of medication Monitor for febrile neutropenia with symptoms and lab baseline bone mineral density study and then every 2 years calcium  (1000-1200 mg daily from food and supplements) and vitamin D3 (1000 IU daily) Control and prevent diabetes Aggressive cardiovascular risk management Weight-bearing exercises (30 minutes per day) Limit alcohol consumption and avoid smoking Mucositis Magic mouthwash as needed Use clobetasol  gel as needed Anemia due to antineoplastic chemotherapy Will need lab  monitoring every cycle  Return for cycle 4 as scheduled.   Lowanda Ruddy, MD  INTERVAL HISTORY: he returns for treatment follow-up. No fever or chill. Some constipation. Using stool softener. No stomach pain. No bleeding. Still walking daily. Complications related to previous cycle of chemotherapy included anemia,  Oncology History  History of prostate cancer  05/06/2022 Cancer Staging   Staging form: Prostate, AJCC 8th Edition - Clinical stage from 05/06/2022: Stage IIB (cT2, cN0, cM0, PSA: 7.3, Grade Group: 2) - Signed by Keitha Pata, PA-C on 07/04/2022 Histopathologic type: Adenocarcinoma, NOS Stage prefix: Initial diagnosis Prostate specific antigen (PSA) range: Less than 10 Gleason primary pattern: 3 Gleason secondary pattern: 4 Gleason score: 7 Histologic grading system: 5 grade system Number of biopsy cores examined: 12 Number of biopsy cores positive: 4 Location of positive needle core biopsies: One side   05/28/2022 Initial Diagnosis   Malignant neoplasm of prostate   03/27/2023 Imaging   MRI L Hip Heterogeneous osseous metastasis involving the pelvis involving the L4 and L5 vertebral bodies Probable extraosseous extension and small pathologic fracture of left ischial tuberosity with cortical disruption laterally.  Moderate sized partial-thickness tear of the adjacent left hamstring tendon origin Bone marrow edema surrounding the left SI joint with mild edema type signal along the superior margin of the left piriformis muscle.  Finding could be secondary to extraosseous extension of metastasis or represent posttraumatic change Small partial-thickness tear in the left gluteus muscle minimus tendon at the insertion on the greater trochanter with associated reactive bone marrow edema in the superior greater trochanter. Small low signal intensity focus along the insertional fibers of the left gluteus tendons which could represent calcium  hydroxyapatite deposit Mild  osteoarthritis of both. Possible enlarged left internal iliac/perirectal lymph node measuring 1.8 cm   04/08/2023 PET scan  PSMA PET 1. Extensive radiotracer avid skeletal metastasis involving the axillary and appendicular skeleton. 2. Extensive radiotracer avid lymph node metastasis in the pelvis, retroperitoneum, and mediastinum. 3. Multiple radiotracer avid small pulmonary metastasis.   04/10/2023 Tumor Marker   PSA 107   05/14/2023 -  Chemotherapy   Patient is on Treatment Plan : PROSTATE Docetaxel  (73) q21d     Prostate cancer metastatic to multiple sites (HCC)  04/08/2023 PET scan   PSMA PET 1. Extensive radiotracer avid skeletal metastasis involving the axillary and appendicular skeleton. 2. Extensive radiotracer avid lymph node metastasis in the pelvis, retroperitoneum, and mediastinum. 3. Multiple radiotracer avid small pulmonary metastasis.   04/09/2023 Initial Diagnosis   Prostate cancer metastatic to multiple sites [facility])   04/10/2023 Tumor Marker   PSA 107   04/10/2023 Cancer Staging   Staging form: Prostate, AJCC 8th Edition - Clinical stage from 04/10/2023: Stage IVB (rcT0, cN1, cM1c, PSA: 107, Grade Group: 2) - Signed by Kenith Payer, MD on 04/11/2023 Stage prefix: Recurrence Prostate specific antigen (PSA) range: 20 or greater Gleason score: 7 Histologic grading system: 5 grade system      PHYSICAL EXAMINATION: ECOG PERFORMANCE STATUS: 1 - Symptomatic but completely ambulatory  Vitals:   06/24/23 0834  BP: 118/85  Pulse: 79  Resp: 18  Temp: (!) 97.4 F (36.3 C)  SpO2: 96%   Filed Weights   06/24/23 0834  Weight: 184 lb 9.6 oz (83.7 kg)   No distress.  Relevant data reviewed during this visit included labs

## 2023-06-23 NOTE — Assessment & Plan Note (Addendum)
 Cycle 3 of docetaxel We will follow-up PSA each cycle Continue Relugolix and darolutamide

## 2023-06-23 NOTE — Addendum Note (Signed)
 Addended by: Arnetta Odeh D on: 06/23/2023 01:18 PM   Modules accepted: Orders

## 2023-06-23 NOTE — Assessment & Plan Note (Signed)
 Monitor for febrile neutropenia with symptoms and lab baseline bone mineral density study and then every 2 years calcium (1000-1200 mg daily from food and supplements) and vitamin D3 (1000 IU daily) Control and prevent diabetes Aggressive cardiovascular risk management Weight-bearing exercises (30 minutes per day) Limit alcohol consumption and avoid smoking

## 2023-06-24 ENCOUNTER — Inpatient Hospital Stay: Payer: Medicare Other

## 2023-06-24 VITALS — BP 118/85 | HR 79 | Temp 97.4°F | Resp 18 | Ht 72.0 in | Wt 184.6 lb

## 2023-06-24 DIAGNOSIS — D6481 Anemia due to antineoplastic chemotherapy: Secondary | ICD-10-CM | POA: Diagnosis not present

## 2023-06-24 DIAGNOSIS — C61 Malignant neoplasm of prostate: Secondary | ICD-10-CM | POA: Diagnosis not present

## 2023-06-24 DIAGNOSIS — C7951 Secondary malignant neoplasm of bone: Secondary | ICD-10-CM | POA: Diagnosis not present

## 2023-06-24 DIAGNOSIS — Z79899 Other long term (current) drug therapy: Secondary | ICD-10-CM | POA: Diagnosis not present

## 2023-06-24 DIAGNOSIS — K1230 Oral mucositis (ulcerative), unspecified: Secondary | ICD-10-CM

## 2023-06-24 DIAGNOSIS — Z5111 Encounter for antineoplastic chemotherapy: Secondary | ICD-10-CM | POA: Diagnosis not present

## 2023-06-24 DIAGNOSIS — Z8546 Personal history of malignant neoplasm of prostate: Secondary | ICD-10-CM

## 2023-06-24 DIAGNOSIS — N3000 Acute cystitis without hematuria: Secondary | ICD-10-CM

## 2023-06-24 DIAGNOSIS — T451X5A Adverse effect of antineoplastic and immunosuppressive drugs, initial encounter: Secondary | ICD-10-CM | POA: Diagnosis not present

## 2023-06-24 DIAGNOSIS — Z9189 Other specified personal risk factors, not elsewhere classified: Secondary | ICD-10-CM | POA: Diagnosis not present

## 2023-06-24 LAB — CMP (CANCER CENTER ONLY)
ALT: 13 U/L (ref 0–44)
AST: 16 U/L (ref 15–41)
Albumin: 3.7 g/dL (ref 3.5–5.0)
Alkaline Phosphatase: 113 U/L (ref 38–126)
Anion gap: 8 (ref 5–15)
BUN: 20 mg/dL (ref 8–23)
CO2: 24 mmol/L (ref 22–32)
Calcium: 9.3 mg/dL (ref 8.9–10.3)
Chloride: 107 mmol/L (ref 98–111)
Creatinine: 0.84 mg/dL (ref 0.61–1.24)
GFR, Estimated: >60 mL/min (ref >60)
Glucose, Bld: 92 mg/dL (ref 70–99)
Potassium: 4.2 mmol/L (ref 3.5–5.1)
Sodium: 139 mmol/L (ref 135–145)
Total Bilirubin: 0.4 mg/dL (ref 0.0–1.2)
Total Protein: 6.6 g/dL (ref 6.5–8.1)

## 2023-06-24 LAB — CBC WITH DIFFERENTIAL (CANCER CENTER ONLY)
Abs Immature Granulocytes: 0.05 10*3/uL (ref 0.00–0.07)
Basophils Absolute: 0 10*3/uL (ref 0.0–0.1)
Basophils Relative: 0 %
Eosinophils Absolute: 0 10*3/uL (ref 0.0–0.5)
Eosinophils Relative: 0 %
HCT: 29 % — ABNORMAL LOW (ref 39.0–52.0)
Hemoglobin: 9.4 g/dL — ABNORMAL LOW (ref 13.0–17.0)
Immature Granulocytes: 1 %
Lymphocytes Relative: 7 %
Lymphs Abs: 0.7 10*3/uL (ref 0.7–4.0)
MCH: 30.8 pg (ref 26.0–34.0)
MCHC: 32.4 g/dL (ref 30.0–36.0)
MCV: 95.1 fL (ref 80.0–100.0)
Monocytes Absolute: 0.8 10*3/uL (ref 0.1–1.0)
Monocytes Relative: 8 %
Neutro Abs: 8.7 10*3/uL — ABNORMAL HIGH (ref 1.7–7.7)
Neutrophils Relative %: 84 %
Platelet Count: 354 10*3/uL (ref 150–400)
RBC: 3.05 MIL/uL — ABNORMAL LOW (ref 4.22–5.81)
RDW: 17.1 % — ABNORMAL HIGH (ref 11.5–15.5)
WBC Count: 10.3 10*3/uL (ref 4.0–10.5)
nRBC: 0 % (ref 0.0–0.2)

## 2023-06-24 LAB — LACTATE DEHYDROGENASE: LDH: 142 U/L (ref 98–192)

## 2023-06-24 MED ORDER — SODIUM CHLORIDE 0.9% FLUSH: 10.0000 mL | INTRAVENOUS | Status: DC | PRN

## 2023-06-24 MED ORDER — DEXAMETHASONE SODIUM PHOSPHATE 10 MG/ML IJ SOLN
10.0000 mg | Freq: Once | INTRAMUSCULAR | Status: AC
  Administered 2023-06-24: 10 mg via INTRAVENOUS
  Filled 2023-06-24: qty 1

## 2023-06-24 MED ORDER — SODIUM CHLORIDE 0.9 % IV SOLN: INTRAVENOUS | Status: DC

## 2023-06-24 MED ORDER — SODIUM CHLORIDE 0.9 % IV SOLN
75.0000 mg/m2 | Freq: Once | INTRAVENOUS | Status: AC
  Administered 2023-06-24: 160 mg via INTRAVENOUS
  Filled 2023-06-24: qty 16

## 2023-06-24 MED ORDER — LEVOFLOXACIN 250 MG PO TABS: 250.0000 mg | ORAL_TABLET | Freq: Every day | ORAL | 0 refills | Status: AC

## 2023-06-24 MED ORDER — HEPARIN SOD (PORK) LOCK FLUSH 100 UNIT/ML IV SOLN: 500.0000 [IU] | Freq: Once | INTRAVENOUS | Status: DC | PRN

## 2023-06-24 NOTE — Assessment & Plan Note (Addendum)
 Will need lab monitoring every cycle

## 2023-06-24 NOTE — Patient Instructions (Signed)
 [facility] - A DEPT OF [facility]  Discharge Instructions: Thank you for choosing [facility] to provide your oncology and hematology care.   If you have a lab appointment with the Cancer Center, please go directly to the Cancer Center and check in at the registration area.   Wear comfortable clothing and clothing appropriate for easy access to any Portacath or PICC line.   We strive to give you quality time with your provider. You may need to reschedule your appointment if you arrive late (15 or more minutes).  Arriving late affects you and other patients whose appointments are after yours.  Also, if you miss three or more appointments without notifying the office, you may be dismissed from the clinic at the provider's discretion.      For prescription refill requests, have your pharmacy contact our office and allow 72 hours for refills to be completed.    Today you received the following chemotherapy and/or immunotherapy agents: Docetaxel      To help prevent nausea and vomiting after your treatment, we encourage you to take your nausea medication as directed.  BELOW ARE SYMPTOMS THAT SHOULD BE REPORTED IMMEDIATELY: *FEVER GREATER THAN 100.4 F (38 C) OR HIGHER *CHILLS OR SWEATING *NAUSEA AND VOMITING THAT IS NOT CONTROLLED WITH YOUR NAUSEA MEDICATION *UNUSUAL SHORTNESS OF BREATH *UNUSUAL BRUISING OR BLEEDING *URINARY PROBLEMS (pain or burning when urinating, or frequent urination) *BOWEL PROBLEMS (unusual diarrhea, constipation, pain near the anus) TENDERNESS IN MOUTH AND THROAT WITH OR WITHOUT PRESENCE OF ULCERS (sore throat, sores in mouth, or a toothache) UNUSUAL RASH, SWELLING OR PAIN  UNUSUAL VAGINAL DISCHARGE OR ITCHING   Items with * indicate a potential emergency and should be followed up as soon as possible or go to the Emergency Department if any problems should occur.  Please show the CHEMOTHERAPY ALERT CARD or IMMUNOTHERAPY  ALERT CARD at check-in to the Emergency Department and triage nurse.  Should you have questions after your visit or need to cancel or reschedule your appointment, please contact [facility] - A DEPT OF [facility]  Dept: [phone]  and follow the prompts.  Office hours are 8:00 a.m. to 4:30 p.m. Monday - Friday. Please note that voicemails left after 4:00 p.m. may not be returned until the following business day.  We are closed weekends and major holidays. You have access to a nurse at all times for urgent questions. Please call the main number to the clinic Dept: [phone] and follow the prompts.   For any non-urgent questions, you may also contact your provider using MyChart. We now offer e-Visits for anyone 58 and older to request care online for non-urgent symptoms. For details visit [email or web address].   Also download the MyChart app! Go to the app store, search "MyChart", open the app, select , and log in with your MyChart username and password.`

## 2023-06-24 NOTE — Assessment & Plan Note (Addendum)
 Magic mouthwash as needed Use clobetasol gel as needed

## 2023-06-25 ENCOUNTER — Inpatient Hospital Stay

## 2023-06-25 VITALS — BP 124/73 | HR 74 | Temp 98.0°F | Resp 18

## 2023-06-25 DIAGNOSIS — K1230 Oral mucositis (ulcerative), unspecified: Secondary | ICD-10-CM | POA: Diagnosis not present

## 2023-06-25 DIAGNOSIS — Z8546 Personal history of malignant neoplasm of prostate: Secondary | ICD-10-CM

## 2023-06-25 DIAGNOSIS — D6481 Anemia due to antineoplastic chemotherapy: Secondary | ICD-10-CM | POA: Diagnosis not present

## 2023-06-25 DIAGNOSIS — Z5111 Encounter for antineoplastic chemotherapy: Secondary | ICD-10-CM | POA: Diagnosis not present

## 2023-06-25 DIAGNOSIS — Z79899 Other long term (current) drug therapy: Secondary | ICD-10-CM | POA: Diagnosis not present

## 2023-06-25 DIAGNOSIS — N3000 Acute cystitis without hematuria: Secondary | ICD-10-CM | POA: Diagnosis not present

## 2023-06-25 DIAGNOSIS — C7951 Secondary malignant neoplasm of bone: Secondary | ICD-10-CM | POA: Diagnosis not present

## 2023-06-25 LAB — TESTOSTERONE: Testosterone: <3 ng/dL — ABNORMAL LOW (ref 264–916)

## 2023-06-25 LAB — PROSTATE-SPECIFIC AG, SERUM (LABCORP): Prostate Specific Ag, Serum: 0.2 ng/mL (ref 0.0–4.0)

## 2023-06-25 MED ORDER — PEGFILGRASTIM-CBQV 6 MG/0.6ML ~~LOC~~ SOSY
6.0000 mg | PREFILLED_SYRINGE | Freq: Once | SUBCUTANEOUS | Status: AC
  Administered 2023-06-25: 6 mg via SUBCUTANEOUS
  Filled 2023-06-25: qty 0.6

## 2023-06-26 ENCOUNTER — Other Ambulatory Visit: Payer: Self-pay

## 2023-06-26 ENCOUNTER — Other Ambulatory Visit (HOSPITAL_COMMUNITY): Payer: Self-pay

## 2023-06-26 ENCOUNTER — Ambulatory Visit: Payer: Medicare Other

## 2023-06-26 NOTE — Progress Notes (Signed)
 Specialty Pharmacy Refill Coordination Note  [facility] is a 73 y.o. male contacted today regarding refills of specialty medication(s) Nubeqa & Orgovyx.  Patient requested (Patient-Rptd) Delivery   Delivery date: (Patient-Rptd) 06/30/23   Verified address: (Patient-Rptd) [address] [place], [place] [address]   Medication will be filled on 06/27/23.

## 2023-06-27 ENCOUNTER — Other Ambulatory Visit: Payer: Self-pay

## 2023-07-01 DIAGNOSIS — H903 Sensorineural hearing loss, bilateral: Secondary | ICD-10-CM | POA: Diagnosis not present

## 2023-07-02 ENCOUNTER — Telehealth: Payer: Self-pay | Admitting: Genetic Counselor

## 2023-07-03 ENCOUNTER — Ambulatory Visit: Payer: Self-pay | Admitting: Genetic Counselor

## 2023-07-03 DIAGNOSIS — Z1379 Encounter for other screening for genetic and chromosomal anomalies: Secondary | ICD-10-CM | POA: Insufficient documentation

## 2023-07-03 NOTE — Progress Notes (Signed)
 HPI:  Mr. Johnny Davenport was previously seen in the [facility] due to a personal and family history of cancer and concerns regarding a hereditary predisposition to cancer. Please refer to our prior cancer genetics clinic note for more information regarding our discussion, assessment and recommendations, at the time. Mr. Johnny Davenport recent genetic test results were disclosed to him by phone on 07/03/23, as were recommendations warranted by these results. These results and recommendations are discussed in more detail below.  CANCER HISTORY:  Oncology History  History of prostate cancer  05/06/2022 Cancer Staging   Staging form: Prostate, AJCC 8th Edition - Clinical stage from 05/06/2022: Stage IIB (cT2, cN0, cM0, PSA: 7.3, Grade Group: 2) - Signed by Keitha Pata, PA-C on 07/04/2022 Histopathologic type: Adenocarcinoma, NOS Stage prefix: Initial diagnosis Prostate specific antigen (PSA) range: Less than 10 Gleason primary pattern: 3 Gleason secondary pattern: 4 Gleason score: 7 Histologic grading system: 5 grade system Number of biopsy cores examined: 12 Number of biopsy cores positive: 4 Location of positive needle core biopsies: One side   05/28/2022 Initial Diagnosis   Malignant neoplasm of prostate   03/27/2023 Imaging   MRI L Hip Heterogeneous osseous metastasis involving the pelvis involving the L4 and L5 vertebral bodies Probable extraosseous extension and small pathologic fracture of left ischial tuberosity with cortical disruption laterally.  Moderate sized partial-thickness tear of the adjacent left hamstring tendon origin Bone marrow edema surrounding the left SI joint with mild edema type signal along the superior margin of the left piriformis muscle.  Finding could be secondary to extraosseous extension of metastasis or represent posttraumatic change Small partial-thickness tear in the left gluteus muscle minimus tendon at the insertion on the greater trochanter with  associated reactive bone marrow edema in the superior greater trochanter. Small low signal intensity focus along the insertional fibers of the left gluteus tendons which could represent calcium  hydroxyapatite deposit Mild osteoarthritis of both. Possible enlarged left internal iliac/perirectal lymph node measuring 1.8 cm   04/08/2023 PET scan   PSMA PET 1. Extensive radiotracer avid skeletal metastasis involving the axillary and appendicular skeleton. 2. Extensive radiotracer avid lymph node metastasis in the pelvis, retroperitoneum, and mediastinum. 3. Multiple radiotracer avid small pulmonary metastasis.   04/10/2023 Tumor Marker   PSA 107   05/14/2023 -  Chemotherapy   Patient is on Treatment Plan : PROSTATE Docetaxel  (73) q21d      Genetic Testing   Negative CancerNext-Expanded +RNAinsight panel. The CancerNext-Expanded gene panel offered by [facility] and includes sequencing, rearrangement, and RNA analysis for the following 76 genes: AIP, ALK, APC, ATM, AXIN2, BAP1, BARD1, BMPR1A, BRCA1, BRCA2, BRIP1, CDC73, CDH1, CDK4, CDKN1B, CDKN2A, CEBPA, CHEK2, CTNNA1, DDX41, DICER1, ETV6, FH, FLCN, GATA2, LZTR1, MAX, MBD4, MEN1, MET, MLH1, MSH2, MSH3, MSH6, MUTYH, NF1, NF2, NTHL1, PALB2, PHOX2B, PMS2, POT1, PRKAR1A, PTCH1, PTEN, RAD51C, RAD51D, RB1, RET, RUNX1, SDHA, SDHAF2, SDHB, SDHC, SDHD, SMAD4, SMARCA4, SMARCB1, SMARCE1, STK11, SUFU, TMEM127, TP53, TSC1, TSC2, VHL, and WT1 (sequencing and deletion/duplication); EGFR, HOXB13, KIT, MITF, PDGFRA, POLD1, and POLE (sequencing only); EPCAM and GREM1 (deletion/duplication only). Report date 06/28/23.    Prostate cancer metastatic to multiple sites [facility])  04/08/2023 PET scan   PSMA PET 1. Extensive radiotracer avid skeletal metastasis involving the axillary and appendicular skeleton. 2. Extensive radiotracer avid lymph node metastasis in the pelvis, retroperitoneum, and mediastinum. 3. Multiple radiotracer avid small pulmonary metastasis.    04/09/2023 Initial Diagnosis   Prostate cancer metastatic to multiple sites [facility])   04/10/2023 Tumor  Marker   PSA 107   04/10/2023 Cancer Staging   Staging form: Prostate, AJCC 8th Edition - Clinical stage from 04/10/2023: Stage IVB (rcT0, cN1, cM1c, PSA: 107, Grade Group: 2) - Signed by Kenith Payer, MD on 04/11/2023 Stage prefix: Recurrence Prostate specific antigen (PSA) range: 20 or greater Gleason score: 7 Histologic grading system: 5 grade system     FAMILY HISTORY:  We obtained a detailed, 4-generation family history.  Significant diagnoses are listed below: Family History  Problem Relation Age of Onset   Stroke Mother 25   Healthy Sister    Healthy Sister    Leukemia Cousin 15   Breast cancer Cousin 101   Lymphoma Cousin    Bladder Cancer Cousin 54   Breast cancer Other    Colon cancer Neg Hx    Colon polyps Neg Hx    Esophageal cancer Neg Hx    Stomach cancer Neg Hx    Rectal cancer Neg Hx    Mr. Johnny Davenport reports his maternal grandfather was diagnosed with lung cancer and passed away in his 66s. He reports his maternal grandmother was diagnosed with stomach cancer and passed in her 44s. He reports a maternal first cousin diagnosed with breast cancer at 12, a maternal first cousin diagnosed with leukemia at age 68, and a maternal first cousin diagnosed with lymphoma at 43. He reports a paternal first cousin diagnosed with bladder cancer at 39 and a paternal first cousin once removed diagnosed with breast cancer that passed away at 28.     GENETIC TEST RESULTS: Genetic testing reported out on 06/28/23 through the [facility] cancer panel found no pathogenic mutations. The CancerNext-Expanded gene panel offered by [facility] and includes sequencing, rearrangement, and RNA analysis for the following 76 genes: AIP, ALK, APC, ATM, AXIN2, BAP1, BARD1, BMPR1A, BRCA1, BRCA2, BRIP1, CDC73, CDH1, CDK4, CDKN1B, CDKN2A, CEBPA, CHEK2, CTNNA1, DDX41, DICER1, ETV6, FH, FLCN, GATA2, LZTR1,  MAX, MBD4, MEN1, MET, MLH1, MSH2, MSH3, MSH6, MUTYH, NF1, NF2, NTHL1, PALB2, PHOX2B, PMS2, POT1, PRKAR1A, PTCH1, PTEN, RAD51C, RAD51D, RB1, RET, RUNX1, SDHA, SDHAF2, SDHB, SDHC, SDHD, SMAD4, SMARCA4, SMARCB1, SMARCE1, STK11, SUFU, TMEM127, TP53, TSC1, TSC2, VHL, and WT1 (sequencing and deletion/duplication); EGFR, HOXB13, KIT, MITF, PDGFRA, POLD1, and POLE (sequencing only); EPCAM and GREM1 (deletion/duplication only). The test report has been scanned into EPIC and is located under the Molecular Pathology section of the Results Review tab.  A portion of the result report is included below for reference.     We discussed with Mr. Johnny Davenport that because current genetic testing is not perfect, it is possible there may be a gene mutation in one of these genes that current testing cannot detect, but that chance is small.  We also discussed, that there could be another gene that has not yet been discovered, or that we have not yet tested, that is responsible for the cancer diagnoses in the family. It is also possible there is a hereditary cause for the cancer in the family that Mr. Johnny Davenport did not inherit and therefore was not identified in his testing.  Therefore, it is important to remain in touch with cancer genetics in the future so that we can continue to offer Mr. Johnny Davenport the most up to date genetic testing.   ADDITIONAL GENETIC TESTING: We discussed with Mr. Johnny Davenport that his genetic testing was fairly extensive.  If there are genes identified to increase cancer risk that can be analyzed in the future, we would be happy to discuss and coordinate this  testing at that time.    CANCER SCREENING RECOMMENDATIONS: Mr. Johnny Davenport test result is considered negative (normal).  This means that we have not identified a hereditary cause for his personal and family history of cancer at this time. Most cancers happen by chance and this negative test suggests that his personal and family history of cancer may fall into this category.     Possible reasons for Mr. Johnny Davenport negative genetic test include:  1. There may be a gene mutation in one of these genes that current testing methods cannot detect but that chance is small.  2. There could be another gene that has not yet been discovered, or that we have not yet tested, that is responsible for the cancer diagnoses in the family.  3.  There may be no hereditary risk for cancer in the family. The cancers in Mr. Johnny Davenport and/or his family may be sporadic/familial or due to other genetic and environmental factors. 4. It is also possible there is a hereditary cause for the cancer in the family that Mr. Johnny Davenport did not inherit.  Therefore, it is recommended he continue to follow the cancer management and screening guidelines provided by his oncology and primary healthcare provider. An individual's cancer risk and medical management are not determined by genetic test results alone. Overall cancer risk assessment incorporates additional factors, including personal medical history, family history, and any available genetic information that may result in a personalized plan for cancer prevention and surveillance  Given Mr. Johnny Davenport personal and family histories, we must interpret these negative results with some caution.  Families with features suggestive of hereditary risk for cancer tend to have multiple family members with cancer, diagnoses in multiple generations and diagnoses before the age of 24. Mr. Johnny Davenport family exhibits some of these features. Thus, this result may simply reflect our current inability to detect all mutations within these genes or there may be a different gene that has not yet been discovered or tested.   An individual's cancer risk and medical management are not determined by genetic test results alone. Overall cancer risk assessment incorporates additional factors, including personal medical history, family history, and any available genetic information that may result in a  personalized plan for cancer prevention and surveillance.  RECOMMENDATIONS FOR FAMILY MEMBERS:  Individuals in this family might be at some increased risk of developing cancer, over the general population risk, simply due to the family history of cancer.  We recommended women in this family have a yearly mammogram beginning at age 87, or 3 years younger than the earliest onset of cancer, an annual clinical breast exam, and perform monthly breast self-exams. Women in this family should also have a gynecological exam as recommended by their primary provider. All family members should be referred for colonoscopy starting at age 49, or 89 years younger than the earliest onset of cancer.  FOLLOW-UP: Lastly, we discussed with Mr. Johnny Davenport that cancer genetics is a rapidly advancing field and it is possible that new genetic tests will be appropriate for him and/or his family members in the future. We encouraged him to remain in contact with cancer genetics on an annual basis so we can update his personal and family histories and let him know of advances in cancer genetics that may benefit this family.   Our contact number was provided. Mr. Johnny Davenport questions were answered to his satisfaction, and he knows he is welcome to call us  at anytime with additional questions or concerns.   Jobie Mulders, MS,  [facility] Licensed, Retail [email or web address] [phone]

## 2023-07-03 NOTE — Telephone Encounter (Signed)
 I spoke to Johnny Davenport to review results of genetic testing. he had genetic testing with [facility]'s CancerNext-Expanded +RNAinsight panel. Testing did not identify any variants known to increase the risk for cancer.  Discussed that we do not know why he has prostate cancer or why there is cancer in the family. It could be due to a different gene that we are not testing, or maybe our current technology may not be able to pick something up.  It will be important for him to keep in contact with genetics to keep up with whether additional testing may be needed.  Please see counseling note for further detail on this result.

## 2023-07-04 NOTE — Progress Notes (Signed)
 Patient continues with increased urinary side effects.   Patient recently treated for UTI with Levofloxacin . He does have a history of BPH and is currently taking Silodosin 8 mg.  He is reporting having to get up several times throughout the night to go to the restroom, and feels like he is not emptying his bladder when he does go. Difficulty starting stream. Symptoms subside during the day. Denies any abdominal discomfort or distention.   RN reviewed with MD for additional recommendations.  Since this is no longer concerns with UTI recommendations for patient to see urology.  Patient is now scheduled for urology appointment on Tuesday 4/29 at 10am.  Patient aware.  RN encouraged patient to utilize anti inflammatories, and if symptoms worsen or he is unable to void to utilize ED.  Patient verbalized understanding.

## 2023-07-08 DIAGNOSIS — C7951 Secondary malignant neoplasm of bone: Secondary | ICD-10-CM | POA: Diagnosis not present

## 2023-07-08 DIAGNOSIS — R350 Frequency of micturition: Secondary | ICD-10-CM | POA: Diagnosis not present

## 2023-07-10 NOTE — Progress Notes (Signed)
 RN spoke with patient to follow up after recent visit with urology.  Patient was prescribed Vesicare to help with urinary symptoms, patient started yesterday 4/30.    Patient is scheduled for cycle 4 on 5/6.  RN will provide information about support group during his appointment.

## 2023-07-14 ENCOUNTER — Other Ambulatory Visit: Payer: Self-pay

## 2023-07-14 DIAGNOSIS — R399 Unspecified symptoms and signs involving the genitourinary system: Secondary | ICD-10-CM

## 2023-07-14 DIAGNOSIS — C61 Malignant neoplasm of prostate: Secondary | ICD-10-CM

## 2023-07-14 DIAGNOSIS — Z8546 Personal history of malignant neoplasm of prostate: Secondary | ICD-10-CM

## 2023-07-15 ENCOUNTER — Inpatient Hospital Stay: Payer: Medicare Other

## 2023-07-15 ENCOUNTER — Inpatient Hospital Stay: Admitting: Dietician

## 2023-07-15 VITALS — BP 119/71 | HR 74 | Resp 16

## 2023-07-15 VITALS — BP 130/78 | HR 86 | Temp 97.9°F | Resp 17 | Wt 184.1 lb

## 2023-07-15 DIAGNOSIS — N179 Acute kidney failure, unspecified: Secondary | ICD-10-CM | POA: Insufficient documentation

## 2023-07-15 DIAGNOSIS — D6481 Anemia due to antineoplastic chemotherapy: Secondary | ICD-10-CM | POA: Insufficient documentation

## 2023-07-15 DIAGNOSIS — C7951 Secondary malignant neoplasm of bone: Secondary | ICD-10-CM | POA: Insufficient documentation

## 2023-07-15 DIAGNOSIS — C61 Malignant neoplasm of prostate: Secondary | ICD-10-CM

## 2023-07-15 DIAGNOSIS — T451X5A Adverse effect of antineoplastic and immunosuppressive drugs, initial encounter: Secondary | ICD-10-CM | POA: Diagnosis not present

## 2023-07-15 DIAGNOSIS — Z1379 Encounter for other screening for genetic and chromosomal anomalies: Secondary | ICD-10-CM | POA: Diagnosis not present

## 2023-07-15 DIAGNOSIS — N3281 Overactive bladder: Secondary | ICD-10-CM

## 2023-07-15 DIAGNOSIS — Z8546 Personal history of malignant neoplasm of prostate: Secondary | ICD-10-CM

## 2023-07-15 DIAGNOSIS — Z79899 Other long term (current) drug therapy: Secondary | ICD-10-CM | POA: Insufficient documentation

## 2023-07-15 DIAGNOSIS — Z5111 Encounter for antineoplastic chemotherapy: Secondary | ICD-10-CM | POA: Insufficient documentation

## 2023-07-15 LAB — CMP (CANCER CENTER ONLY)
ALT: 9 U/L (ref 0–44)
AST: 13 U/L — ABNORMAL LOW (ref 15–41)
Albumin: 3.7 g/dL (ref 3.5–5.0)
Alkaline Phosphatase: 90 U/L (ref 38–126)
Anion gap: 6 (ref 5–15)
BUN: 21 mg/dL (ref 8–23)
CO2: 26 mmol/L (ref 22–32)
Calcium: 9.1 mg/dL (ref 8.9–10.3)
Chloride: 105 mmol/L (ref 98–111)
Creatinine: 0.9 mg/dL (ref 0.61–1.24)
GFR, Estimated: >60 mL/min (ref >60)
Glucose, Bld: 98 mg/dL (ref 70–99)
Potassium: 4.2 mmol/L (ref 3.5–5.1)
Sodium: 137 mmol/L (ref 135–145)
Total Bilirubin: 0.3 mg/dL (ref 0.0–1.2)
Total Protein: 6.6 g/dL (ref 6.5–8.1)

## 2023-07-15 LAB — CBC WITH DIFFERENTIAL (CANCER CENTER ONLY)
Abs Immature Granulocytes: 0.04 10*3/uL (ref 0.00–0.07)
Basophils Absolute: 0 10*3/uL (ref 0.0–0.1)
Basophils Relative: 0 %
Eosinophils Absolute: 0 10*3/uL (ref 0.0–0.5)
Eosinophils Relative: 0 %
HCT: 27.2 % — ABNORMAL LOW (ref 39.0–52.0)
Hemoglobin: 8.9 g/dL — ABNORMAL LOW (ref 13.0–17.0)
Immature Granulocytes: 1 %
Lymphocytes Relative: 6 %
Lymphs Abs: 0.6 10*3/uL — ABNORMAL LOW (ref 0.7–4.0)
MCH: 31.3 pg (ref 26.0–34.0)
MCHC: 32.7 g/dL (ref 30.0–36.0)
MCV: 95.8 fL (ref 80.0–100.0)
Monocytes Absolute: 0.8 10*3/uL (ref 0.1–1.0)
Monocytes Relative: 9 %
Neutro Abs: 7.4 10*3/uL (ref 1.7–7.7)
Neutrophils Relative %: 84 %
Platelet Count: 413 10*3/uL — ABNORMAL HIGH (ref 150–400)
RBC: 2.84 MIL/uL — ABNORMAL LOW (ref 4.22–5.81)
RDW: 17.2 % — ABNORMAL HIGH (ref 11.5–15.5)
WBC Count: 8.9 10*3/uL (ref 4.0–10.5)
nRBC: 0 % (ref 0.0–0.2)

## 2023-07-15 LAB — LACTATE DEHYDROGENASE: LDH: 142 U/L (ref 98–192)

## 2023-07-15 MED ORDER — SODIUM CHLORIDE 0.9 % IV SOLN
75.0000 mg/m2 | Freq: Once | INTRAVENOUS | Status: AC
  Administered 2023-07-15: 160 mg via INTRAVENOUS
  Filled 2023-07-15: qty 16

## 2023-07-15 MED ORDER — SODIUM CHLORIDE 0.9 % IV SOLN: INTRAVENOUS | Status: DC

## 2023-07-15 MED ORDER — DEXAMETHASONE SODIUM PHOSPHATE 10 MG/ML IJ SOLN
10.0000 mg | Freq: Once | INTRAMUSCULAR | Status: AC
  Administered 2023-07-15: 10 mg via INTRAVENOUS
  Filled 2023-07-15: qty 1

## 2023-07-15 NOTE — Assessment & Plan Note (Addendum)
 Cycle 4 of docetaxel  We will follow-up PSA each cycle Continue Relugolix  and darolutamide 

## 2023-07-15 NOTE — Assessment & Plan Note (Addendum)
 Negative CancerNext-Expanded +RNAinsight panel.

## 2023-07-15 NOTE — Assessment & Plan Note (Addendum)
 Slightly worse this week. Will need lab monitoring every cycle

## 2023-07-15 NOTE — Progress Notes (Signed)
 Nutrition Follow-up:  Pt with prostate cancer metastatic to bone, lung, and lymph nodes. S/p brachytherapy (08/2022). Patient will completed palliative radiation under the care of Dr. Lorri Rota on 2/14. Currently receiving chemotherapy with docetaxel  q21d (first 3/5) + Nubeqa  600 mg/day. Pt followed by Dr. Alita Irwin   Met with patient and wife in infusion. Patient reports tolerating treatment well overall other than some fatigue secondary to urinary frequency. Endorses recurrent mouth sores after treatment. Mouth rinses and salve have been working well. He has a good appetite. Eating several small meals and including good sources of protein. He reports large hard painful bowel movements that cause pain when sitting. Patient having up to 3 stools daily. Patient taking dulcolax 3x/day. Adding benefiber to water  as well as fiber buds to bran cereal. He drinks ~70 ounces of water . Patient walking 3-5 miles daily.    Medications: reviewed   Labs: Hgb 8.9  Anthropometrics: Wt 184 lb 1.6 oz today - stable  4/15 - 184 lb 9.6 oz 3/25 - 182 lb 6 oz 3/4 - 182 lb 12.8 oz    NUTRITION DIAGNOSIS: Food and nutrition related knowledge deficit improved    INTERVENTION:  Continue strategies for increasing calories and protein with small frequent meals/snacks Given high dietary fiber per recall, suggested d/c benefiber - pt could try adding otc stool softener Continue activity as able    MONITORING, EVALUATION, GOAL: wt trends, intake   NEXT VISIT: Tuesday June 17 during infusion

## 2023-07-15 NOTE — Patient Instructions (Signed)
 [facility] - A DEPT OF [facility]  Discharge Instructions: Thank you for choosing [facility] to provide your oncology and hematology care.   If you have a lab appointment with the Cancer Center, please go directly to the Cancer Center and check in at the registration area.   Wear comfortable clothing and clothing appropriate for easy access to any Portacath or PICC line.   We strive to give you quality time with your provider. You may need to reschedule your appointment if you arrive late (15 or more minutes).  Arriving late affects you and other patients whose appointments are after yours.  Also, if you miss three or more appointments without notifying the office, you may be dismissed from the clinic at the provider's discretion.      For prescription refill requests, have your pharmacy contact our office and allow 72 hours for refills to be completed.    Today you received the following chemotherapy and/or immunotherapy agents: Docetaxel      To help prevent nausea and vomiting after your treatment, we encourage you to take your nausea medication as directed.  BELOW ARE SYMPTOMS THAT SHOULD BE REPORTED IMMEDIATELY: *FEVER GREATER THAN 100.4 F (38 C) OR HIGHER *CHILLS OR SWEATING *NAUSEA AND VOMITING THAT IS NOT CONTROLLED WITH YOUR NAUSEA MEDICATION *UNUSUAL SHORTNESS OF BREATH *UNUSUAL BRUISING OR BLEEDING *URINARY PROBLEMS (pain or burning when urinating, or frequent urination) *BOWEL PROBLEMS (unusual diarrhea, constipation, pain near the anus) TENDERNESS IN MOUTH AND THROAT WITH OR WITHOUT PRESENCE OF ULCERS (sore throat, sores in mouth, or a toothache) UNUSUAL RASH, SWELLING OR PAIN  UNUSUAL VAGINAL DISCHARGE OR ITCHING   Items with * indicate a potential emergency and should be followed up as soon as possible or go to the Emergency Department if any problems should occur.  Please show the CHEMOTHERAPY ALERT CARD or IMMUNOTHERAPY  ALERT CARD at check-in to the Emergency Department and triage nurse.  Should you have questions after your visit or need to cancel or reschedule your appointment, please contact [facility] - A DEPT OF [facility]  Dept: [phone]  and follow the prompts.  Office hours are 8:00 a.m. to 4:30 p.m. Monday - Friday. Please note that voicemails left after 4:00 p.m. may not be returned until the following business day.  We are closed weekends and major holidays. You have access to a nurse at all times for urgent questions. Please call the main number to the clinic Dept: [phone] and follow the prompts.   For any non-urgent questions, you may also contact your provider using MyChart. We now offer e-Visits for anyone 58 and older to request care online for non-urgent symptoms. For details visit [email or web address].   Also download the MyChart app! Go to the app store, search "MyChart", open the app, select , and log in with your MyChart username and password.`

## 2023-07-15 NOTE — Progress Notes (Signed)
 [facility] OFFICE PROGRESS NOTE  Patient Care Team: Copland, Skipper Dumas, MD as PCP - General (Family Medicine) Katheleen Palmer, RN as Oncology Nurse Navigator Manny, Harvey Linen., MD as Consulting Physician (Urology) Kenith Payer, MD as Consulting Physician (Radiation Oncology) Neda Balk, RN as Registered Nurse Debbie Fails, Laura Polio, NP as Nurse Practitioner (Hematology and Oncology)  Johnny Davenport is a 73 y.o.male with history of BPH, arthritis, hyperlipidemia, prostate cancer [facility] prostate cancer.  Currently on active treatment with docetaxel  triplet therapy.   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  Germline testing: negative. Somatic testing: negative for MMR/MSI high. PD-L1 TPS 1%.  Treatment: s/p brachytherapy in 08/2022  Current treatment: Triple therapy with docetaxel , Relugolix  and darolutamide .  He gets monthly shipment of daro and relugolix  to his house.   PSA trending down.   Anemia from chemotherapy worse this week. Continue monitor for side effects. Feels well to get cycle 4 today. Assessment & Plan Prostate cancer metastatic to multiple sites [facility]) Cycle 4 of docetaxel  We will follow-up PSA each cycle Continue Relugolix  and darolutamide  Genetic testing Negative CancerNext-Expanded +RNAinsight panel.  Anemia due to antineoplastic chemotherapy Slightly worse this week. Will need lab monitoring every cycle Overactive bladder Started vesicare.  Follow up for cycle 5 as scheduled.   Lowanda Ruddy, MD  INTERVAL HISTORY: Patient returns for follow-up. Appetite is good and eating and drinking enough fluid.   He started vesicare it helped. Stool is hard and started dulcolax. Adding fiber in the food. No nausea or vomiting or stomach pain. No fever, chills. Azo helped with  pain on urination.  Oncology History  History of prostate cancer  05/06/2022 Cancer Staging   Staging form: Prostate, AJCC 8th Edition - Clinical stage from 05/06/2022: Stage IIB (cT2, cN0, cM0, PSA: 7.3, Grade Group: 2) - Signed by Keitha Pata, PA-C on 07/04/2022 Histopathologic type: Adenocarcinoma, NOS Stage prefix: Initial diagnosis Prostate specific antigen (PSA) range: Less than 10 Gleason primary pattern: 3 Gleason secondary pattern: 4 Gleason score: 7 Histologic grading system: 5 grade system Number of biopsy cores examined: 12 Number of biopsy cores positive: 4 Location of positive needle core biopsies: One side   05/28/2022 Initial Diagnosis   Malignant neoplasm of prostate   03/27/2023 Imaging   MRI L Hip Heterogeneous osseous metastasis involving the pelvis involving the L4 and L5 vertebral bodies Probable extraosseous extension and small pathologic fracture of left ischial tuberosity with cortical disruption laterally.  Moderate sized partial-thickness tear of the adjacent left hamstring tendon origin Bone marrow edema surrounding the left SI joint with mild edema type signal along the superior margin of the left piriformis muscle.  Finding could be secondary to extraosseous extension of metastasis or represent posttraumatic change Small partial-thickness tear in the left gluteus muscle minimus tendon at the insertion on the greater trochanter with associated reactive bone marrow edema in the superior greater trochanter. Small low signal intensity focus along the insertional fibers of the left gluteus tendons which could represent calcium  hydroxyapatite deposit Mild osteoarthritis of both. Possible enlarged left internal iliac/perirectal lymph node measuring 1.8 cm   04/08/2023 PET scan   PSMA PET 1. Extensive radiotracer avid skeletal metastasis involving the axillary and appendicular skeleton. 2. Extensive radiotracer avid lymph node metastasis in the  pelvis, retroperitoneum, and mediastinum. 3. Multiple radiotracer avid small  pulmonary metastasis.   04/10/2023 Tumor Marker   PSA 107   05/14/2023 -  Chemotherapy   Patient is on Treatment Plan : PROSTATE Docetaxel  (73) q21d      Genetic Testing   Negative CancerNext-Expanded +RNAinsight panel. The CancerNext-Expanded gene panel offered by [facility] and includes sequencing, rearrangement, and RNA analysis for the following 76 genes: AIP, ALK, APC, ATM, AXIN2, BAP1, BARD1, BMPR1A, BRCA1, BRCA2, BRIP1, CDC73, CDH1, CDK4, CDKN1B, CDKN2A, CEBPA, CHEK2, CTNNA1, DDX41, DICER1, ETV6, FH, FLCN, GATA2, LZTR1, MAX, MBD4, MEN1, MET, MLH1, MSH2, MSH3, MSH6, MUTYH, NF1, NF2, NTHL1, PALB2, PHOX2B, PMS2, POT1, PRKAR1A, PTCH1, PTEN, RAD51C, RAD51D, RB1, RET, RUNX1, SDHA, SDHAF2, SDHB, SDHC, SDHD, SMAD4, SMARCA4, SMARCB1, SMARCE1, STK11, SUFU, TMEM127, TP53, TSC1, TSC2, VHL, and WT1 (sequencing and deletion/duplication); EGFR, HOXB13, KIT, MITF, PDGFRA, POLD1, and POLE (sequencing only); EPCAM and GREM1 (deletion/duplication only). Report date 06/28/23.    Prostate cancer metastatic to multiple sites [facility])  04/08/2023 PET scan   PSMA PET 1. Extensive radiotracer avid skeletal metastasis involving the axillary and appendicular skeleton. 2. Extensive radiotracer avid lymph node metastasis in the pelvis, retroperitoneum, and mediastinum. 3. Multiple radiotracer avid small pulmonary metastasis.   04/09/2023 Initial Diagnosis   Prostate cancer metastatic to multiple sites [facility])   04/10/2023 Tumor Marker   PSA 107   04/10/2023 Cancer Staging   Staging form: Prostate, AJCC 8th Edition - Clinical stage from 04/10/2023: Stage IVB (rcT0, cN1, cM1c, PSA: 107, Grade Group: 2) - Signed by Kenith Payer, MD on 04/11/2023 Stage prefix: Recurrence Prostate specific antigen (PSA) range: 20 or greater Gleason score: 7 Histologic grading system: 5 grade system      PHYSICAL EXAMINATION: ECOG PERFORMANCE STATUS: 0  - Asymptomatic  Vitals:   07/15/23 0844  BP: 130/78  Pulse: 86  Resp: 17  Temp: 97.9 F (36.6 C)  SpO2: 100%   Filed Weights   07/15/23 0844  Weight: 184 lb 1.6 oz (83.5 kg)    GENERAL: alert, no distress and comfortable SKIN: skin color normal and no bruising or petechiae or jaundice on exposed skin EYES:  sclera clear LUNGS: clear to auscultation and percussion with normal breathing effort HEART: regular rate & rhythm  ABDOMEN: abdomen soft, non-tender and nondistended. Musculoskeletal: no edema  Relevant data reviewed during this visit included labs.

## 2023-07-15 NOTE — Assessment & Plan Note (Addendum)
 Started vesicare.

## 2023-07-16 LAB — PROSTATE-SPECIFIC AG, SERUM (LABCORP): Prostate Specific Ag, Serum: 0.1 ng/mL (ref 0.0–4.0)

## 2023-07-16 LAB — TESTOSTERONE: Testosterone: <3 ng/dL — ABNORMAL LOW (ref 264–916)

## 2023-07-17 ENCOUNTER — Inpatient Hospital Stay: Payer: Medicare Other

## 2023-07-17 VITALS — BP 120/69 | HR 81 | Temp 98.2°F | Resp 16

## 2023-07-17 DIAGNOSIS — D6481 Anemia due to antineoplastic chemotherapy: Secondary | ICD-10-CM | POA: Diagnosis not present

## 2023-07-17 DIAGNOSIS — C7951 Secondary malignant neoplasm of bone: Secondary | ICD-10-CM | POA: Diagnosis not present

## 2023-07-17 DIAGNOSIS — Z79899 Other long term (current) drug therapy: Secondary | ICD-10-CM | POA: Diagnosis not present

## 2023-07-17 DIAGNOSIS — Z8546 Personal history of malignant neoplasm of prostate: Secondary | ICD-10-CM

## 2023-07-17 DIAGNOSIS — N179 Acute kidney failure, unspecified: Secondary | ICD-10-CM | POA: Diagnosis not present

## 2023-07-17 DIAGNOSIS — Z5111 Encounter for antineoplastic chemotherapy: Secondary | ICD-10-CM | POA: Diagnosis not present

## 2023-07-17 MED ORDER — PEGFILGRASTIM-CBQV 6 MG/0.6ML ~~LOC~~ SOSY
6.0000 mg | PREFILLED_SYRINGE | Freq: Once | SUBCUTANEOUS | Status: AC
  Administered 2023-07-17: 6 mg via SUBCUTANEOUS
  Filled 2023-07-17: qty 0.6

## 2023-07-23 ENCOUNTER — Emergency Department (HOSPITAL_COMMUNITY)

## 2023-07-23 ENCOUNTER — Other Ambulatory Visit: Payer: Self-pay

## 2023-07-23 ENCOUNTER — Telehealth: Payer: Self-pay

## 2023-07-23 ENCOUNTER — Encounter (HOSPITAL_COMMUNITY): Payer: Self-pay

## 2023-07-23 ENCOUNTER — Emergency Department (HOSPITAL_COMMUNITY)
Admission: EM | Admit: 2023-07-23 | Discharge: 2023-07-23 | Disposition: A | Discharge status: Home / Self Care | Attending: Emergency Medicine | Admitting: Emergency Medicine

## 2023-07-23 ENCOUNTER — Inpatient Hospital Stay

## 2023-07-23 DIAGNOSIS — R0602 Shortness of breath: Secondary | ICD-10-CM | POA: Diagnosis not present

## 2023-07-23 DIAGNOSIS — R0600 Dyspnea, unspecified: Secondary | ICD-10-CM | POA: Diagnosis not present

## 2023-07-23 DIAGNOSIS — R059 Cough, unspecified: Secondary | ICD-10-CM | POA: Insufficient documentation

## 2023-07-23 DIAGNOSIS — R3915 Urgency of urination: Secondary | ICD-10-CM | POA: Insufficient documentation

## 2023-07-23 DIAGNOSIS — I2510 Atherosclerotic heart disease of native coronary artery without angina pectoris: Secondary | ICD-10-CM | POA: Diagnosis not present

## 2023-07-23 DIAGNOSIS — Z8546 Personal history of malignant neoplasm of prostate: Secondary | ICD-10-CM | POA: Insufficient documentation

## 2023-07-23 DIAGNOSIS — C7951 Secondary malignant neoplasm of bone: Secondary | ICD-10-CM | POA: Diagnosis not present

## 2023-07-23 DIAGNOSIS — C61 Malignant neoplasm of prostate: Secondary | ICD-10-CM

## 2023-07-23 DIAGNOSIS — R5383 Other fatigue: Secondary | ICD-10-CM | POA: Diagnosis not present

## 2023-07-23 DIAGNOSIS — I7121 Aneurysm of the ascending aorta, without rupture: Secondary | ICD-10-CM | POA: Diagnosis not present

## 2023-07-23 DIAGNOSIS — C7800 Secondary malignant neoplasm of unspecified lung: Secondary | ICD-10-CM | POA: Diagnosis not present

## 2023-07-23 DIAGNOSIS — C801 Malignant (primary) neoplasm, unspecified: Secondary | ICD-10-CM | POA: Diagnosis not present

## 2023-07-23 LAB — CBC WITH DIFFERENTIAL (CANCER CENTER ONLY)
Abs Immature Granulocytes: 2.71 10*3/uL — ABNORMAL HIGH (ref 0.00–0.07)
Basophils Absolute: 0 10*3/uL (ref 0.0–0.1)
Basophils Relative: 0 %
Eosinophils Absolute: 0.2 10*3/uL (ref 0.0–0.5)
Eosinophils Relative: 1 %
HCT: 27.6 % — ABNORMAL LOW (ref 39.0–52.0)
Hemoglobin: 8.9 g/dL — ABNORMAL LOW (ref 13.0–17.0)
Immature Granulocytes: 14 %
Lymphocytes Relative: 4 %
Lymphs Abs: 0.7 10*3/uL (ref 0.7–4.0)
MCH: 31.7 pg (ref 26.0–34.0)
MCHC: 32.2 g/dL (ref 30.0–36.0)
MCV: 98.2 fL (ref 80.0–100.0)
Monocytes Absolute: 2.1 10*3/uL — ABNORMAL HIGH (ref 0.1–1.0)
Monocytes Relative: 11 %
Neutro Abs: 14.2 10*3/uL — ABNORMAL HIGH (ref 1.7–7.7)
Neutrophils Relative %: 70 %
Platelet Count: 345 10*3/uL (ref 150–400)
RBC: 2.81 MIL/uL — ABNORMAL LOW (ref 4.22–5.81)
RDW: 17.5 % — ABNORMAL HIGH (ref 11.5–15.5)
Smear Review: NORMAL
WBC Count: 19.9 10*3/uL — ABNORMAL HIGH (ref 4.0–10.5)
nRBC: 0.2 % (ref 0.0–0.2)

## 2023-07-23 LAB — URINALYSIS, W/ REFLEX TO CULTURE (INFECTION SUSPECTED)
Bilirubin Urine: NEGATIVE
Glucose, UA: NEGATIVE mg/dL
Hgb urine dipstick: NEGATIVE
Ketones, ur: NEGATIVE mg/dL
Leukocytes,Ua: NEGATIVE
Nitrite: NEGATIVE
Protein, ur: NEGATIVE mg/dL
Specific Gravity, Urine: 1.004 — ABNORMAL LOW (ref 1.005–1.030)
pH: 6 (ref 5.0–8.0)

## 2023-07-23 LAB — CBC WITH DIFFERENTIAL/PLATELET
Abs Immature Granulocytes: 3.02 10*3/uL — ABNORMAL HIGH (ref 0.00–0.07)
Basophils Absolute: 0.2 10*3/uL — ABNORMAL HIGH (ref 0.0–0.1)
Basophils Relative: 1 %
Eosinophils Absolute: 0.2 10*3/uL (ref 0.0–0.5)
Eosinophils Relative: 1 %
HCT: 24.8 % — ABNORMAL LOW (ref 39.0–52.0)
Hemoglobin: 7.8 g/dL — ABNORMAL LOW (ref 13.0–17.0)
Immature Granulocytes: 12 %
Lymphocytes Relative: 4 %
Lymphs Abs: 1 10*3/uL (ref 0.7–4.0)
MCH: 31.8 pg (ref 26.0–34.0)
MCHC: 31.5 g/dL (ref 30.0–36.0)
MCV: 101.2 fL — ABNORMAL HIGH (ref 80.0–100.0)
Monocytes Absolute: 2.8 10*3/uL — ABNORMAL HIGH (ref 0.1–1.0)
Monocytes Relative: 11 %
Neutro Abs: 17.7 10*3/uL — ABNORMAL HIGH (ref 1.7–7.7)
Neutrophils Relative %: 71 %
Platelets: 380 10*3/uL (ref 150–400)
RBC: 2.45 MIL/uL — ABNORMAL LOW (ref 4.22–5.81)
RDW: 17.6 % — ABNORMAL HIGH (ref 11.5–15.5)
WBC: 24.9 10*3/uL — ABNORMAL HIGH (ref 4.0–10.5)
nRBC: 0.1 % (ref 0.0–0.2)

## 2023-07-23 LAB — COMPREHENSIVE METABOLIC PANEL WITH GFR
ALT: 12 U/L (ref 0–44)
AST: 16 U/L (ref 15–41)
Albumin: 3.1 g/dL — ABNORMAL LOW (ref 3.5–5.0)
Alkaline Phosphatase: 102 U/L (ref 38–126)
Anion gap: 8 (ref 5–15)
BUN: 12 mg/dL (ref 8–23)
CO2: 26 mmol/L (ref 22–32)
Calcium: 8.8 mg/dL — ABNORMAL LOW (ref 8.9–10.3)
Chloride: 100 mmol/L (ref 98–111)
Creatinine, Ser: 0.98 mg/dL (ref 0.61–1.24)
GFR, Estimated: >60 mL/min (ref >60)
Glucose, Bld: 95 mg/dL (ref 70–99)
Potassium: 4.2 mmol/L (ref 3.5–5.1)
Sodium: 134 mmol/L — ABNORMAL LOW (ref 135–145)
Total Bilirubin: 0.5 mg/dL (ref 0.0–1.2)
Total Protein: 6.4 g/dL — ABNORMAL LOW (ref 6.5–8.1)

## 2023-07-23 LAB — I-STAT CG4 LACTIC ACID, ED: Lactic Acid, Venous: 0.7 mmol/L (ref 0.5–1.9)

## 2023-07-23 LAB — BRAIN NATRIURETIC PEPTIDE: B Natriuretic Peptide: 190.5 pg/mL — ABNORMAL HIGH (ref 0.0–100.0)

## 2023-07-23 LAB — RESP PANEL BY RT-PCR (RSV, FLU A&B, COVID)  RVPGX2
Influenza A by PCR: NEGATIVE
Influenza B by PCR: NEGATIVE
Resp Syncytial Virus by PCR: NEGATIVE
SARS Coronavirus 2 by RT PCR: NEGATIVE

## 2023-07-23 LAB — TROPONIN I (HIGH SENSITIVITY): Troponin I (High Sensitivity): 8 ng/L (ref <18)

## 2023-07-23 LAB — SAMPLE TO BLOOD BANK

## 2023-07-23 MED ORDER — LACTATED RINGERS IV BOLUS: 500.0000 mL | Freq: Once | INTRAVENOUS | Status: DC

## 2023-07-23 MED ORDER — SODIUM CHLORIDE (PF) 0.9 % IJ SOLN
INTRAMUSCULAR | Status: AC
  Filled 2023-07-23: qty 50

## 2023-07-23 MED ORDER — IOHEXOL 350 MG/ML SOLN
75.0000 mL | Freq: Once | INTRAVENOUS | Status: AC | PRN
  Administered 2023-07-23: 75 mL via INTRAVENOUS

## 2023-07-23 NOTE — Telephone Encounter (Signed)
 TC from pt, asking if he needs to wait in the Cancer Center waiting room for blood results. Informed that his Hgb is 8.9, so he does not need a blood transfusion at this time per Dr. Alita Irwin. Pt proceeds to say that he is light-headed mostly upon standing, but he is well hydrated, and the only new medication he has recently taken is Vesicare. Pt does state that when he takes a deep breath, he often coughs and has "a little" drainage. Discussed pt's condition further w/ Dr. Alita Irwin, along with new finding of WBC 19.9, and he states he would like for pt to go to the ED to r/o PE. Pt made aware and states he will go to [facility] ED.

## 2023-07-23 NOTE — Discharge Instructions (Signed)
 As discussed, please follow-up with your primary doctor and your oncologist.  Return to the fevers, chills, lightheadedness, passout, develop chest pain, worsening shortness of breath, abdominal pain, nausea vomiting, urinary symptoms or any new or worsening symptoms that are concerning to you.

## 2023-07-23 NOTE — ED Provider Notes (Signed)
 Received signout; see morning team's note for full details.  Patient evaluated and is feeling improved.  Ambulated to the bathroom without symptoms.  Shortness of breath resolved.  Normotensive in the room with sinus rhythm on the monitor as interpreted by me.  Signed out pending a CTA CTA was negative for PE or pneumonia.  Reviewed his workup overall reassuring no safe metabolic derangements.  Normal kidney function.  No transaminitis suggest hepatobiliary disease.  Troponin negative.  BNP with minor elevation, but no lower extremity edema.  Chest x-ray clear, CTA clear.  Troponin not elevated.  Not having chest pain.  He does have a leukocytosis 24.9 and slightly downtrending hemoglobin.  No overt sources of blood loss.  He does get pegfilgrastim  injections on 5/8 and thinks that his elevated white blood cell count is secondary to that.  UA not consistent with overt urinary tract infection.  Shared decision making with patient regarding admission for his elevated white blood cell count further workup and antibiotics versus close outpatient follow-up and return if blood cultures were to come back positive.  Patient has elected to follow-up outpatient with cancer doctor and will return if symptoms return or worsen.  Will discharge at patient's request.   Rolinda Climes, DO 07/23/23 1817

## 2023-07-23 NOTE — Addendum Note (Signed)
 Addended by: Renard Carolin C on: 07/23/2023 12:27 PM   Modules accepted: Orders

## 2023-07-23 NOTE — ED Triage Notes (Signed)
 Patient has stage 4 prostate cancer. Getting chemo. Stated today he has been short of breath, fatigue, dizzy. Coughs when he takes a deep breath.

## 2023-07-23 NOTE — Telephone Encounter (Signed)
 TC to pt to inform of Dr. Nichael Nickel request for him to come in for CBC today. Lab and pt can schedule appt for 1:15 today. Orders for CBC w/ sample to blood bank ordered per Dr. Alita Irwin. If hgb is below 7.5, orders to transfuse 1 unit of PRBCs. Will evaluate results.

## 2023-07-23 NOTE — ED Provider Notes (Signed)
 [place] EMERGENCY DEPARTMENT AT [facility] Provider Note   CSN: [id number] Arrival date & time: 07/23/23  1403     History {Add pertinent medical, surgical, social history, OB history to HPI:1} Chief Complaint  Patient presents with   Shortness of Breath    Johnny Davenport is a 73 y.o. male.  HPI     4 rounds of chemotherapy, each round is harder At the low point in cycle, day 9  Began feeling lightheaded a few days ago, feeling really tired Each round has new complication, mouth sores, constipation Lightheadedness improves, worse going from sitting to standing When take a deep breath will cough, not sure if pain with deep breaths. Is feeling short of breath What comes up is clear mucus, last few days coughing more at night when laying down. Has always had allergies, sinus issues No fever No abdominal pain, does have some constipation and bloating No nausea or vomiting No black or bloody stools Did have some pain with urination, 2 sessions ago 4 weeks ago had azo and felt better  No other flank pain With nose blowing will get some blood    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   Wears glasses      Home Medications Prior to Admission medications   Medication Sig Start Date End Date Taking? Authorizing Provider  bisacodyl (DULCOLAX) 5 MG EC tablet Take 5 mg by mouth in the morning, at noon, and at bedtime.   Yes [provider]  Calcium  Carb-Cholecalciferol (CALCIUM  + D3 PO) Take 1 tablet by mouth 2 (two) times daily with a meal.   Yes [provider]  CLARITIN 10 MG tablet Take 10 mg by mouth See admin instructions. Starts day of in jectopm 8th gfpt -[[[for 7 days going forward   Yes [provider]  clobetasol  (TEMOVATE ) 0.05 % GEL Dry canker sore with a tissue or cloth and then apply a small amount of ointment three times a day 06/23/23  Yes Copland, Skipper Dumas, MD  darolutamide  (NUBEQA ) 300 MG tablet Take 2 tablets (600 mg total) by mouth 2 (two) times daily with a meal. 04/15/23  Yes Lowanda Ruddy, MD  dexamethasone  (DECADRON ) 4 MG tablet Take 2 tabs by mouth 2 times daily starting day before chemo. Then take 2 tabs daily for 2 days starting day after chemo. Take with food. Patient taking differently: Take 8 mg by mouth See admin instructions. Take 8 mg (2 tablets) by mouth 2 times daily starting day before chemo. Then, take 8 mg (2 tablets) daily for 2 days starting day after chemo. Take with food. 05/06/23  Yes Lowanda Ruddy, MD  fluticasone  (FLONASE ) 50 MCG/ACT nasal spray Place 1 spray into both nostrils daily as needed for allergies or rhinitis (4 and 5  dat).   Yes [provider]  omeprazole  (PRILOSEC) 20 MG capsule Take 1 capsule (20 mg total) by mouth daily. Patient taking differently: Take 20 mg by mouth daily before breakfast. 05/07/23  Yes Copland, Skipper Dumas, MD  ondansetron  (ZOFRAN ) 8 MG tablet Take 1 tablet (8 mg total) by mouth every 8 (eight) hours as needed for nausea or vomiting. 05/06/23  Yes Lowanda Ruddy, MD  OVER THE COUNTER MEDICATION Take 5 mg by mouth See admin instructions. [employer] Laxative, Comfort Coated Tablets - Take 5 mg by mouth once a day AS NEEDED for constipation   Yes [provider]  pramoxine (PROCTOFOAM) 1 % foam Place 1 Application rectally 3 (three) times daily as needed for anal itching. 06/20/23  Yes Lowanda Ruddy, MD  prochlorperazine  (COMPAZINE ) 10 MG tablet Take 1 tablet (10 mg total) by mouth every 6 (six) hours as needed for nausea or vomiting (take twice  daily on days 1 to 3 of chemotherapy, then follow as needed instruction if feeling fine by day 4). 05/06/23  Yes Lowanda Ruddy, MD  relugolix  (ORGOVYX ) 120 MG tablet Take 1 tablet (120 mg total) by mouth daily. Patient taking differently: Take 120 mg by mouth daily with supper. 04/15/23  Yes Lowanda Ruddy, MD  silodosin (RAPAFLO) 8 MG CAPS capsule Take 8 mg by mouth daily with breakfast.   Yes [provider]  solifenacin (VESICARE) 10 MG tablet Take 10 mg by mouth daily with supper.   Yes [provider]  SUMAtriptan  (IMITREX ) 50 MG tablet TAKE ONE TABLET BY MOUTH AT ONSET OF MIGRAINE. MAY REPEAT DOSE WITH ONE TABLET IN TWO HOURS IF NEEDED. DO NOT EXCEED TWO TABLETS IN 24 HOURS. 06/12/23  Yes Copland, Skipper Dumas, MD      Allergies    Other    Review of Systems   Review of Systems  Physical Exam Updated Vital Signs BP 126/89 (BP Location: Left Arm)   Pulse 90   Temp (!) 97.5 F (36.4 C) (Oral)   Resp (!) 23   Ht 6' (1.829 m)   Wt 80.7 kg   SpO2 98%   BMI 24.14 kg/m  Physical Exam  ED Results / Procedures / Treatments   Labs (all labs ordered are listed, but only abnormal results are displayed) Labs Reviewed  CBC WITH DIFFERENTIAL/PLATELET - Abnormal; Notable for the following components:      Result Value   WBC 24.9 (*)    RBC 2.45 (*)    Hemoglobin 7.8 (*)    HCT 24.8 (*)    MCV 101.2 (*)    RDW 17.6 (*)    All other components within normal limits  RESP PANEL BY RT-PCR (RSV, FLU A&B, COVID)  RVPGX2  COMPREHENSIVE METABOLIC PANEL WITH GFR  BRAIN NATRIURETIC PEPTIDE  TROPONIN I (HIGH SENSITIVITY)    EKG None  Radiology DG Chest Portable 1 View Result Date: 07/23/2023 CLINICAL DATA:  Dyspnea, prostate cancer EXAM: PORTABLE CHEST 1 VIEW COMPARISON:  10/25/2022, 04/08/2023 FINDINGS: Single frontal view of the chest demonstrates an unremarkable cardiac silhouette. No acute airspace disease, effusion, or pneumothorax. The small pulmonary nodule seen on  prior PET scan are not readily apparent by x-ray. There are no acute bony abnormalities. The bony metastases noted on recent PET scan are not readily apparent by radiograph. IMPRESSION: 1. No acute intrathoracic process. 2. The pulmonary, lymph node, and bone metastases seen on recent PET scan are not readily apparent by x-ray. Electronically Signed   By: Bobbye Burrow M.D.   On: 07/23/2023 15:03    Procedures Procedures  {Document cardiac monitor, telemetry assessment procedure when appropriate:1}  Medications Ordered in ED Medications - No data to  display  ED Course/ Medical Decision Making/ A&P   {   Click here for ABCD2, HEART and other calculatorsREFRESH Note before signing :1}                              Medical Decision Making Amount and/or Complexity of Data Reviewed Labs: ordered. Radiology: ordered.   ***  {Document critical care time when appropriate:1} {Document review of labs and clinical decision tools ie heart score, Chads2Vasc2 etc:1}  {Document your independent review of radiology images, and any outside records:1} {Document your discussion with family members, caretakers, and with consultants:1} {Document social determinants of health affecting pt's care:1} {Document your decision making why or why not admission, treatments were needed:1} Final Clinical Impression(s) / ED Diagnoses Final diagnoses:  None    Rx / DC Orders ED Discharge Orders     None

## 2023-07-23 NOTE — Telephone Encounter (Signed)
 TC from pt to report that he has been feeling dizzy when he stands up. He also reports more fatigue than usual and occasional sob, although no cp. He reports adequate fluid intake. Pt states he thinks his hemoglobin may be dropping--was 8.9 on 5/6. Informed pt that advice will be given after consulting w/ Dr. Alita Irwin.

## 2023-07-24 ENCOUNTER — Telehealth: Payer: Self-pay

## 2023-07-24 ENCOUNTER — Other Ambulatory Visit: Payer: Self-pay

## 2023-07-24 DIAGNOSIS — C61 Malignant neoplasm of prostate: Secondary | ICD-10-CM

## 2023-07-24 LAB — URINE CULTURE: Culture: NO GROWTH

## 2023-07-24 NOTE — Telephone Encounter (Signed)
 Johnny Davenport scheduled his lab appointment.

## 2023-07-24 NOTE — Progress Notes (Signed)
 Spoke to patient. Slight lightheaded on standing. Resolve quickly.  No chest pain or tightness. Discussed continue to consult enough fluid. Will repeat lab next Thursday.  Johnny Davenport please schedule a lab appt next Thursday  Will check CBC, sample to blood bank and PSA.

## 2023-07-24 NOTE — Progress Notes (Signed)
 Specialty Pharmacy Refill Coordination Note  Johnny Davenport is a 73 y.o. male contacted today regarding refills of specialty medication(s) Darolutamide  (Nubeqa ); Relugolix  (ORGOVYX )   Patient requested (Patient-Rptd) Delivery   Delivery date: (Patient-Rptd) 07/31/23   Verified address: (Patient-Rptd) [address] [place], [place] [address]   Medication will be filled on 05.21.25.

## 2023-07-28 LAB — CULTURE, BLOOD (ROUTINE X 2)
Culture: NO GROWTH
Culture: NO GROWTH
Special Requests: ADEQUATE
Special Requests: ADEQUATE

## 2023-07-29 ENCOUNTER — Other Ambulatory Visit: Payer: Self-pay

## 2023-07-29 DIAGNOSIS — Z8546 Personal history of malignant neoplasm of prostate: Secondary | ICD-10-CM

## 2023-07-30 ENCOUNTER — Other Ambulatory Visit (HOSPITAL_COMMUNITY): Payer: Self-pay

## 2023-07-30 ENCOUNTER — Other Ambulatory Visit: Payer: Self-pay

## 2023-07-31 ENCOUNTER — Inpatient Hospital Stay

## 2023-07-31 DIAGNOSIS — Z5111 Encounter for antineoplastic chemotherapy: Secondary | ICD-10-CM | POA: Diagnosis not present

## 2023-07-31 DIAGNOSIS — Z79899 Other long term (current) drug therapy: Secondary | ICD-10-CM | POA: Diagnosis not present

## 2023-07-31 DIAGNOSIS — C7951 Secondary malignant neoplasm of bone: Secondary | ICD-10-CM | POA: Diagnosis not present

## 2023-07-31 DIAGNOSIS — D6481 Anemia due to antineoplastic chemotherapy: Secondary | ICD-10-CM | POA: Diagnosis not present

## 2023-07-31 DIAGNOSIS — N179 Acute kidney failure, unspecified: Secondary | ICD-10-CM | POA: Diagnosis not present

## 2023-07-31 DIAGNOSIS — C61 Malignant neoplasm of prostate: Secondary | ICD-10-CM

## 2023-07-31 LAB — CBC WITH DIFFERENTIAL (CANCER CENTER ONLY)
Abs Immature Granulocytes: 0.1 10*3/uL — ABNORMAL HIGH (ref 0.00–0.07)
Basophils Absolute: 0.1 10*3/uL (ref 0.0–0.1)
Basophils Relative: 0 %
Eosinophils Absolute: 0 10*3/uL (ref 0.0–0.5)
Eosinophils Relative: 0 %
HCT: 26.4 % — ABNORMAL LOW (ref 39.0–52.0)
Hemoglobin: 8.7 g/dL — ABNORMAL LOW (ref 13.0–17.0)
Immature Granulocytes: 1 %
Lymphocytes Relative: 3 %
Lymphs Abs: 0.6 10*3/uL — ABNORMAL LOW (ref 0.7–4.0)
MCH: 31.6 pg (ref 26.0–34.0)
MCHC: 33 g/dL (ref 30.0–36.0)
MCV: 96 fL (ref 80.0–100.0)
Monocytes Absolute: 1.2 10*3/uL — ABNORMAL HIGH (ref 0.1–1.0)
Monocytes Relative: 6 %
Neutro Abs: 17.9 10*3/uL — ABNORMAL HIGH (ref 1.7–7.7)
Neutrophils Relative %: 90 %
Platelet Count: 360 10*3/uL (ref 150–400)
RBC: 2.75 MIL/uL — ABNORMAL LOW (ref 4.22–5.81)
RDW: 17.2 % — ABNORMAL HIGH (ref 11.5–15.5)
WBC Count: 19.8 10*3/uL — ABNORMAL HIGH (ref 4.0–10.5)
nRBC: 0 % (ref 0.0–0.2)

## 2023-08-01 ENCOUNTER — Telehealth: Payer: Self-pay

## 2023-08-01 LAB — PROSTATE-SPECIFIC AG, SERUM (LABCORP): Prostate Specific Ag, Serum: <0.1 ng/mL (ref 0.0–4.0)

## 2023-08-01 NOTE — Progress Notes (Unsigned)
 [facility] OFFICE PROGRESS NOTE  Patient Care Team: Copland, Skipper Dumas, MD as PCP - General (Family Medicine) Katheleen Palmer, RN as Oncology Nurse Navigator Manny, Harvey Linen., MD as Consulting Physician (Urology) Kenith Payer, MD as Consulting Physician (Radiation Oncology) Neda Balk, RN as Registered Nurse Debbie Fails, Laura Polio, NP as Nurse Practitioner (Hematology and Oncology)  Johnny Davenport is a 73 y.o.male with history of BPH, arthritis, hyperlipidemia, prostate cancer [facility] cancer.  Currently on active treatment with docetaxel  triplet therapy.   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  Germline testing: negative. Somatic testing: negative for MMR/MSI high. PD-L1 TPS 1%.  Treatment: s/p brachytherapy in 08/2022  Current treatment: Triple therapy with docetaxel , Relugolix  and darolutamide .   He gets monthly shipment of daro and relugolix  to his house.    Now completed C4. PSA <0.1. He is experiencing more side effects. Anemia from chemotherapy worse this week. ED visit after C4. Discussed will continue ADT/ARPI for now. Assessment & Plan   No orders of the defined types were placed in this encounter.    Lowanda Ruddy, MD  INTERVAL HISTORY: Patient returns for follow-up.  Oncology History  History of prostate cancer  05/06/2022 Cancer Staging   Staging form: Prostate, AJCC 8th Edition - Clinical stage from 05/06/2022: Stage IIB (cT2, cN0, cM0, PSA: 7.3, Grade Group: 2) - Signed by Keitha Pata, PA-C on 07/04/2022 Histopathologic type: Adenocarcinoma, NOS Stage prefix: Initial diagnosis Prostate specific antigen (PSA) range: Less than 10 Gleason primary pattern: 3 Gleason secondary pattern: 4 Gleason score: 7 Histologic grading system: 5 grade system Number of  biopsy cores examined: 12 Number of biopsy cores positive: 4 Location of positive needle core biopsies: One side   05/28/2022 Initial Diagnosis   Malignant neoplasm of prostate   03/27/2023 Imaging   MRI L Hip Heterogeneous osseous metastasis involving the pelvis involving the L4 and L5 vertebral bodies Probable extraosseous extension and small pathologic fracture of left ischial tuberosity with cortical disruption laterally.  Moderate sized partial-thickness tear of the adjacent left hamstring tendon origin Bone marrow edema surrounding the left SI joint with mild edema type signal along the superior margin of the left piriformis muscle.  Finding could be secondary to extraosseous extension of metastasis or represent posttraumatic change Small partial-thickness tear in the left gluteus muscle minimus tendon at the insertion on the greater trochanter with associated reactive bone marrow edema in the superior greater trochanter. Small low signal intensity focus along the insertional fibers of the left gluteus tendons which could represent calcium  hydroxyapatite deposit Mild osteoarthritis of both. Possible enlarged left internal iliac/perirectal lymph node measuring 1.8 cm   04/08/2023 PET scan   PSMA PET 1. Extensive radiotracer avid skeletal metastasis involving the axillary and appendicular skeleton. 2. Extensive radiotracer avid lymph node metastasis in the pelvis, retroperitoneum, and mediastinum. 3. Multiple radiotracer avid small pulmonary metastasis.   04/10/2023 Tumor Marker   PSA 107   05/14/2023 -  Chemotherapy   Patient is on Treatment Plan : PROSTATE Docetaxel  (73) q21d      Genetic Testing   Negative CancerNext-Expanded +RNAinsight panel. The CancerNext-Expanded gene panel offered by [facility] and includes sequencing, rearrangement, and RNA analysis for the following 76 genes: AIP, ALK, APC, ATM, AXIN2, BAP1, BARD1, BMPR1A, BRCA1, BRCA2, BRIP1, CDC73, CDH1, CDK4, CDKN1B,  CDKN2A,  CEBPA, CHEK2, CTNNA1, DDX41, DICER1, ETV6, FH, FLCN, GATA2, LZTR1, MAX, MBD4, MEN1, MET, MLH1, MSH2, MSH3, MSH6, MUTYH, NF1, NF2, NTHL1, PALB2, PHOX2B, PMS2, POT1, PRKAR1A, PTCH1, PTEN, RAD51C, RAD51D, RB1, RET, RUNX1, SDHA, SDHAF2, SDHB, SDHC, SDHD, SMAD4, SMARCA4, SMARCB1, SMARCE1, STK11, SUFU, TMEM127, TP53, TSC1, TSC2, VHL, and WT1 (sequencing and deletion/duplication); EGFR, HOXB13, KIT, MITF, PDGFRA, POLD1, and POLE (sequencing only); EPCAM and GREM1 (deletion/duplication only). Report date 06/28/23.    Prostate cancer metastatic to multiple sites [facility])  04/08/2023 PET scan   PSMA PET 1. Extensive radiotracer avid skeletal metastasis involving the axillary and appendicular skeleton. 2. Extensive radiotracer avid lymph node metastasis in the pelvis, retroperitoneum, and mediastinum. 3. Multiple radiotracer avid small pulmonary metastasis.   04/09/2023 Initial Diagnosis   Prostate cancer metastatic to multiple sites [facility])   04/10/2023 Tumor Marker   PSA 107   04/10/2023 Cancer Staging   Staging form: Prostate, AJCC 8th Edition - Clinical stage from 04/10/2023: Stage IVB (rcT0, cN1, cM1c, PSA: 107, Grade Group: 2) - Signed by Kenith Payer, MD on 04/11/2023 Stage prefix: Recurrence Prostate specific antigen (PSA) range: 20 or greater Gleason score: 7 Histologic grading system: 5 grade system      PHYSICAL EXAMINATION: ECOG PERFORMANCE STATUS: {CHL ONC ECOG PS:[id number]}  There were no vitals filed for this visit. There were no vitals filed for this visit.  GENERAL: alert, no distress and comfortable SKIN: skin color normal and no bruising or petechiae or jaundice on exposed skin EYES: normal, sclera clear OROPHARYNX: no exudate  NECK: No palpable mass LYMPH:  no palpable cervical, axillary lymphadenopathy  LUNGS: clear to auscultation and percussion with normal breathing effort HEART: regular rate & rhythm  ABDOMEN: abdomen soft, non-tender and  nondistended. Musculoskeletal: no edema NEURO: no focal motor/sensory deficits  Relevant data reviewed during this visit included ***

## 2023-08-01 NOTE — Telephone Encounter (Signed)
 TC from pt, asking if he still needs labs on 5/27 since he just had labs yesterday. The only outstanding lab is LDH level. Discussed w/ Dr. Alita Irwin and he states that pt can cancel lab appt on 5/27. Pt aware.

## 2023-08-03 ENCOUNTER — Emergency Department (HOSPITAL_BASED_OUTPATIENT_CLINIC_OR_DEPARTMENT_OTHER)

## 2023-08-03 ENCOUNTER — Observation Stay (HOSPITAL_BASED_OUTPATIENT_CLINIC_OR_DEPARTMENT_OTHER)
Admission: EM | Admit: 2023-08-03 | Discharge: 2023-08-04 | Disposition: A | Discharge status: Home / Self Care | Attending: Family Medicine | Admitting: Family Medicine

## 2023-08-03 ENCOUNTER — Other Ambulatory Visit: Payer: Self-pay

## 2023-08-03 ENCOUNTER — Encounter (HOSPITAL_BASED_OUTPATIENT_CLINIC_OR_DEPARTMENT_OTHER): Payer: Self-pay

## 2023-08-03 DIAGNOSIS — E871 Hypo-osmolality and hyponatremia: Secondary | ICD-10-CM | POA: Diagnosis not present

## 2023-08-03 DIAGNOSIS — R338 Other retention of urine: Secondary | ICD-10-CM | POA: Diagnosis not present

## 2023-08-03 DIAGNOSIS — D72829 Elevated white blood cell count, unspecified: Secondary | ICD-10-CM | POA: Insufficient documentation

## 2023-08-03 DIAGNOSIS — R932 Abnormal findings on diagnostic imaging of liver and biliary tract: Secondary | ICD-10-CM | POA: Diagnosis not present

## 2023-08-03 DIAGNOSIS — C61 Malignant neoplasm of prostate: Secondary | ICD-10-CM | POA: Diagnosis not present

## 2023-08-03 DIAGNOSIS — N179 Acute kidney failure, unspecified: Secondary | ICD-10-CM | POA: Diagnosis not present

## 2023-08-03 DIAGNOSIS — R339 Retention of urine, unspecified: Secondary | ICD-10-CM | POA: Diagnosis not present

## 2023-08-03 DIAGNOSIS — D649 Anemia, unspecified: Secondary | ICD-10-CM | POA: Diagnosis not present

## 2023-08-03 DIAGNOSIS — I700 Atherosclerosis of aorta: Secondary | ICD-10-CM | POA: Diagnosis not present

## 2023-08-03 DIAGNOSIS — K5900 Constipation, unspecified: Secondary | ICD-10-CM | POA: Diagnosis not present

## 2023-08-03 DIAGNOSIS — C7951 Secondary malignant neoplasm of bone: Secondary | ICD-10-CM | POA: Diagnosis not present

## 2023-08-03 DIAGNOSIS — R109 Unspecified abdominal pain: Secondary | ICD-10-CM | POA: Diagnosis present

## 2023-08-03 LAB — URINALYSIS, ROUTINE W REFLEX MICROSCOPIC
Bilirubin Urine: NEGATIVE
Glucose, UA: NEGATIVE mg/dL
Ketones, ur: NEGATIVE mg/dL
Leukocytes,Ua: NEGATIVE
Nitrite: NEGATIVE
Protein, ur: NEGATIVE mg/dL
Specific Gravity, Urine: <=1.005 (ref 1.005–1.030)
pH: 5.5 (ref 5.0–8.0)

## 2023-08-03 LAB — COMPREHENSIVE METABOLIC PANEL WITH GFR
ALT: 11 U/L (ref 0–44)
AST: 21 U/L (ref 15–41)
Albumin: 3.4 g/dL — ABNORMAL LOW (ref 3.5–5.0)
Alkaline Phosphatase: 111 U/L (ref 38–126)
Anion gap: 12 (ref 5–15)
BUN: 21 mg/dL (ref 8–23)
CO2: 23 mmol/L (ref 22–32)
Calcium: 9 mg/dL (ref 8.9–10.3)
Chloride: 96 mmol/L — ABNORMAL LOW (ref 98–111)
Creatinine, Ser: 1.87 mg/dL — ABNORMAL HIGH (ref 0.61–1.24)
GFR, Estimated: 37 mL/min — ABNORMAL LOW (ref >60)
Glucose, Bld: 127 mg/dL — ABNORMAL HIGH (ref 70–99)
Potassium: 4.8 mmol/L (ref 3.5–5.1)
Sodium: 131 mmol/L — ABNORMAL LOW (ref 135–145)
Total Bilirubin: 0.6 mg/dL (ref 0.0–1.2)
Total Protein: 6.3 g/dL — ABNORMAL LOW (ref 6.5–8.1)

## 2023-08-03 LAB — CBC WITH DIFFERENTIAL/PLATELET
Abs Immature Granulocytes: 0.08 10*3/uL — ABNORMAL HIGH (ref 0.00–0.07)
Basophils Absolute: 0 10*3/uL (ref 0.0–0.1)
Basophils Relative: 0 %
Eosinophils Absolute: 0 10*3/uL (ref 0.0–0.5)
Eosinophils Relative: 0 %
HCT: 25 % — ABNORMAL LOW (ref 39.0–52.0)
Hemoglobin: 8 g/dL — ABNORMAL LOW (ref 13.0–17.0)
Immature Granulocytes: 1 %
Lymphocytes Relative: 4 %
Lymphs Abs: 0.5 10*3/uL — ABNORMAL LOW (ref 0.7–4.0)
MCH: 31.1 pg (ref 26.0–34.0)
MCHC: 32 g/dL (ref 30.0–36.0)
MCV: 97.3 fL (ref 80.0–100.0)
Monocytes Absolute: 1.1 10*3/uL — ABNORMAL HIGH (ref 0.1–1.0)
Monocytes Relative: 8 %
Neutro Abs: 12.4 10*3/uL — ABNORMAL HIGH (ref 1.7–7.7)
Neutrophils Relative %: 87 %
Platelets: 436 10*3/uL — ABNORMAL HIGH (ref 150–400)
RBC: 2.57 MIL/uL — ABNORMAL LOW (ref 4.22–5.81)
RDW: 17.2 % — ABNORMAL HIGH (ref 11.5–15.5)
WBC: 14.2 10*3/uL — ABNORMAL HIGH (ref 4.0–10.5)
nRBC: 0 % (ref 0.0–0.2)

## 2023-08-03 LAB — URINALYSIS, MICROSCOPIC (REFLEX)

## 2023-08-03 LAB — LIPASE, BLOOD: Lipase: 27 U/L (ref 11–51)

## 2023-08-03 MED ORDER — ACETAMINOPHEN 650 MG RE SUPP: 650.0000 mg | Freq: Four times a day (QID) | RECTAL | Status: DC | PRN

## 2023-08-03 MED ORDER — SODIUM CHLORIDE 0.9% FLUSH: 3.0000 mL | Freq: Two times a day (BID) | INTRAVENOUS | Status: DC

## 2023-08-03 MED ORDER — ACETAMINOPHEN 325 MG PO TABS
650.0000 mg | ORAL_TABLET | Freq: Four times a day (QID) | ORAL | Status: DC | PRN
  Administered 2023-08-04: 650 mg via ORAL
  Filled 2023-08-03: qty 2

## 2023-08-03 MED ORDER — TAMSULOSIN HCL 0.4 MG PO CAPS: 0.4000 mg | ORAL_CAPSULE | Freq: Every day | ORAL | Status: DC

## 2023-08-03 MED ORDER — SENNA 8.6 MG PO TABS
1.0000 | ORAL_TABLET | Freq: Every day | ORAL | Status: DC
  Administered 2023-08-03: 8.6 mg via ORAL
  Filled 2023-08-03: qty 1

## 2023-08-03 MED ORDER — IOHEXOL 300 MG/ML  SOLN
70.0000 mL | Freq: Once | INTRAMUSCULAR | Status: AC | PRN
  Administered 2023-08-03: 70 mL via INTRAVENOUS

## 2023-08-03 MED ORDER — FLUTICASONE PROPIONATE 50 MCG/ACT NA SUSP: 2.0000 | Freq: Every day | NASAL | Status: DC | PRN

## 2023-08-03 MED ORDER — PANTOPRAZOLE SODIUM 40 MG PO TBEC
40.0000 mg | DELAYED_RELEASE_TABLET | Freq: Every day | ORAL | Status: DC
  Administered 2023-08-04: 40 mg via ORAL
  Filled 2023-08-03: qty 1

## 2023-08-03 MED ORDER — POLYETHYLENE GLYCOL 3350 17 G PO PACK
17.0000 g | PACK | Freq: Two times a day (BID) | ORAL | Status: DC
  Administered 2023-08-03 – 2023-08-04 (×2): 17 g via ORAL
  Filled 2023-08-03 (×2): qty 1

## 2023-08-03 MED ORDER — RELUGOLIX 120 MG PO TABS
120.0000 mg | ORAL_TABLET | Freq: Every day | ORAL | Status: DC
Start: 2023-08-03 — End: 2023-08-04

## 2023-08-03 MED ORDER — LORATADINE 10 MG PO TABS: 10.0000 mg | ORAL_TABLET | ORAL | Status: DC

## 2023-08-03 MED ORDER — FESOTERODINE FUMARATE ER 4 MG PO TB24
4.0000 mg | ORAL_TABLET | Freq: Every day | ORAL | Status: DC
  Administered 2023-08-03: 4 mg via ORAL
  Filled 2023-08-03: qty 1

## 2023-08-03 MED ORDER — ONDANSETRON HCL 4 MG PO TABS: 8.0000 mg | ORAL_TABLET | Freq: Three times a day (TID) | ORAL | Status: DC | PRN

## 2023-08-03 MED ORDER — SODIUM CHLORIDE 0.9 % IV BOLUS
1000.0000 mL | Freq: Once | INTRAVENOUS | Status: AC
  Administered 2023-08-03: 1000 mL via INTRAVENOUS

## 2023-08-03 MED ORDER — DAROLUTAMIDE 300 MG PO TABS: 600.0000 mg | ORAL_TABLET | Freq: Two times a day (BID) | ORAL | Status: DC

## 2023-08-03 MED ORDER — LACTATED RINGERS IV SOLN: INTRAVENOUS | Status: DC

## 2023-08-03 MED ORDER — SODIUM CHLORIDE 0.9 % IV SOLN: INTRAVENOUS | Status: DC

## 2023-08-03 NOTE — Progress Notes (Signed)
 Plan of Care Note for accepted transfer  Patient: Johnny Davenport    ZOX:[id number]  DOA: 08/03/2023     Facility requesting transfer:: [facility] Requesting Provider: Dr Zackowski Reason for transfer: AKI Facility course: 73 y/o male with metastatic prostate cancer on chemo, presented to ED for urinary retention.Required foley with out[ut around 900 cc, found to have AKI with creatinine 1.87  Plan of care: The patient is accepted for admission to Med-surg  unit, at [facility]. Or [facility]   Author: Beatris Bough, MD  08/03/2023  Check [email or web address] for on-call coverage.  Nursing staff, Please call [facility] Admits & Consults System-Wide number on Amion as soon as patient's arrival, so appropriate admitting provider can evaluate the pt.

## 2023-08-03 NOTE — H&P (Signed)
 History and Physical    Patient: Johnny Davenport ZOX:[id number] DOB: 01/23/1951 DOA: 08/03/2023 DOS: the patient was seen and examined on 08/03/2023 PCP: Copland, Skipper Dumas, MD  Patient coming from: Home  Chief Complaint:  Chief Complaint  Patient presents with   Bloated   HPI:  73 year old man PMH metastatic prostate cancer, otherwise remarkably healthy, currently undergoing chemotherapy, presenting to the emergency department today with complaint of constipation 4 days.  Workup revealed urinary retention, Foley catheter was placed with more than 1 L urine out.  Labs revealed acute kidney injury.  Admitted for IV fluids and monitoring of AKI.  Review of Systems: As above, no fever  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   Wears glasses    Past Surgical History:  Procedure Laterality Date   COLONOSCOPY  2021   TA/hems-5 yr recall   Hand/Finger surgery  1997   KNEE ARTHROSCOPY W/ MENISCAL REPAIR Right    x 2   POLYPECTOMY  2016   TA   PROSTATE BIOPSY     RADIOACTIVE SEED IMPLANT N/A 08/13/2022   Procedure: RADIOACTIVE SEED IMPLANT/BRACHYTHERAPY IMPLANT;  Surgeon: Sherlyn Ditto, MD;  Location: [facility];  Service: Urology;  Laterality: N/A;  90 MINS   ROTATOR CUFF REPAIR Left    SPACE OAR INSTILLATION N/A 08/13/2022   Procedure: SPACE OAR INSTILLATION;  Surgeon: Sherlyn Ditto, MD;  Location: [facility];  Service: Urology;  Laterality: N/A;   TONSILLECTOMY     WISDOM TOOTH EXTRACTION     Social History:  reports that he has never smoked. He has  never used smokeless tobacco. He reports that he does not currently use alcohol after a past usage of about 1.0 standard drink of alcohol per week. He reports that he does not use drugs.  Allergies  Allergen Reactions   Other Other (See Comments) and Cough    Pollen From Flowers = Coughing, itchy eyes, sneezing, possible wheezing    Family History  Problem Relation Age of Onset   Stroke Mother 71   Healthy Sister    Healthy Sister    Leukemia Cousin 15   Breast cancer Cousin 72   Lymphoma Cousin    Bladder Cancer Cousin 44   Breast cancer Other    Colon cancer Neg Hx    Colon polyps Neg Hx    Esophageal cancer Neg Hx    Stomach cancer Neg Hx    Rectal cancer Neg Hx     Prior to Admission medications   Medication Sig Start Date End Date Taking? Authorizing Provider  Calcium  Carb-Cholecalciferol (CALCIUM  + D3 PO) Take 1 tablet by mouth 2 (two) times daily with a meal.    [provider]  CLARITIN 10 MG tablet Take 10 mg by mouth See admin instructions. Starting on the day of the injection, take 10 mg by mouth once a day for 7 days.    [provider]  clobetasol  (TEMOVATE ) 0.05 % GEL Dry canker sore with a  tissue or cloth and then apply a small amount of ointment three times a day 06/23/23   Copland, Skipper Dumas, MD  darolutamide  (NUBEQA ) 300 MG tablet Take 2 tablets (600 mg total) by mouth 2 (two) times daily with a meal. 04/15/23   Lowanda Ruddy, MD  dexamethasone  (DECADRON ) 4 MG tablet Take 2 tabs by mouth 2 times daily starting day before chemo. Then take 2 tabs daily for 2 days starting day after chemo. Take with food. Patient taking differently: Take 8 mg by mouth See admin instructions. Take 8 mg (2 tablets) by mouth 2 times daily starting day before chemo. Then, take 8 mg (2 tablets) daily for 2 days starting day after chemo. Take with food. 05/06/23   Lowanda Ruddy, MD  DULCOLAX 5 MG EC tablet Take 5 mg by mouth in the morning, at noon, and at bedtime.     [provider]  fluticasone  (FLONASE ) 50 MCG/ACT nasal spray Place 1 spray into both nostrils See admin instructions. Instill 1 spray into each nostril once a day as needed for allergies or rhinitis- for four to five consecutive days at a time, when using    [provider]  omeprazole  (PRILOSEC) 20 MG capsule Take 1 capsule (20 mg total) by mouth daily. Patient taking differently: Take 20 mg by mouth daily before breakfast. 05/07/23   Copland, Jessica C, MD  ondansetron  (ZOFRAN ) 8 MG tablet Take 1 tablet (8 mg total) by mouth every 8 (eight) hours as needed for nausea or vomiting. 05/06/23   Lowanda Ruddy, MD  OVER THE COUNTER MEDICATION Take 5 mg by mouth See admin instructions. [employer] Laxative/Comfort Coated Tablets - Take 5 mg by mouth once a day AS NEEDED for constipation    [provider]  pramoxine (PROCTOFOAM) 1 % foam Place 1 Application rectally 3 (three) times daily as needed for anal itching. 06/20/23   Lowanda Ruddy, MD  prochlorperazine  (COMPAZINE ) 10 MG tablet Take 1 tablet (10 mg total) by mouth every 6 (six) hours as needed for nausea or vomiting (take twice daily on days 1 to 3 of chemotherapy, then follow as needed instruction if feeling fine by day 4). 05/06/23   Lowanda Ruddy, MD  relugolix  (ORGOVYX ) 120 MG tablet Take 1 tablet (120 mg total) by mouth daily. Patient taking differently: Take 120 mg by mouth daily with supper. 04/15/23   Lowanda Ruddy, MD  silodosin (RAPAFLO) 8 MG CAPS capsule Take 8 mg by mouth daily with breakfast.    [provider]  solifenacin (VESICARE) 10 MG tablet Take 10 mg by mouth daily with supper.    [provider]  SUMAtriptan  (IMITREX ) 50 MG tablet TAKE ONE TABLET BY MOUTH AT ONSET OF MIGRAINE. MAY REPEAT DOSE WITH ONE TABLET IN TWO HOURS IF NEEDED. DO NOT EXCEED TWO TABLETS IN 24 HOURS. Patient taking differently: Take 50 mg by mouth See admin instructions. Take 50 mg by mouth at the onset of a migraine  and may repeat ONCE in two hours, if no relief. Cannot exceed 2 tablets/24 hours. 06/12/23   Copland, Jessica C, MD  TYLENOL 500 MG tablet Take 1,000 mg by mouth every 6 (six) hours as needed (for headaches or more mild pain).    [provider]    Physical Exam: Vitals:   08/03/23 1230 08/03/23 1245 08/03/23 1300 08/03/23 1442  BP: 116/63 111/60 (!) 102/59 (!) 138/108  Pulse: 84 97 75 69  Resp:  16  Temp:    97.8 F (36.6 C)  TempSrc:    Oral  SpO2: 97% 91% 97% 91%  Weight:      Height:       Physical Exam Vitals reviewed.  Constitutional:      General: He is not in acute distress.    Appearance: He is not ill-appearing or toxic-appearing.  HENT:     Mouth/Throat:     Mouth: Mucous membranes are moist.     Pharynx: Oropharynx is clear.  Eyes:     Comments: Eyes and conjunctive appear grossly normal  Cardiovascular:     Rate and Rhythm: Normal rate and regular rhythm.     Heart sounds: No murmur heard. Pulmonary:     Effort: Pulmonary effort is normal. No respiratory distress.     Breath sounds: No wheezing, rhonchi or rales.  Abdominal:     General: Abdomen is flat. There is no distension.     Palpations: Abdomen is soft.     Tenderness: There is no abdominal tenderness. There is no guarding.  Musculoskeletal:     Right lower leg: No edema.     Left lower leg: No edema.  Neurological:     Mental Status: He is alert.  Psychiatric:        Mood and Affect: Mood normal.        Behavior: Behavior normal.     Data Reviewed: Na+ 131 Creatinine 1.87 CMP noted WBC 14.2 Hg 8.0 U/a unremarkable CT imaging noted  Assessment and Plan: Acute urinary retention Metastatic prostate cancer currently undergoing chemotherapy Foley catheter placed in the emergency department.  Discussed with patient, will continue, plan discharge with leg bag and outpatient follow-up with urology in 1 week for voiding trial.  Acute kidney injury secondary to above Baseline  creatinine around 0.9. Admit creatinine 1.87 IV fluids, check BMP in AM.  Expect renal function to improve, if so, anticipate discharge home with outpatient follow-up with PCP  Mild hyponatremia Monitor  Possible bladder abnormality Possible mild asymmetric posterior bladder wall thickening right greater than left versus asymmetric early pooling of dependent contrast material. Recommend correlation with urinalysis and consider urology protocol CT versus cystoscopy for further evaluation. Has follow-up with Dr. Willye Harvey in the near future  Aortic atherosclerosis  Advance Care Planning: confirmed full code  Consults: none   Family Communication: none  Severity of Illness: The appropriate patient status for this patient is OBSERVATION. Observation status is judged to be reasonable and necessary in order to provide the required intensity of service to ensure the patient's safety. The patient's presenting symptoms, physical exam findings, and initial radiographic and laboratory data in the context of their medical condition is felt to place them at decreased risk for further clinical deterioration. Furthermore, it is anticipated that the patient will be medically stable for discharge from the hospital within 2 midnights of admission.   Author: Jerline Moon, MD 08/03/2023 3:07 PM  For on call review [email or web address].

## 2023-08-03 NOTE — ED Notes (Signed)
 Pt returned form ct. Resting comfortably

## 2023-08-03 NOTE — ED Triage Notes (Signed)
 Pr reports that he thinks that he is constipated. States that he hasn't had a full bowel movement since Thursday. Is a cancer pt and it was recommended to be evaluated. Pt states that he feels bloated.

## 2023-08-03 NOTE — ED Notes (Signed)
 Pt given ice chips cleared by EDP for same.

## 2023-08-03 NOTE — ED Notes (Signed)
 ED Provider at bedside.

## 2023-08-03 NOTE — ED Provider Notes (Addendum)
 [facility] Provider Note   CSN: [id number] Arrival date & time: 08/03/23  8657     History  Chief Complaint  Patient presents with   Bloated    Johnny Davenport is a 73 y.o. male.  Patient feeling bloated some mild abdominal discomfort.  No good bowel movement since Thursday.  Patient is currently being treated for prostate cancer at the [facility].  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  Current treatment is triple therapy treatment with docetaxel  relugolix  and darolutamide .  Patient states she has just had some small bowel movements.  Patient has been using a stool softener.  And did attempt to use laxatives.  Without any help.  Patient did have some trouble with constipation with previous cycles of this chemotherapy regiment.  Patient just received cycle 4.  Patient followed by Dr. Alita Irwin at cancer center.  Last seen by them on May 6.  That is when he had cycle 4.  Overall diagnosis prostate cancer metastatic to multiple sites.  Past medical history significant for hyperlipidemia migraines and the metastatic prostate cancer.  Patient not on any blood thinners.       Home Medications Prior to Admission medications   Medication Sig Start Date End Date Taking? Authorizing Provider  Calcium  Carb-Cholecalciferol (CALCIUM  + D3 PO) Take 1 tablet by mouth 2 (two) times daily with a meal.    [provider]  CLARITIN 10 MG tablet Take 10 mg by mouth See admin instructions. Starting on the day of the injection, take 10 mg by mouth once a day for 7 days.    [provider]  clobetasol  (TEMOVATE ) 0.05 % GEL Dry canker sore with a tissue or cloth and then apply a small amount of ointment three times a day 06/23/23   Copland, Skipper Dumas, MD  darolutamide  (NUBEQA ) 300 MG tablet Take 2 tablets (600 mg total) by mouth 2 (two) times daily with a meal. 04/15/23   Lowanda Ruddy, MD  dexamethasone   (DECADRON ) 4 MG tablet Take 2 tabs by mouth 2 times daily starting day before chemo. Then take 2 tabs daily for 2 days starting day after chemo. Take with food. Patient taking differently: Take 8 mg by mouth See admin instructions. Take 8 mg (2 tablets) by mouth 2 times daily starting day before chemo. Then, take 8 mg (2 tablets) daily for 2 days starting day after chemo. Take with food. 05/06/23   Lowanda Ruddy, MD  DULCOLAX 5 MG EC tablet Take 5 mg by mouth in the morning, at noon, and at bedtime.    [provider]  fluticasone  (FLONASE ) 50 MCG/ACT nasal spray Place 1 spray into both nostrils See admin instructions. Instill 1 spray into each nostril once a day as needed for allergies or rhinitis- for four to five consecutive days at a time, when using    [provider]  omeprazole  (PRILOSEC) 20 MG capsule Take 1 capsule (20 mg total) by mouth daily. Patient taking differently: Take 20 mg by mouth daily before breakfast. 05/07/23   Copland, Jessica C, MD  ondansetron  (ZOFRAN ) 8 MG tablet Take 1 tablet (8 mg total) by mouth every 8 (eight) hours as needed for nausea or vomiting. 05/06/23   Lowanda Ruddy, MD  OVER THE COUNTER MEDICATION Take 5 mg by mouth See admin instructions. [employer] Laxative/Comfort Coated Tablets - Take 5 mg by mouth once a day AS NEEDED  for constipation    [provider]  pramoxine (PROCTOFOAM) 1 % foam Place 1 Application rectally 3 (three) times daily as needed for anal itching. 06/20/23   Lowanda Ruddy, MD  prochlorperazine  (COMPAZINE ) 10 MG tablet Take 1 tablet (10 mg total) by mouth every 6 (six) hours as needed for nausea or vomiting (take twice daily on days 1 to 3 of chemotherapy, then follow as needed instruction if feeling fine by day 4). 05/06/23   Lowanda Ruddy, MD  relugolix  (ORGOVYX ) 120 MG tablet Take 1 tablet (120 mg total) by mouth daily. Patient taking differently: Take 120 mg by mouth daily with supper. 04/15/23   Lowanda Ruddy, MD   silodosin (RAPAFLO) 8 MG CAPS capsule Take 8 mg by mouth daily with breakfast.    [provider]  solifenacin (VESICARE) 10 MG tablet Take 10 mg by mouth daily with supper.    [provider]  SUMAtriptan  (IMITREX ) 50 MG tablet TAKE ONE TABLET BY MOUTH AT ONSET OF MIGRAINE. MAY REPEAT DOSE WITH ONE TABLET IN TWO HOURS IF NEEDED. DO NOT EXCEED TWO TABLETS IN 24 HOURS. Patient taking differently: Take 50 mg by mouth See admin instructions. Take 50 mg by mouth at the onset of a migraine and may repeat ONCE in two hours, if no relief. Cannot exceed 2 tablets/24 hours. 06/12/23   Copland, Jessica C, MD  TYLENOL 500 MG tablet Take 1,000 mg by mouth every 6 (six) hours as needed (for headaches or more mild pain).    [provider]      Allergies    Other    Review of Systems   Review of Systems  Constitutional:  Negative for chills and fever.  HENT:  Negative for ear pain and sore throat.   Eyes:  Negative for pain and visual disturbance.  Respiratory:  Negative for cough and shortness of breath.   Cardiovascular:  Negative for chest pain and palpitations.  Gastrointestinal:  Positive for abdominal distention, abdominal pain and constipation. Negative for vomiting.  Genitourinary:  Negative for dysuria and hematuria.  Musculoskeletal:  Negative for arthralgias and back pain.  Skin:  Negative for color change and rash.  Neurological:  Negative for seizures and syncope.  All other systems reviewed and are negative.   Physical Exam Updated Vital Signs BP 122/76 (BP Location: Right Arm)   Pulse (!) 101   Temp 98.1 F (36.7 C) (Oral)   Resp 18   Ht 1.854 m (6\' 1" )   Wt 81.6 kg   SpO2 98%   BMI 23.75 kg/m  Physical Exam Vitals and nursing note reviewed.  Constitutional:      General: He is not in acute distress.    Appearance: Normal appearance. He is well-developed.  HENT:     Head: Normocephalic and atraumatic.  Eyes:     Extraocular Movements:  Extraocular movements intact.     Conjunctiva/sclera: Conjunctivae normal.     Pupils: Pupils are equal, round, and reactive to light.  Cardiovascular:     Rate and Rhythm: Normal rate and regular rhythm.     Heart sounds: No murmur heard. Pulmonary:     Effort: Pulmonary effort is normal. No respiratory distress.     Breath sounds: Normal breath sounds.  Abdominal:     General: There is distension.     Palpations: Abdomen is soft.     Tenderness: There is no abdominal tenderness.  Musculoskeletal:        General: No  swelling.     Cervical back: Neck supple.  Skin:    General: Skin is warm and dry.     Capillary Refill: Capillary refill takes less than 2 seconds.  Neurological:     General: No focal deficit present.     Mental Status: He is alert and oriented to person, place, and time.  Psychiatric:        Mood and Affect: Mood normal.     ED Results / Procedures / Treatments   Labs (all labs ordered are listed, but only abnormal results are displayed) Labs Reviewed  COMPREHENSIVE METABOLIC PANEL WITH GFR - Abnormal; Notable for the following components:      Result Value   Sodium 131 (*)    Chloride 96 (*)    Glucose, Bld 127 (*)    Creatinine, Ser 1.87 (*)    Total Protein 6.3 (*)    Albumin 3.4 (*)    GFR, Estimated 37 (*)    All other components within normal limits  CBC WITH DIFFERENTIAL/PLATELET - Abnormal; Notable for the following components:   WBC 14.2 (*)    RBC 2.57 (*)    Hemoglobin 8.0 (*)    HCT 25.0 (*)    RDW 17.2 (*)    Platelets 436 (*)    Neutro Abs 12.4 (*)    Lymphs Abs 0.5 (*)    Monocytes Absolute 1.1 (*)    Abs Immature Granulocytes 0.08 (*)    All other components within normal limits  LIPASE, BLOOD    EKG None  Radiology No results found.  Procedures Procedures    Medications Ordered in ED Medications  sodium chloride  0.9 % bolus 1,000 mL (has no administration in time range)  0.9 %  sodium chloride  infusion (has no  administration in time range)  iohexol  (OMNIPAQUE ) 300 MG/ML solution 70 mL (70 mLs Intravenous Contrast Given 08/03/23 1004)    ED Course/ Medical Decision Making/ A&P                                 Medical Decision Making Amount and/or Complexity of Data Reviewed Labs: ordered. Radiology: ordered.  Risk Prescription drug management. Decision regarding hospitalization.   Will get lab screening and get CT abdomen and pelvis.  To rule out any obstruction.  CBC white count 14.2 hemoglobin 8 platelets 436.  Patient's hemoglobin was 8.7 on May 22 of this year.  Lipase and complete metabolic panel pending.  Patient's complete metabolic panel sodium is 131 on glucose 127 creatinine 1.87 for GFR of 37.  Liver function test are normal.  Lipase normal at 27.  Anion gap normal.  The renal function changes significant.  Patient had GFR greater than 60 just this month.  Despite no nausea vomiting or diarrhea that is a significant change.  Will give IV fluids here.  Patient's renal function is mention today GFR is 37.  May require admission for this.  Sodium a little low as well.  At 131 but not critical.  CT results still pending.  CT scan raises concerns for post renal obstruction.  There is mild bilateral hydronephrosis with dilatation of the ureters mild associated bladder distention could be a degree of bladder outlet obstruction.  Bladder scan showed greater than 500 cc of urine.  Will place Foley catheter.  Will give IV hydration.  Will check urinalysis.  Patient will need admission for the acute kidney injury which is  probably post renal.  Foley catheter placed after bladder scans showing more than 500 cc of urine.  We got approximately 900 cc of urine out.  This is probably the cause of the acute kidney injury.  But feel he still needs admission make sure kidney function goes back to normal.  Patient is followed by Dr. Willye Harvey from urology.  Will contact hospitalist for  admission.     Final Clinical Impression(s) / ED Diagnoses Final diagnoses:  AKI (acute kidney injury) [facility])    Rx / DC Orders ED Discharge Orders     None         Zeeva Courser, MD 08/03/23 8657    Nicklas Barns, MD 08/03/23 8469    Nicklas Barns, MD 08/03/23 1024    Nicklas Barns, MD 08/03/23 1121    Roshawna Colclasure, MD 08/03/23 1151    Joelle Roswell, MD 08/03/23 1226

## 2023-08-03 NOTE — Hospital Course (Addendum)
 73 y/o male with metastatic prostate cancer on chemo, presented to ED for urinary retention.Required foley with out[ut around 900 cc, found to have AKI with creatinine 1.87   Consultants None   Procedures/Events None

## 2023-08-04 DIAGNOSIS — R338 Other retention of urine: Secondary | ICD-10-CM | POA: Diagnosis not present

## 2023-08-04 DIAGNOSIS — N179 Acute kidney failure, unspecified: Secondary | ICD-10-CM | POA: Diagnosis not present

## 2023-08-04 DIAGNOSIS — C61 Malignant neoplasm of prostate: Secondary | ICD-10-CM | POA: Diagnosis not present

## 2023-08-04 LAB — BASIC METABOLIC PANEL WITH GFR
Anion gap: 9 (ref 5–15)
BUN: 18 mg/dL (ref 8–23)
CO2: 24 mmol/L (ref 22–32)
Calcium: 8.4 mg/dL — ABNORMAL LOW (ref 8.9–10.3)
Chloride: 104 mmol/L (ref 98–111)
Creatinine, Ser: 1.15 mg/dL (ref 0.61–1.24)
GFR, Estimated: >60 mL/min (ref >60)
Glucose, Bld: 97 mg/dL (ref 70–99)
Potassium: 4.8 mmol/L (ref 3.5–5.1)
Sodium: 137 mmol/L (ref 135–145)

## 2023-08-04 NOTE — Plan of Care (Signed)
   Problem: Clinical Measurements: Goal: Ability to maintain clinical measurements within normal limits will improve Outcome: Progressing   Problem: Pain Managment: Goal: General experience of comfort will improve and/or be controlled Outcome: Progressing

## 2023-08-04 NOTE — Discharge Instructions (Signed)
 Ask Dr Willye Harvey when foley should be removed and if you need an antibiotic 24 hours prior to removal

## 2023-08-04 NOTE — Progress Notes (Signed)
 AVS reviewed w/ pt and spouse, son also present. Pt to call Dr Willye Harvey tomorrow to set up follow up appointment for foley removal. Extensive teaching and foley care taught to pt & wife. Stat lok removed, leg strap placed by this RN. Current foley bag changed to large foley bag, extra bag & leg strap  included in d/c items- leg bag reviewed w/  pt & wife , at this time they do not plan on changing to leg bag. No other questions at this time- PIV removed as noted. Pt home w/ wife & son.

## 2023-08-04 NOTE — Discharge Summary (Signed)
 Physician Discharge Summary   Patient: Johnny Davenport MRN: [id number] DOB: [birth year]  Admit date:     08/03/2023  Discharge date: 08/04/23  Discharge Physician: Jerline Moon   PCP: Kaylee Partridge, MD   Recommendations at discharge:   Follow-up acute urinary retention, Foley catheter in place on discharge.  Eventual voiding trial as outpatient. Continue chemotherapy as per Dr. Alita Irwin Possible bladder abnormality versus artifact, see discussion below  Discharge Diagnoses: Principal Problem:   AKI (acute kidney injury) (HCC) Active Problems:   Prostate cancer metastatic to multiple sites [facility])   Acute urinary retention   Hyponatremia   Aortic atherosclerosis (HCC)  Resolved Problems:   * No resolved hospital problems. *  Hospital Course: 73 y/o male with metastatic prostate cancer on chemo, presented to ED for urinary retention.Required foley with output around 900 cc, found to have AKI with creatinine 1.87.  Admitted for observation.  Creatinine substantially improved on follow-up.  Stable for discharge home with Foley catheter and leg bag.  Follow-up with chemotherapy tomorrow with Dr. Willye Harvey in the near future.  Consultants None   Procedures/Events None   Acute urinary retention Metastatic prostate cancer currently undergoing chemotherapy Foley catheter placed in the emergency department. Keep Foley catheter on discharge Outpatient follow-up with Dr. Willye Harvey and Dr. Alita Irwin.   Acute kidney injury secondary to above Baseline creatinine around 0.9. Admit creatinine 1.87 Treated with IV fluids.  Renal function now near baseline.  Expect spontaneous resolution with Foley catheter in place.   Mild hyponatremia Resolved    Possible bladder abnormality Possible mild asymmetric posterior bladder wall thickening right greater than left versus asymmetric early pooling of dependent contrast material. Recommend correlation with urinalysis and consider urology protocol CT  versus cystoscopy for further evaluation. Follow-up Dr. Willye Harvey in the near future  Leukocytosis Seen earlier in the month.  Suspect related to oncology medications. No signs or symptoms of infection.  Follow-up as an outpatient.  Stable normocytic anemia Likely related to chemotherapy, chronic disease   Aortic atherosclerosis      Disposition: Home Diet recommendation:  Regular diet DISCHARGE MEDICATION: Allergies as of 08/04/2023       Reactions   Other Other (See Comments), Cough   Pollen From Flowers = Coughing, itchy eyes, sneezing, possible wheezing        Medication List     TAKE these medications    Advil 200 MG tablet Generic drug: ibuprofen Take 200-400 mg by mouth every 6 (six) hours as needed (for headaches or mild pain).   CALCIUM  + D3 PO Take 1 tablet by mouth 2 (two) times daily with a meal.   Claritin 10 MG tablet Generic drug: loratadine Take 10 mg by mouth See admin instructions. Starting on the day of the injection, take 10 mg by mouth once a day for 7 days.   clobetasol  0.05 % Gel Commonly known as: TEMOVATE  Dry canker sore with a tissue or cloth and then apply a small amount of ointment three times a day   dexamethasone  4 MG tablet Commonly known as: DECADRON  Take 2 tabs by mouth 2 times daily starting day before chemo. Then take 2 tabs daily for 2 days starting day after chemo. Take with food. What changed:  how much to take how to take this when to take this additional instructions   Dulcolax 5 MG EC tablet Generic drug: bisacodyl Take 5 mg by mouth in the morning, at noon, and at bedtime.   fluticasone  50 MCG/ACT nasal spray Commonly  known as: FLONASE  Place 1 spray into both nostrils See admin instructions. Instill 1 spray into each nostril once a day as needed for allergies or rhinitis- for four to five consecutive days at a time, when using   Nubeqa  300 MG tablet Generic drug: darolutamide  Take 2 tablets (600 mg total) by  mouth 2 (two) times daily with a meal.   omeprazole  20 MG capsule Commonly known as: PRILOSEC Take 1 capsule (20 mg total) by mouth daily. What changed: when to take this   ondansetron  8 MG tablet Commonly known as: Zofran  Take 1 tablet (8 mg total) by mouth every 8 (eight) hours as needed for nausea or vomiting.   Orgovyx  120 MG tablet Generic drug: relugolix  Take 1 tablet (120 mg total) by mouth daily. What changed: when to take this   OVER THE COUNTER MEDICATION Take 5 mg by mouth See admin instructions. [employer] Laxative/Comfort Coated Tablets - Take 5 mg by mouth once a day AS NEEDED for constipation   pramoxine 1 % foam Commonly known as: Proctofoam Place 1 Application rectally 3 (three) times daily as needed for anal itching.   prochlorperazine  10 MG tablet Commonly known as: COMPAZINE  Take 1 tablet (10 mg total) by mouth every 6 (six) hours as needed for nausea or vomiting (take twice daily on days 1 to 3 of chemotherapy, then follow as needed instruction if feeling fine by day 4).   silodosin 8 MG Caps capsule Commonly known as: RAPAFLO Take 8 mg by mouth daily with breakfast.   solifenacin 10 MG tablet Commonly known as: VESICARE Take 10 mg by mouth daily with supper.   SUMAtriptan  50 MG tablet Commonly known as: IMITREX  TAKE ONE TABLET BY MOUTH AT ONSET OF MIGRAINE. MAY REPEAT DOSE WITH ONE TABLET IN TWO HOURS IF NEEDED. DO NOT EXCEED TWO TABLETS IN 24 HOURS. What changed: See the new instructions.   TYLENOL 500 MG tablet Generic drug: acetaminophen Take 500 mg by mouth every 6 (six) hours as needed (for headaches or mild pain).        Follow-up Information     Stoneking, Ponce Brisker., MD. Schedule an appointment as soon as possible for a visit in 2 week(s).   Specialty: Urology Contact information: [address] [place] [address] [phone]         Lowanda Ruddy, MD Follow up.   Specialty: Oncology Why: Keep chemotherapy  follow-up Contact information: [address] [place] [address] [phone]                Hungry.  Doing well.  Discharge Exam: Filed Weights   08/03/23 0820  Weight: 81.6 kg   Physical Exam Vitals reviewed.  Constitutional:      General: He is not in acute distress.    Appearance: He is not ill-appearing or toxic-appearing.  Cardiovascular:     Rate and Rhythm: Normal rate and regular rhythm.     Heart sounds: No murmur heard. Pulmonary:     Effort: Pulmonary effort is normal. No respiratory distress.     Breath sounds: No wheezing, rhonchi or rales.  Neurological:     Mental Status: He is alert.  Psychiatric:        Mood and Affect: Mood normal.        Behavior: Behavior normal.      UOP 3900 Creatinine down to 1.15  Condition at discharge: good  The results of significant diagnostics from this hospitalization (including imaging,  microbiology, ancillary and laboratory) are listed below for reference.   Imaging Studies: CT ABDOMEN PELVIS W CONTRAST Result Date: 08/03/2023 CLINICAL DATA:  Generalized abdominal pain with possible constipation. Known metastatic prostate cancer. EXAM: CT ABDOMEN AND PELVIS WITH CONTRAST TECHNIQUE: Multidetector CT imaging of the abdomen and pelvis was performed using the standard protocol following bolus administration of intravenous contrast. RADIATION DOSE REDUCTION: This exam was performed according to the departmental dose-optimization program which includes automated exposure control, adjustment of the mA and/or kV according to patient size and/or use of iterative reconstruction technique. CONTRAST:  70mL OMNIPAQUE  IOHEXOL  300 MG/ML  SOLN COMPARISON:  PET-CT 03/31/2023 and chest CT 07/23/2023 FINDINGS: Lower chest: Lung bases are clear. Hepatobiliary: Subcentimeter hypodensity over the left lobe of the liver unchanged likely a cyst. Liver, gallbladder and biliary tree are otherwise unremarkable. Pancreas: Normal. Spleen:  Normal. Adrenals/Urinary Tract: Adrenal glands are normal. Kidneys are normal in size without focal mass or nephrolithiasis. There is interval development of mild bilateral hydronephrosis with dilatation of the ureters. Mild associated bladder distension as findings could be due to a degree of bladder outlet obstruction. Possible mild asymmetric posterior bladder wall thickening right greater than left versus asymmetric early pooling of dependent contrast material. Stomach/Bowel: Stomach and small bowel are normal. Appendix is normal. Mild fecal retention over the transverse and descending colon. Minimal diverticulosis of the sigmoid colon. Vascular/Lymphatic: Abdominal aorta is normal in caliber with minimal calcified plaque present. No adenopathy. Reproductive: Radiation seed implants over the prostate gland. Other: No free fluid or focal inflammatory change. Musculoskeletal: No metastatic disease throughout the pelvic bones and spine. Stable grade 1 anterolisthesis of L4 on L5. IMPRESSION: 1. Interval development of mild bilateral hydronephrosis with dilatation of the ureters. Mild associated bladder distension as findings could all be due to a degree of bladder outlet obstruction. Possible mild asymmetric posterior bladder wall thickening right greater than left versus asymmetric early pooling of dependent contrast material. Recommend correlation with urinalysis and consider urology protocol CT versus cystoscopy for further evaluation. 2. Mild fecal retention over the transverse and descending colon. Minimal diverticulosis of the sigmoid colon. 3. Stable subcentimeter hypodensity over the left lobe of the liver likely a cyst. 4. Radiation seed implants over the prostate gland. 5. Stable metastatic disease throughout the pelvic bones and spine. 6. Aortic atherosclerosis. Aortic Atherosclerosis (ICD10-I70.0). Electronically Signed   By: Roda Cirri M.D.   On: 08/03/2023 10:27   CT Angio Chest PE W and/or Wo  Contrast Result Date: 07/23/2023 CLINICAL DATA:  Short of breath, history of metastatic prostate cancer, dyspnea EXAM: CT ANGIOGRAPHY CHEST WITH CONTRAST TECHNIQUE: Multidetector CT imaging of the chest was performed using the standard protocol during bolus administration of intravenous contrast. Multiplanar CT image reconstructions and MIPs were obtained to evaluate the vascular anatomy. RADIATION DOSE REDUCTION: This exam was performed according to the departmental dose-optimization program which includes automated exposure control, adjustment of the mA and/or kV according to patient size and/or use of iterative reconstruction technique. CONTRAST:  75mL OMNIPAQUE  IOHEXOL  350 MG/ML SOLN COMPARISON:  04/08/2023, 10/25/2022, 07/23/2023 FINDINGS: Cardiovascular: This is a technically adequate evaluation of the pulmonary vasculature. No filling defects or pulmonary emboli. The heart is unremarkable without pericardial effusion. 4 cm ascending thoracic aortic aneurysm without evidence of dissection. Atherosclerosis of the aorta and coronary vasculature. Mediastinum/Nodes: Interval decrease in the mediastinal adenopathy noted on prior PET scan, with a residual precarinal lymph node measuring 9 mm in short axis, previously 15 mm. Remaining adenopathy has resolved. Thyroid,  trachea, and esophagus appear unremarkable. Lungs/Pleura: No acute airspace disease, effusion, or pneumothorax. Central airways are patent. Interval resolution of the pulmonary nodules noted on prior PET scan consistent with positive response to therapy. Upper Abdomen: No acute abnormality. Musculoskeletal: Numerous sclerotic regions within the spine and thoracic cage have developed, corresponding to areas of increased radiotracer uptake on recent PET scan, compatible with treated bone metastases. Sclerosis is most pronounced within the T7, T10, T11, and L1 vertebral bodies. No evidence of pathologic fracture. Reconstructed images demonstrate no  additional findings. Review of the MIP images confirms the above findings. IMPRESSION: 1. No evidence of pulmonary embolus. 2. No acute intrathoracic process. 3. Near complete resolution of mediastinal adenopathy, resolution of pulmonary nodularity, and interval development of sclerosis within known bone metastases, consistent with positive response to therapy in this patient with known metastatic prostate cancer. 4. Aortic Atherosclerosis (ICD10-I70.0). Coronary artery atherosclerosis. 5. 4 cm ascending thoracic aortic aneurysm, stable. Recommend annual imaging followup by CTA or MRA. This recommendation follows 2010 ACCF/AHA/AATS/ACR/ASA/SCA/SCAI/SIR/STS/SVM Guidelines for the Diagnosis and Management of Patients with Thoracic Aortic Disease. Circulation. 2010; 121: R604-V409. Aortic aneurysm NOS (ICD10-I71.9) Electronically Signed   By: Bobbye Burrow M.D.   On: 07/23/2023 17:40   DG Chest Portable 1 View Result Date: 07/23/2023 CLINICAL DATA:  Dyspnea, prostate cancer EXAM: PORTABLE CHEST 1 VIEW COMPARISON:  10/25/2022, 04/08/2023 FINDINGS: Single frontal view of the chest demonstrates an unremarkable cardiac silhouette. No acute airspace disease, effusion, or pneumothorax. The small pulmonary nodule seen on prior PET scan are not readily apparent by x-ray. There are no acute bony abnormalities. The bony metastases noted on recent PET scan are not readily apparent by radiograph. IMPRESSION: 1. No acute intrathoracic process. 2. The pulmonary, lymph node, and bone metastases seen on recent PET scan are not readily apparent by x-ray. Electronically Signed   By: Bobbye Burrow M.D.   On: 07/23/2023 15:03    Microbiology: Results for orders placed or performed during the hospital encounter of 07/23/23  Resp panel by RT-PCR (RSV, Flu A&B, Covid) Anterior Nasal Swab     Status: None   Collection Time: 07/23/23  4:08 PM   Specimen: Anterior Nasal Swab  Result Value Ref Range Status   SARS Coronavirus 2 by  RT PCR NEGATIVE NEGATIVE Final    Comment: (NOTE) SARS-CoV-2 target nucleic acids are NOT DETECTED.  The SARS-CoV-2 RNA is generally detectable in upper respiratory specimens during the acute phase of infection. The lowest concentration of SARS-CoV-2 viral copies this assay can detect is 138 copies/mL. A negative result does not preclude SARS-Cov-2 infection and should not be used as the sole basis for treatment or other patient management decisions. A negative result may occur with  improper specimen collection/handling, submission of specimen other than nasopharyngeal swab, presence of viral mutation(s) within the areas targeted by this assay, and inadequate number of viral copies(<138 copies/mL). A negative result must be combined with clinical observations, patient history, and epidemiological information. The expected result is Negative.  Fact Sheet for Patients:  BloggerCourse.com  Fact Sheet for Healthcare Providers:  SeriousBroker.it  This test is no t yet approved or cleared by the [place]  FDA and  has been authorized for detection and/or diagnosis of SARS-CoV-2 by FDA under an Emergency Use Authorization (EUA). This EUA will remain  in effect (meaning this test can be used) for the duration of the COVID-19 declaration under Section 564(b)(1) of the Act, 21 U.S.C.section 360bbb-3(b)(1), unless the authorization is terminated  or revoked  sooner.       Influenza A by PCR NEGATIVE NEGATIVE Final   Influenza B by PCR NEGATIVE NEGATIVE Final    Comment: (NOTE) The Xpert Xpress SARS-CoV-2/FLU/RSV plus assay is intended as an aid in the diagnosis of influenza from Nasopharyngeal swab specimens and should not be used as a sole basis for treatment. Nasal washings and aspirates are unacceptable for Xpert Xpress SARS-CoV-2/FLU/RSV testing.  Fact Sheet for Patients: BloggerCourse.com  Fact Sheet  for Healthcare Providers: SeriousBroker.it  This test is not yet approved or cleared by the [place]  FDA and has been authorized for detection and/or diagnosis of SARS-CoV-2 by FDA under an Emergency Use Authorization (EUA). This EUA will remain in effect (meaning this test can be used) for the duration of the COVID-19 declaration under Section 564(b)(1) of the Act, 21 U.S.C. section 360bbb-3(b)(1), unless the authorization is terminated or revoked.     Resp Syncytial Virus by PCR NEGATIVE NEGATIVE Final    Comment: (NOTE) Fact Sheet for Patients: BloggerCourse.com  Fact Sheet for Healthcare Providers: SeriousBroker.it  This test is not yet approved or cleared by the [place]  FDA and has been authorized for detection and/or diagnosis of SARS-CoV-2 by FDA under an Emergency Use Authorization (EUA). This EUA will remain in effect (meaning this test can be used) for the duration of the COVID-19 declaration under Section 564(b)(1) of the Act, 21 U.S.C. section 360bbb-3(b)(1), unless the authorization is terminated or revoked.  Performed at [facility], [address] [place], [place] [address]   Urine Culture     Status: None   Collection Time: 07/23/23  4:30 PM   Specimen: Urine, Clean Catch  Result Value Ref Range Status   Specimen Description   Final    URINE, CLEAN CATCH Performed at [facility], [address], [place], [place] [address]    Special Requests   Final    NONE Performed at [facility], [address] [place], [place] [address]    Culture   Final    NO GROWTH Performed at [facility], [address], [place], [place] [address]    Report Status 07/24/2023 FINAL  Final  Blood culture (routine x 2)     Status: None   Collection Time: 07/23/23  5:00 PM   Specimen: BLOOD  Result Value Ref Range Status    Specimen Description   Final    BLOOD RIGHT ANTECUBITAL Performed at [facility], [address], [place], [place] [address]    Special Requests   Final    BOTTLES DRAWN AEROBIC AND ANAEROBIC Blood Culture adequate volume Performed at [facility], [address], [place], [place] [address]    Culture   Final    NO GROWTH 5 DAYS Performed at [facility], [address], [place], [place] [address]    Report Status 07/28/2023 FINAL  Final  Blood culture (routine x 2)     Status: None   Collection Time: 07/23/23  5:03 PM   Specimen: BLOOD  Result Value Ref Range Status   Specimen Description   Final    BLOOD LEFT ANTECUBITAL Performed at [facility], [address] [place], [place] [address]    Special Requests   Final    BOTTLES DRAWN AEROBIC AND ANAEROBIC Blood Culture adequate volume Performed at [facility], [address], [place], [place] [address]    Culture   Final  NO GROWTH 5 DAYS Performed at [facility], [address], [place], [place] [address]    Report Status 07/28/2023 FINAL  Final    Labs: CBC: Recent Labs  Lab 07/31/23 0904 08/03/23 0840  WBC 19.8* 14.2*  NEUTROABS 17.9* 12.4*  HGB 8.7* 8.0*  HCT 26.4* 25.0*  MCV 96.0 97.3  PLT 360 436*   Basic Metabolic Panel: Recent Labs  Lab 08/03/23 0840 08/04/23 0431  NA 131* 137  K 4.8 4.8  CL 96* 104  CO2 23 24  GLUCOSE 127* 97  BUN 21 18  CREATININE 1.87* 1.15  CALCIUM  9.0 8.4*   Liver Function Tests: Recent Labs  Lab 08/03/23 0840  AST 21  ALT 11  ALKPHOS 111  BILITOT 0.6  PROT 6.3*  ALBUMIN 3.4*   CBG: No results for input(s): "GLUCAP" in the last 168 hours.  Discharge time spent: less than 30 minutes.  Signed: Jerline Moon, MD [facility] 08/04/2023

## 2023-08-05 ENCOUNTER — Other Ambulatory Visit

## 2023-08-05 ENCOUNTER — Telehealth: Payer: Self-pay

## 2023-08-05 ENCOUNTER — Inpatient Hospital Stay

## 2023-08-05 VITALS — BP 115/65 | HR 78 | Temp 97.3°F | Resp 20 | Wt 187.1 lb

## 2023-08-05 DIAGNOSIS — T451X5A Adverse effect of antineoplastic and immunosuppressive drugs, initial encounter: Secondary | ICD-10-CM

## 2023-08-05 DIAGNOSIS — Z5111 Encounter for antineoplastic chemotherapy: Secondary | ICD-10-CM | POA: Diagnosis not present

## 2023-08-05 DIAGNOSIS — N179 Acute kidney failure, unspecified: Secondary | ICD-10-CM

## 2023-08-05 DIAGNOSIS — D6481 Anemia due to antineoplastic chemotherapy: Secondary | ICD-10-CM | POA: Diagnosis not present

## 2023-08-05 DIAGNOSIS — C61 Malignant neoplasm of prostate: Secondary | ICD-10-CM | POA: Diagnosis not present

## 2023-08-05 DIAGNOSIS — N3289 Other specified disorders of bladder: Secondary | ICD-10-CM

## 2023-08-05 DIAGNOSIS — C7951 Secondary malignant neoplasm of bone: Secondary | ICD-10-CM | POA: Diagnosis not present

## 2023-08-05 DIAGNOSIS — Z79899 Other long term (current) drug therapy: Secondary | ICD-10-CM | POA: Diagnosis not present

## 2023-08-05 NOTE — Assessment & Plan Note (Addendum)
 Urinary retention resulted in hospitalization with Foley and AKI Will ask Urology for assistance Will hold solifenacin. Continue silodosin.  Fluid goal 60-70 oz perday

## 2023-08-05 NOTE — Transitions of Care (Post Inpatient/ED Visit) (Signed)
 08/05/2023  Name: Johnny Davenport MRN: [id number] DOB: 03/27/1950  Today's TOC FU Call Status: Today's TOC FU Call Status:: Successful TOC FU Call Completed TOC FU Call Complete Date: 08/05/23 Patient's Name and Date of Birth confirmed.  Transition Care Management Follow-up Telephone Call Date of Discharge: 08/04/23 Discharge Facility: [facility] Smalling [facility]) Type of Discharge: Inpatient Admission Primary Inpatient Discharge Diagnosis:: urine retention How have you been since you were released from the hospital?: Better Any questions or concerns?: No  Items Reviewed: Did you receive and understand the discharge instructions provided?: Yes Medications obtained,verified, and reconciled?: Yes (Medications Reviewed) Any new allergies since your discharge?: No Dietary orders reviewed?: Yes Do you have support at home?: Yes People in Home [RPT]: spouse  Medications Reviewed Today: Medications Reviewed Today     Reviewed by Darrall Ellison, LPN (Licensed Practical Nurse) on 08/05/23 at 1633  Med List Status: <None>   Medication Order Taking? Sig Documenting Provider Last Dose Status Informant  ADVIL 200 MG tablet [id number] No Take 200-400 mg by mouth every 6 (six) hours as needed (for headaches or mild pain). [provider] Unknown Active Self  Calcium  Carb-Cholecalciferol (CALCIUM  + D3 PO) [id number] No Take 1 tablet by mouth 2 (two) times daily with a meal. [provider] 08/03/2023 Morning Active Self  CLARITIN 10 MG tablet [id number] No Take 10 mg by mouth See admin instructions. Starting on the day of the injection, take 10 mg by mouth once a day for 7 days. [provider] Past Month Active Self  clobetasol  (TEMOVATE ) 0.05 % GEL [id number] No Dry canker sore with a tissue or cloth and then apply a small amount of ointment three times a day  Patient not taking: Reported on 08/03/2023   Copland, Skipper Dumas, MD Not Taking Active Self  darolutamide  (NUBEQA ) 300 MG  tablet [id number] No Take 2 tablets (600 mg total) by mouth 2 (two) times daily with a meal. Lowanda Ruddy, MD 08/03/2023 Morning Active Self  dexamethasone  (DECADRON ) 4 MG tablet [id number] No Take 2 tabs by mouth 2 times daily starting day before chemo. Then take 2 tabs daily for 2 days starting day after chemo. Take with food.  Patient taking differently: Take 8 mg by mouth See admin instructions. Take 8 mg (2 tablets) by mouth 2 times daily starting day before chemo. Then, take 8 mg (2 tablets) daily for 2 days starting day after chemo. Take with food.   Lowanda Ruddy, MD Past Month Active Self           Med Note Guido Leeks, Redmond Candle Jul 23, 2023  4:02 PM) Most recent infusion was on 07/15/2023  DULCOLAX 5 MG EC tablet [id number] No Take 5 mg by mouth in the morning, at noon, and at bedtime. [provider] 08/03/2023 Morning Active Self           Med Note Guido Leeks, Sheryn Doom Aug 03, 2023  3:29 PM) Patient takes 2 versions of Dulcolax (Bisacodyl)  fluticasone  (FLONASE ) 50 MCG/ACT nasal spray [id number] No Place 1 spray into both nostrils See admin instructions. Instill 1 spray into each nostril once a day as needed for allergies or rhinitis- for four to five consecutive days at a time, when using [provider] Unknown Active Self  omeprazole  (PRILOSEC) 20 MG capsule [id number] No Take 1 capsule (20 mg total) by mouth daily.  Patient taking differently: Take 20 mg by mouth daily before breakfast.   Copland, Camilo Cella  C, MD 08/03/2023 Morning Active Self  ondansetron  (ZOFRAN ) 8 MG tablet [id number] No Take 1 tablet (8 mg total) by mouth every 8 (eight) hours as needed for nausea or vomiting.  Patient not taking: Reported on 08/03/2023   Lowanda Ruddy, MD Not Taking Active Self           Med Note Guido Leeks, Redmond Candle Jul 23, 2023  4:02 PM)    OVER THE COUNTER MEDICATION [id number] No Take 5 mg by mouth See admin instructions. [employer] Laxative/Comfort Coated Tablets - Take 5 mg by  mouth once a day AS NEEDED for constipation [provider] Unknown Active Self           Med Note Guido Leeks, Lavonia Powers   Sun Aug 03, 2023  3:28 PM) Patient takes 2 versions of Dulcolax (Bisacodyl)  pramoxine (PROCTOFOAM) 1 % foam [id number] No Place 1 Application rectally 3 (three) times daily as needed for anal itching.  Patient not taking: Reported on 08/03/2023   Lowanda Ruddy, MD Not Taking Active Self  prochlorperazine  (COMPAZINE ) 10 MG tablet [id number] No Take 1 tablet (10 mg total) by mouth every 6 (six) hours as needed for nausea or vomiting (take twice daily on days 1 to 3 of chemotherapy, then follow as needed instruction if feeling fine by day 4).  Patient not taking: Reported on 08/03/2023   Lowanda Ruddy, MD Not Taking Active Self  relugolix  (ORGOVYX ) 120 MG tablet [id number] No Take 1 tablet (120 mg total) by mouth daily.  Patient taking differently: Take 120 mg by mouth daily with supper.   Lowanda Ruddy, MD 08/02/2023 Evening Active Self  silodosin (RAPAFLO) 8 MG CAPS capsule [id number] No Take 8 mg by mouth daily with breakfast. [provider] 08/03/2023 Morning Active Self  solifenacin (VESICARE) 10 MG tablet [id number] No Take 10 mg by mouth daily with supper. [provider] 08/02/2023 Evening Active Self  SUMAtriptan  (IMITREX ) 50 MG tablet [id number] No TAKE ONE TABLET BY MOUTH AT ONSET OF MIGRAINE. MAY REPEAT DOSE WITH ONE TABLET IN TWO HOURS IF NEEDED. DO NOT EXCEED TWO TABLETS IN 24 HOURS.  Patient taking differently: Take 50 mg by mouth See admin instructions. Take 50 mg by mouth at the onset of a migraine and may repeat ONCE in two hours, if no relief. Cannot exceed 2 tablets/24 hours.   Copland, Skipper Dumas, MD Unknown Active Self  TYLENOL 500 MG tablet [id number] No Take 500 mg by mouth every 6 (six) hours as needed (for headaches or mild pain). [provider] Unknown Active Self            Home Care and Equipment/Supplies: Were Home  Health Services Ordered?: NA Any new equipment or medical supplies ordered?: NA  Functional Questionnaire: Do you need assistance with bathing/showering or dressing?: No Do you need assistance with meal preparation?: No Do you need assistance with eating?: No Do you have difficulty maintaining continence: No Do you need assistance with getting out of bed/getting out of a chair/moving?: No Do you have difficulty managing or taking your medications?: No  Follow up appointments reviewed: PCP Follow-up appointment confirmed?: NA Specialist Hospital Follow-up appointment confirmed?: Yes Date of Specialist follow-up appointment?: 08/12/23 Follow-Up Specialty Provider:: uro Do you need transportation to your follow-up appointment?: No Do you understand care options if your condition(s) worsen?: Yes-patient verbalized understanding    SIGNATURE Darrall Ellison, LPN [facility] Nurse Health Advisor Direct Dial [phone]

## 2023-08-05 NOTE — Assessment & Plan Note (Addendum)
 New, unclear etiology. Urology evaluation.

## 2023-08-05 NOTE — Assessment & Plan Note (Addendum)
 Post Cycle 4 of docetaxel  with excellent response. PSA <0.1 We will follow-up PSA  Continue Relugolix  and darolutamide 

## 2023-08-05 NOTE — Assessment & Plan Note (Addendum)
 Stable Will monitor for bleeding.

## 2023-08-07 ENCOUNTER — Ambulatory Visit

## 2023-08-12 ENCOUNTER — Ambulatory Visit (INDEPENDENT_AMBULATORY_CARE_PROVIDER_SITE_OTHER): Admitting: Urology

## 2023-08-12 ENCOUNTER — Encounter: Payer: Self-pay | Admitting: Urology

## 2023-08-12 VITALS — BP 134/81 | HR 82 | Ht 72.0 in | Wt 180.0 lb

## 2023-08-12 DIAGNOSIS — R338 Other retention of urine: Secondary | ICD-10-CM | POA: Diagnosis not present

## 2023-08-12 DIAGNOSIS — C61 Malignant neoplasm of prostate: Secondary | ICD-10-CM | POA: Diagnosis not present

## 2023-08-12 MED ORDER — CIPROFLOXACIN HCL 500 MG PO TABS
500.0000 mg | ORAL_TABLET | Freq: Once | ORAL | Status: AC
  Administered 2023-08-12: 500 mg via ORAL

## 2023-08-12 NOTE — Progress Notes (Signed)
 Fill and Pull Catheter Removal  Patient is present today for a catheter removal.  Patient was cleaned and prepped in a sterile fashion of sterile water  was instilled into the bladder when the patient felt the urge to urinate, 9mL of water  was then drained from the balloon.  A 14FR foley cath was removed from the bladder no complications were noted .  Patient was then given some time to void on their own.  Patient can void  on their own after some time.  Patient tolerated well.  Performed by: Zalan Shidler CMA

## 2023-08-12 NOTE — Progress Notes (Signed)
 Assessment: 1. Acute urinary retention   2. Prostate cancer metastatic to multiple sites [facility])     Plan: I personally reviewed the patient's chart including provider notes, lab and imaging results. Foley removed today after successful voiding trial. Cipro  x 1 following catheter removal. Continue silodosin 8 mg daily. I would not recommend that he resume the solifenacin at this time. Return to office in 7-10 days with bladder scan.  Chief Complaint:  Chief Complaint  Patient presents with   Urinary Retention    History of Present Illness:  Johnny Davenport is a 73 y.o. male who is seen in consultation from Copland, Skipper Dumas, MD for evaluation of urinary retention and metastatic prostate cancer.  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 TRUS volume:  36 ml. He is status post brachytherapy in June 2024. PSA increased to 107 in February 2025.  PSMA PET scan showed diffuse bone and nonbulky nodal metastases.  He was started on Orgovyx  and Nubeqa  and docetaxel .  He is followed by Dr. Alita Irwin with medical oncology. His PSA decreased to 0.3 in April 2025. Most recent PSA from 07/31/2023: <0.1.  LUTS with urinary retention: He has a history of lower urinary tract symptoms.  He has been on silodosin for management.  Solifenacin was added in late April 2025. He was recently admitted to the hospital in May 2025 with AKI and urinary retention.  A Foley catheter was placed on 08/03/2023 with return of around 900 mL.  His creatinine was elevated to 1.87.  He was discharged home with a Foley catheter in place.  Repeat creatinine from 08/04/2023 decreased to 1.15. His solifenacin was discontinued.  He continued on silodosin.  CT abdomen and pelvis with contrast from 08/03/2023 showed mild bilateral hydronephrosis, mild associated bladder distention, possible mild asymmetric posterior bladder wall thickening.  No prior history of retention.   His catheter has been draining well.   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   Wears glasses     Past Surgical History:  Past Surgical History:  Procedure Laterality Date   COLONOSCOPY  2021   TA/hems-5 yr recall   Hand/Finger surgery  1997   KNEE ARTHROSCOPY W/ MENISCAL REPAIR Right    x 2   POLYPECTOMY  2016   TA   PROSTATE BIOPSY     RADIOACTIVE SEED IMPLANT N/A 08/13/2022   Procedure: RADIOACTIVE SEED IMPLANT/BRACHYTHERAPY IMPLANT;  Surgeon: Sherlyn Ditto, MD;  Location: [facility];  Service: Urology;  Laterality: N/A;  90 MINS   ROTATOR CUFF REPAIR Left    SPACE OAR INSTILLATION N/A 08/13/2022   Procedure: SPACE OAR INSTILLATION;  Surgeon: Sherlyn Ditto, MD;  Location: [facility];  Service: Urology;  Laterality: N/A;   TONSILLECTOMY     WISDOM TOOTH EXTRACTION      Allergies:  Allergies  Allergen Reactions   Other Other (See Comments) and Cough    Pollen  From Flowers = Coughing, itchy eyes, sneezing, possible wheezing    Family History:  Family History  Problem Relation Age of Onset   Stroke Mother 65   Healthy Sister    Healthy Sister    Leukemia Cousin 15   Breast cancer Cousin 2   Lymphoma Cousin    Bladder Cancer Cousin 57   Breast cancer Other    Colon cancer Neg Hx    Colon polyps Neg Hx    Esophageal cancer Neg Hx    Stomach cancer Neg Hx    Rectal cancer Neg Hx     Social History:  Social History   Tobacco Use   Smoking status: Never   Smokeless tobacco: Never  Vaping Use   Vaping  status: Never Used  Substance Use Topics   Alcohol use: Not Currently    Alcohol/week: 1.0 standard drink of alcohol    Types: 1 Standard drinks or equivalent per week   Drug use: No    Review of symptoms:  Constitutional:  Negative for unexplained weight loss, night sweats, fever, chills ENT:  Negative for nose bleeds, sinus pain, painful swallowing CV:  Negative for chest pain, shortness of breath, exercise intolerance, palpitations, loss of consciousness Resp:  Negative for cough, wheezing, shortness of breath GI:  Negative for nausea, vomiting, diarrhea, bloody stools GU:  Positives noted in HPI; otherwise negative for gross hematuria, dysuria, urinary incontinence Neuro:  Negative for seizures, poor balance, limb weakness, slurred speech Psych:  Negative for lack of energy, depression, anxiety Endocrine:  Negative for polydipsia, polyuria, symptoms of hypoglycemia (dizziness, hunger, sweating) Hematologic:  Negative for anemia, purpura, petechia, prolonged or excessive bleeding, use of anticoagulants  Allergic:  Negative for difficulty breathing or choking as a result of exposure to anything; no shellfish allergy; no allergic response (rash/itch) to materials, foods  Physical exam: BP 134/81   Pulse 82   Ht 6' (1.829 m)   Wt 180 lb (81.6 kg)   BMI 24.41 kg/m  GENERAL APPEARANCE:  Well appearing, well developed, well nourished, NAD HEENT: Atraumatic, Normocephalic, oropharynx clear. NECK: Supple without lymphadenopathy or thyromegaly. LUNGS: Clear to auscultation bilaterally. HEART: Regular Rate and Rhythm without murmurs, gallops, or rubs. ABDOMEN: Soft, non-tender, No Masses. EXTREMITIES: Moves all extremities well.  Without clubbing, cyanosis, or edema. NEUROLOGIC:  Alert and oriented x 3, normal gait, CN II-XII grossly intact.  MENTAL STATUS:  Appropriate. BACK:  Non-tender to palpation.  No CVAT SKIN:  Warm, dry and intact.   GU:  foley draining clear  urine  Results: None  Procedure:  VOIDING TRIAL  A voiding trial was performed in the office today.   Volume of sterile water  instilled: 150 mL Foley catheter removed intact. Volume voided by patient: 150 mL Instructed to return to office if has not voided by 4 PM

## 2023-08-18 ENCOUNTER — Encounter: Payer: Self-pay | Admitting: Urology

## 2023-08-18 ENCOUNTER — Ambulatory Visit: Admitting: Urology

## 2023-08-18 VITALS — BP 139/77 | HR 90 | Ht 72.0 in | Wt 180.0 lb

## 2023-08-18 DIAGNOSIS — Z87898 Personal history of other specified conditions: Secondary | ICD-10-CM | POA: Diagnosis not present

## 2023-08-18 DIAGNOSIS — C61 Malignant neoplasm of prostate: Secondary | ICD-10-CM | POA: Diagnosis not present

## 2023-08-18 LAB — URINALYSIS, ROUTINE W REFLEX MICROSCOPIC
Bilirubin, UA: NEGATIVE
Glucose, UA: NEGATIVE
Ketones, UA: NEGATIVE
Leukocytes,UA: NEGATIVE
Nitrite, UA: NEGATIVE
Protein,UA: NEGATIVE
RBC, UA: NEGATIVE
Specific Gravity, UA: 1.01 (ref 1.005–1.030)
Urobilinogen, Ur: 0.2 mg/dL (ref 0.2–1.0)
pH, UA: 6.5 (ref 5.0–7.5)

## 2023-08-18 LAB — BLADDER SCAN AMB NON-IMAGING: Scan Result: 230

## 2023-08-18 NOTE — Progress Notes (Signed)
 Assessment: 1. Prostate cancer metastatic to multiple sites [facility])   2. History of urinary retention     Plan: Continue silodosin 8 mg daily. Return to office in 4 weeks for cystoscopy.  Chief Complaint:  Chief Complaint  Patient presents with   Prostate Cancer    History of Present Illness:  Johnny Davenport is a 73 y.o. male who is seen for further evaluation of urinary retention and metastatic prostate cancer.  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 TRUS volume:  36 ml. He is status post brachytherapy in June 2024. PSA increased to 107 in February 2025.  PSMA PET scan showed diffuse bone and nonbulky nodal metastases.  He was started on Orgovyx  and Nubeqa  and docetaxel .  He is followed by Dr. Alita Irwin with medical oncology. His PSA decreased to 0.3 in April 2025. Most recent PSA from 07/31/2023: <0.1.  LUTS with urinary retention: He has a history of lower urinary tract symptoms.  He has been on silodosin for management.  Solifenacin was added in late April 2025. He was recently admitted to the hospital in May 2025 with AKI and urinary retention.  A Foley catheter was placed on 08/03/2023 with return of around 900 mL.  His creatinine was elevated to 1.87.  He was discharged home with a Foley catheter in place.  Repeat creatinine from 08/04/2023 decreased to 1.15. His solifenacin was discontinued.  He continued on silodosin.  CT abdomen and pelvis with contrast from 08/03/2023 showed mild bilateral hydronephrosis, mild associated bladder distention, possible mild asymmetric posterior bladder wall thickening.  No prior history of retention.   His Foley catheter was removed on 08/12/2023 after a successful voiding trial in the office.  She returns today for follow-up.  He has been voiding spontaneously.  He continues to have a decreased stream with some urgency and urge incontinence.  He also has postvoid dribbling.  No dysuria  or gross hematuria.  He feels like his urinary symptoms are gradually improving. IPSS = 21/4.  Portions of the above documentation were copied from a prior visit for review purposes only.   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   Wears glasses     Past Surgical History:  Past Surgical History:  Procedure Laterality Date   COLONOSCOPY  2021   TA/hems-5 yr recall   Hand/Finger surgery  1997   KNEE ARTHROSCOPY W/ MENISCAL REPAIR Right    x 2   POLYPECTOMY  2016   TA   PROSTATE BIOPSY     RADIOACTIVE SEED IMPLANT N/A 08/13/2022   Procedure: RADIOACTIVE SEED IMPLANT/BRACHYTHERAPY IMPLANT;  Surgeon: Sherlyn Ditto, MD;  Location: [facility];  Service: Urology;  Laterality: N/A;  90 MINS   ROTATOR CUFF REPAIR Left    SPACE OAR INSTILLATION N/A 08/13/2022   Procedure: SPACE OAR INSTILLATION;  Surgeon: Sherlyn Ditto, MD;  Location: [facility];  Service: Urology;  Laterality: N/A;  TONSILLECTOMY     WISDOM TOOTH EXTRACTION      Allergies:  Allergies  Allergen Reactions   Other Other (See Comments) and Cough    Pollen From Flowers = Coughing, itchy eyes, sneezing, possible wheezing    Family History:  Family History  Problem Relation Age of Onset   Stroke Mother 52   Healthy Sister    Healthy Sister    Leukemia Cousin 15   Breast cancer Cousin 40   Lymphoma Cousin    Bladder Cancer Cousin 46   Breast cancer Other    Colon cancer Neg Hx    Colon polyps Neg Hx    Esophageal cancer Neg Hx    Stomach cancer Neg Hx     Rectal cancer Neg Hx     Social History:  Social History   Tobacco Use   Smoking status: Never   Smokeless tobacco: Never  Vaping Use   Vaping status: Never Used  Substance Use Topics   Alcohol use: Not Currently    Alcohol/week: 1.0 standard drink of alcohol    Types: 1 Standard drinks or equivalent per week   Drug use: No    ROS: Constitutional:  Negative for fever, chills, weight loss CV: Negative for chest pain, previous MI, hypertension Respiratory:  Negative for shortness of breath, wheezing, sleep apnea, frequent cough GI:  Negative for nausea, vomiting, bloody stool, GERD  Physical exam: BP 139/77   Pulse 90   Ht 6' (1.829 m)   Wt 180 lb (81.6 kg)   BMI 24.41 kg/m  GENERAL APPEARANCE:  Well appearing, well developed, well nourished, NAD HEENT:  Atraumatic, normocephalic, oropharynx clear NECK:  Supple without lymphadenopathy or thyromegaly ABDOMEN:  Soft, non-tender, no masses EXTREMITIES:  Moves all extremities well, without clubbing, cyanosis, or edema NEUROLOGIC:  Alert and oriented x 3, normal gait, CN II-XII grossly intact MENTAL STATUS:  appropriate BACK:  Non-tender to palpation, No CVAT SKIN:  Warm, dry, and intact GU: Prostate: 30 g, NT, firm Rectum: Normal tone,  no masses or tenderness   Results: U/A:  negative  PVR = 230 ml

## 2023-08-21 ENCOUNTER — Encounter: Admitting: Urology

## 2023-08-25 ENCOUNTER — Other Ambulatory Visit: Payer: Self-pay

## 2023-08-25 NOTE — Progress Notes (Signed)
 Specialty Pharmacy Ongoing Clinical Assessment Note  Johnny Davenport is a 73 y.o. male who is being followed by the specialty pharmacy service for RxSp Oncology   Patient's specialty medication(s) reviewed today: Darolutamide  (Nubeqa ); Relugolix  (ORGOVYX )   Missed doses in the last 4 weeks: 0   Patient/Caregiver did not have any additional questions or concerns.   Therapeutic benefit summary: Patient is achieving benefit   Adverse events/side effects summary: No adverse events/side effects   Patient's therapy is appropriate to: Continue    Goals Addressed             This Visit's Progress    Maintain optimal adherence to therapy   On track    Patient is on track. Patient will maintain adherence         Follow up: 3 months  [facility] Specialty Pharmacist

## 2023-08-25 NOTE — Progress Notes (Signed)
 Specialty Pharmacy Refill Coordination Note  Johnny Davenport is a 73 y.o. male contacted today regarding refills of specialty medication(s) Darolutamide  (Nubeqa ); Relugolix  (ORGOVYX )   Patient requested Delivery   Delivery date: 08/29/23   Verified address: [address] [place], [place] [address]   Medication will be filled on 08/27/23.

## 2023-08-26 ENCOUNTER — Ambulatory Visit

## 2023-08-26 ENCOUNTER — Other Ambulatory Visit: Payer: Self-pay

## 2023-08-26 ENCOUNTER — Encounter: Admitting: Dietician

## 2023-08-26 ENCOUNTER — Inpatient Hospital Stay

## 2023-08-26 DIAGNOSIS — C7800 Secondary malignant neoplasm of unspecified lung: Secondary | ICD-10-CM | POA: Diagnosis not present

## 2023-08-26 DIAGNOSIS — C61 Malignant neoplasm of prostate: Secondary | ICD-10-CM | POA: Diagnosis present

## 2023-08-26 DIAGNOSIS — C7951 Secondary malignant neoplasm of bone: Secondary | ICD-10-CM | POA: Insufficient documentation

## 2023-08-26 DIAGNOSIS — T451X5A Adverse effect of antineoplastic and immunosuppressive drugs, initial encounter: Secondary | ICD-10-CM | POA: Diagnosis not present

## 2023-08-26 DIAGNOSIS — D6481 Anemia due to antineoplastic chemotherapy: Secondary | ICD-10-CM | POA: Insufficient documentation

## 2023-08-26 LAB — CBC WITH DIFFERENTIAL (CANCER CENTER ONLY)
Abs Immature Granulocytes: 0.01 10*3/uL (ref 0.00–0.07)
Basophils Absolute: 0 10*3/uL (ref 0.0–0.1)
Basophils Relative: 1 %
Eosinophils Absolute: 0.2 10*3/uL (ref 0.0–0.5)
Eosinophils Relative: 5 %
HCT: 29.9 % — ABNORMAL LOW (ref 39.0–52.0)
Hemoglobin: 9.4 g/dL — ABNORMAL LOW (ref 13.0–17.0)
Immature Granulocytes: 0 %
Lymphocytes Relative: 15 %
Lymphs Abs: 0.5 10*3/uL — ABNORMAL LOW (ref 0.7–4.0)
MCH: 31.5 pg (ref 26.0–34.0)
MCHC: 31.4 g/dL (ref 30.0–36.0)
MCV: 100.3 fL — ABNORMAL HIGH (ref 80.0–100.0)
Monocytes Absolute: 0.3 10*3/uL (ref 0.1–1.0)
Monocytes Relative: 11 %
Neutro Abs: 2.2 10*3/uL (ref 1.7–7.7)
Neutrophils Relative %: 68 %
Platelet Count: 257 10*3/uL (ref 150–400)
RBC: 2.98 MIL/uL — ABNORMAL LOW (ref 4.22–5.81)
RDW: 16.3 % — ABNORMAL HIGH (ref 11.5–15.5)
WBC Count: 3.2 10*3/uL — ABNORMAL LOW (ref 4.0–10.5)
nRBC: 0 % (ref 0.0–0.2)

## 2023-08-26 LAB — COMPREHENSIVE METABOLIC PANEL WITH GFR
ALT: 8 U/L (ref 0–44)
AST: 14 U/L — ABNORMAL LOW (ref 15–41)
Albumin: 3.5 g/dL (ref 3.5–5.0)
Alkaline Phosphatase: 65 U/L (ref 38–126)
Anion gap: 4 — ABNORMAL LOW (ref 5–15)
BUN: 20 mg/dL (ref 8–23)
CO2: 30 mmol/L (ref 22–32)
Calcium: 8.9 mg/dL (ref 8.9–10.3)
Chloride: 107 mmol/L (ref 98–111)
Creatinine, Ser: 0.91 mg/dL (ref 0.61–1.24)
GFR, Estimated: >60 mL/min (ref >60)
Glucose, Bld: 76 mg/dL (ref 70–99)
Potassium: 4.1 mmol/L (ref 3.5–5.1)
Sodium: 141 mmol/L (ref 135–145)
Total Bilirubin: 0.4 mg/dL (ref 0.0–1.2)
Total Protein: 6 g/dL — ABNORMAL LOW (ref 6.5–8.1)

## 2023-08-27 ENCOUNTER — Other Ambulatory Visit (HOSPITAL_COMMUNITY): Payer: Self-pay

## 2023-08-27 LAB — TESTOSTERONE: Testosterone: <3 ng/dL — ABNORMAL LOW (ref 264–916)

## 2023-08-27 LAB — PROSTATE-SPECIFIC AG, SERUM (LABCORP): Prostate Specific Ag, Serum: <0.1 ng/mL (ref 0.0–4.0)

## 2023-08-27 NOTE — Assessment & Plan Note (Addendum)
 Monitor for febrile neutropenia with symptoms and lab baseline bone mineral density study and then every 2 years calcium (1000-1200 mg daily from food and supplements) and vitamin D3 (1000 IU daily) Control and prevent diabetes Aggressive cardiovascular risk management Weight-bearing exercises (30 minutes per day) Limit alcohol consumption and avoid smoking

## 2023-08-27 NOTE — Assessment & Plan Note (Addendum)
 Improving  Will monitor for bleeding.

## 2023-08-27 NOTE — Progress Notes (Signed)
 [facility] OFFICE PROGRESS NOTE  Patient Care Team: Copland, Harlene BROCKS, MD as PCP - General (Family Medicine) Vertell Pont, RN as Oncology Nurse Navigator Manny, Ricardo KATHEE Raddle., MD as Consulting Physician (Urology) Patrcia Cough, MD as Consulting Physician (Radiation Oncology) Starla Wendelyn BIRCH, RN as Registered Nurse Crawford, Morna Pickle, NP as Nurse Practitioner (Hematology and Oncology)  Johnny Davenport is a 73 y.o.male with history of BPH, arthritis, hyperlipidemia, prostate cancer being [facility] for recurrent prostate cancer.  Currently on active treatment with docetaxel  triplet therapy.   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  Germline testing: negative. Somatic testing: negative for MMR/MSI high. PD-L1 TPS 1%.  Treatment: s/p brachytherapy in 08/2022  Current treatment: Triple therapy with docetaxel , Relugolix  and darolutamide .  He gets monthly shipment of daro and relugolix  to his house.    Now completed C4. PSA <0.1.    Overall recovering better. Will continue ADT/ARPI. Follow up next month. Assessment & Plan Prostate cancer metastatic to multiple sites [facility]) Post Cycle 4 of docetaxel  with excellent response. PSA <0.1 We will follow-up PSA next month Plan for PET in Aug. Will order next time Continue Relugolix  and darolutamide  At risk for side effect of medication Monitor for febrile neutropenia with symptoms and lab baseline bone mineral density study and then every 2 years calcium  (1000-1200 mg daily from food and supplements) and vitamin D3 (1000 IU daily) Control and prevent diabetes Aggressive cardiovascular risk management Weight-bearing exercises (30 minutes per day) Limit alcohol consumption and avoid smoking Anemia due to antineoplastic chemotherapy Improving  Will monitor for bleeding.   Macrocytosis   Orders Placed This Encounter  Procedures   CBC with Differential (Cancer Center Only)    Standing Status:   Future    Expiration Date:   08/27/2024   CMP (Cancer Center only)    Standing Status:   Future    Expiration Date:   08/27/2024   Testosterone     Standing Status:   Future    Expiration Date:   08/27/2024   Prostate-Specific AG, Serum    Standing Status:   Future    Expiration Date:   08/27/2024     Johnny BROCKS Chihuahua, MD  INTERVAL HISTORY: Patient returns for follow-up. Report right ankle swelling sometimes when standing longer and resolved in the morning. No calf pain, short of breath, chest pain, stomach pain, diarrhea, rash, diarrhea.  Oncology History  History of prostate cancer  05/06/2022 Cancer Staging   Staging form: Prostate, AJCC 8th Edition - Clinical stage from 05/06/2022: Stage IIB (cT2, cN0, cM0, PSA: 7.3, Grade Group: 2) - Signed by Sherwood Rise, PA-C on 07/04/2022 Histopathologic type: Adenocarcinoma, NOS Stage prefix: Initial diagnosis Prostate specific antigen (PSA) range: Less than 10 Gleason primary pattern: 3 Gleason secondary pattern: 4 Gleason score: 7 Histologic grading system: 5 grade system Number of biopsy cores examined: 12 Number of biopsy cores positive: 4 Location of positive needle core biopsies: One side   05/28/2022 Initial Diagnosis   Malignant neoplasm of prostate   03/27/2023 Imaging   MRI L Hip Heterogeneous osseous metastasis involving the pelvis involving the L4 and L5 vertebral bodies Probable extraosseous extension and small pathologic fracture of left ischial tuberosity with cortical disruption laterally.  Moderate sized partial-thickness tear of the adjacent left hamstring tendon origin Bone marrow edema surrounding the left SI joint with mild  edema type signal along the superior margin of the left piriformis muscle.  Finding could be secondary to extraosseous extension of metastasis or represent posttraumatic  change Small partial-thickness tear in the left gluteus muscle minimus tendon at the insertion on the greater trochanter with associated reactive bone marrow edema in the superior greater trochanter. Small low signal intensity focus along the insertional fibers of the left gluteus tendons which could represent calcium  hydroxyapatite deposit Mild osteoarthritis of both. Possible enlarged left internal iliac/perirectal lymph node measuring 1.8 cm   04/08/2023 PET scan   PSMA PET 1. Extensive radiotracer avid skeletal metastasis involving the axillary and appendicular skeleton. 2. Extensive radiotracer avid lymph node metastasis in the pelvis, retroperitoneum, and mediastinum. 3. Multiple radiotracer avid small pulmonary metastasis.   04/10/2023 Tumor Marker   PSA 107   05/14/2023 -  Chemotherapy   Patient is on Treatment Plan : PROSTATE Docetaxel  (73) q21d      Genetic Testing   Negative CancerNext-Expanded +RNAinsight panel. The CancerNext-Expanded gene panel offered by [facility] and includes sequencing, rearrangement, and RNA analysis for the following 76 genes: AIP, ALK, APC, ATM, AXIN2, BAP1, BARD1, BMPR1A, BRCA1, BRCA2, BRIP1, CDC73, CDH1, CDK4, CDKN1B, CDKN2A, CEBPA, CHEK2, CTNNA1, DDX41, DICER1, ETV6, FH, FLCN, GATA2, LZTR1, MAX, MBD4, MEN1, MET, MLH1, MSH2, MSH3, MSH6, MUTYH, NF1, NF2, NTHL1, PALB2, PHOX2B, PMS2, POT1, PRKAR1A, PTCH1, PTEN, RAD51C, RAD51D, RB1, RET, RUNX1, SDHA, SDHAF2, SDHB, SDHC, SDHD, SMAD4, SMARCA4, SMARCB1, SMARCE1, STK11, SUFU, TMEM127, TP53, TSC1, TSC2, VHL, and WT1 (sequencing and deletion/duplication); EGFR, HOXB13, KIT, MITF, PDGFRA, POLD1, and POLE (sequencing only); EPCAM and GREM1 (deletion/duplication only). Report date 06/28/23.    Prostate cancer metastatic to multiple sites [facility])  04/08/2023 PET scan   PSMA PET 1. Extensive radiotracer avid skeletal metastasis involving the axillary and appendicular skeleton. 2. Extensive radiotracer avid lymph node  metastasis in the pelvis, retroperitoneum, and mediastinum. 3. Multiple radiotracer avid small pulmonary metastasis.   04/09/2023 Initial Diagnosis   Prostate cancer metastatic to multiple sites [facility])   04/10/2023 Tumor Marker   PSA 107   04/10/2023 Cancer Staging   Staging form: Prostate, AJCC 8th Edition - Clinical stage from 04/10/2023: Stage IVB (rcT0, cN1, cM1c, PSA: 107, Grade Group: 2) - Signed by Patrcia Cough, MD on 04/11/2023 Stage prefix: Recurrence Prostate specific antigen (PSA) range: 20 or greater Gleason score: 7 Histologic grading system: 5 grade system      PHYSICAL EXAMINATION: ECOG PERFORMANCE STATUS: 0 - Asymptomatic  Vitals:   08/28/23 1317 08/28/23 1318  BP: 138/89 124/79  Pulse: 86   Resp: 17   Temp: 98.1 F (36.7 C)   SpO2: 100%    Filed Weights   08/28/23 1317  Weight: 183 lb 9.6 oz (83.3 kg)    GENERAL: alert, no distress and comfortable SKIN: skin color normal and no jaundice on exposed skin LYMPH:  no palpable cervical, axillary lymphadenopathy  LUNGS: clear to auscultation and percussion with normal breathing effort HEART: regular rate & rhythm  ABDOMEN: abdomen soft, non-tender and nondistended. Musculoskeletal: no edema  Relevant data reviewed during this visit included labs.

## 2023-08-27 NOTE — Assessment & Plan Note (Addendum)
 Post Cycle 4 of docetaxel  with excellent response. PSA <0.1 We will follow-up PSA next month Plan for PET in Aug. Will order next time Continue Relugolix  and darolutamide 

## 2023-08-28 ENCOUNTER — Inpatient Hospital Stay

## 2023-08-28 ENCOUNTER — Ambulatory Visit

## 2023-08-28 VITALS — BP 124/79 | HR 86 | Temp 98.1°F | Resp 17 | Ht 72.0 in | Wt 183.6 lb

## 2023-08-28 DIAGNOSIS — D7589 Other specified diseases of blood and blood-forming organs: Secondary | ICD-10-CM | POA: Diagnosis not present

## 2023-08-28 DIAGNOSIS — C7951 Secondary malignant neoplasm of bone: Secondary | ICD-10-CM | POA: Diagnosis not present

## 2023-08-28 DIAGNOSIS — Z9189 Other specified personal risk factors, not elsewhere classified: Secondary | ICD-10-CM | POA: Diagnosis not present

## 2023-08-28 DIAGNOSIS — C61 Malignant neoplasm of prostate: Secondary | ICD-10-CM

## 2023-08-28 DIAGNOSIS — C7800 Secondary malignant neoplasm of unspecified lung: Secondary | ICD-10-CM | POA: Diagnosis not present

## 2023-08-28 DIAGNOSIS — D6481 Anemia due to antineoplastic chemotherapy: Secondary | ICD-10-CM | POA: Diagnosis not present

## 2023-08-28 DIAGNOSIS — T451X5A Adverse effect of antineoplastic and immunosuppressive drugs, initial encounter: Secondary | ICD-10-CM

## 2023-09-01 ENCOUNTER — Telehealth: Payer: Self-pay

## 2023-09-01 NOTE — Telephone Encounter (Signed)
Scheduled appointments

## 2023-09-08 ENCOUNTER — Ambulatory Visit: Admission: EM | Admit: 2023-09-08 | Discharge: 2023-09-08 | Disposition: A | Discharge status: Home / Self Care

## 2023-09-08 ENCOUNTER — Encounter: Payer: Self-pay | Admitting: Emergency Medicine

## 2023-09-08 DIAGNOSIS — H938X1 Other specified disorders of right ear: Secondary | ICD-10-CM | POA: Diagnosis not present

## 2023-09-08 NOTE — ED Triage Notes (Signed)
 Pt c/o water  in right ear from swimming today. Denies pain

## 2023-09-08 NOTE — Discharge Instructions (Signed)
 Do not get water  in your ears Do not stick anything in your ears Follow-up with your PCP Return as needed

## 2023-09-08 NOTE — ED Provider Notes (Signed)
 [facility]    CSN: [id number] Arrival date & time: 09/08/23  1641      History   Chief Complaint No chief complaint on file.   HPI Johnny Davenport is a 73 y.o. male.   73 year old male patient, Johnny Davenport, presents to urgent care for evaluation and removal of water  in right ear due to swimming 2 hours earlier.  Patient denies any pain or trauma. No loss of hearing  The history is provided by the patient. No language interpreter was used.    Past Medical History:  Diagnosis Date  . Allergy    seasonal allergies  . Arthritis    bilateral thumbs  . BPH (benign prostatic hyperplasia)    on meds  . Elevated PSA   . GERD (gastroesophageal reflux disease)    with certain foods/OTC meds for tx  . Hyperlipidemia    on meds  . Incomplete bladder emptying 12/28/2019   11/22/2019  . migraine   . Prostate cancer (HCC)   . Prostate cancer metastatic to multiple sites (HCC) 05/28/2022  . Wears glasses     Patient Active Problem List   Diagnosis Date Noted  . Ear fullness, right 09/08/2023  . Bladder wall thickening 08/05/2023  . AKI (acute kidney injury) (HCC) 08/03/2023  . Acute urinary retention 08/03/2023  . Hyponatremia 08/03/2023  . Aortic atherosclerosis (HCC) 08/03/2023  . Overactive bladder 07/15/2023  . Genetic testing 07/03/2023  . Anemia due to antineoplastic chemotherapy 06/24/2023  . Acute cystitis 06/23/2023  . Ear infection 06/03/2023  . Mucositis 06/03/2023  . At risk for side effect of medication 04/11/2023  . Prostate cancer metastatic to multiple sites (HCC) 04/09/2023  . History of prostate cancer 05/28/2022  . Prediabetes 05/13/2022  . Aneurysm of thoracic aorta (HCC) 10/21/2021  . Lateral epicondylitis, left elbow 11/16/2020  . Arthritis of carpometacarpal [facility]) joint of right thumb 11/16/2020  . Dyslipidemia 09/30/2020    Past Surgical History:  Procedure Laterality Date  . COLONOSCOPY  2021   TA/hems-5 yr recall  . Hand/Finger  surgery  1997  . KNEE ARTHROSCOPY W/ MENISCAL REPAIR Right    x 2  . POLYPECTOMY  2016   TA  . PROSTATE BIOPSY    . RADIOACTIVE SEED IMPLANT N/A 08/13/2022   Procedure: RADIOACTIVE SEED IMPLANT/BRACHYTHERAPY IMPLANT;  Surgeon: Rosalind Zachary NOVAK, MD;  Location: [facility];  Service: Urology;  Laterality: N/A;  90 MINS  . ROTATOR CUFF REPAIR Left   . SPACE OAR INSTILLATION N/A 08/13/2022   Procedure: SPACE OAR INSTILLATION;  Surgeon: Rosalind Zachary NOVAK, MD;  Location: [facility];  Service: Urology;  Laterality: N/A;  . TONSILLECTOMY    . WISDOM TOOTH EXTRACTION         Home Medications    Prior to Admission medications   Medication Sig Start Date End Date Taking? Authorizing Provider  Calcium  Carb-Cholecalciferol (CALCIUM  + D3 PO) Take 1 tablet by mouth 2 (two) times daily with a meal.    [provider]  darolutamide  (NUBEQA ) 300 MG tablet Take 2 tablets (600 mg total) by mouth 2 (two) times daily with a meal. 04/15/23   Tina Pauletta BROCKS, MD  DULCOLAX 5 MG EC tablet Take 5 mg by mouth in the morning, at noon, and at bedtime.    [provider]  fluticasone  (FLONASE ) 50 MCG/ACT nasal spray Place 1 spray into both nostrils See admin instructions. Instill 1 spray into each nostril once a day as needed for  allergies or rhinitis- for four to five consecutive days at a time, when using    [provider]  omeprazole  (PRILOSEC) 20 MG capsule Take 1 capsule (20 mg total) by mouth daily. Patient taking differently: Take 20 mg by mouth daily before breakfast. 05/07/23   Copland, Harlene BROCKS, MD  OVER THE COUNTER MEDICATION Take 5 mg by mouth See admin instructions. [employer] Laxative/Comfort Coated Tablets - Take 5 mg by mouth once a day AS NEEDED for constipation    [provider]  relugolix  (ORGOVYX ) 120 MG tablet Take 1 tablet (120 mg total) by mouth daily. Patient taking differently: Take 120 mg by mouth daily with supper. 04/15/23    Tina Pauletta BROCKS, MD  silodosin (RAPAFLO) 8 MG CAPS capsule Take 8 mg by mouth daily with breakfast.    [provider]  SUMAtriptan  (IMITREX ) 50 MG tablet TAKE ONE TABLET BY MOUTH AT ONSET OF MIGRAINE. MAY REPEAT DOSE WITH ONE TABLET IN TWO HOURS IF NEEDED. DO NOT EXCEED TWO TABLETS IN 24 HOURS. Patient taking differently: Take 50 mg by mouth See admin instructions. Take 50 mg by mouth at the onset of a migraine and may repeat ONCE in two hours, if no relief. Cannot exceed 2 tablets/24 hours. 06/12/23   Copland, Harlene BROCKS, MD  TYLENOL  500 MG tablet Take 500 mg by mouth every 6 (six) hours as needed (for headaches or mild pain).    [provider]    Family History Family History  Problem Relation Age of Onset  . Stroke Mother 34  . Healthy Sister   . Healthy Sister   . Leukemia Cousin 15  . Breast cancer Cousin 55  . Lymphoma Cousin   . Bladder Cancer Cousin 85  . Breast cancer Other   . Colon cancer Neg Hx   . Colon polyps Neg Hx   . Esophageal cancer Neg Hx   . Stomach cancer Neg Hx   . Rectal cancer Neg Hx     Social History Social History   Tobacco Use  . Smoking status: Never  . Smokeless tobacco: Never  Vaping Use  . Vaping status: Never Used  Substance Use Topics  . Alcohol use: Not Currently    Alcohol/week: 1.0 standard drink of alcohol    Types: 1 Standard drinks or equivalent per week  . Drug use: No     Allergies   Other   Review of Systems Review of Systems  Constitutional:  Negative for fever.  HENT:         Ear fullness  All other systems reviewed and are negative.    Physical Exam Triage Vital Signs ED Triage Vitals  Encounter Vitals Group     BP 09/08/23 1653 119/82     Girls Systolic BP Percentile --      Girls Diastolic BP Percentile --      Boys Systolic BP Percentile --      Boys Diastolic BP Percentile --      Pulse Rate 09/08/23 1653 85     Resp 09/08/23 1653 17     Temp 09/08/23 1653 98 F (36.7 C)     Temp  Source 09/08/23 1653 Oral     SpO2 09/08/23 1653 96 %     Weight --      Height --      Head Circumference --      Peak Flow --      Pain Score 09/08/23 1654 0  Pain Loc --      Pain Education --      Exclude from Growth Chart --    No data found.  Updated Vital Signs BP 119/82 (BP Location: Right Arm)   Pulse 85   Temp 98 F (36.7 C) (Oral)   Resp 17   SpO2 96%   Visual Acuity Right Eye Distance:   Left Eye Distance:   Bilateral Distance:    Right Eye Near:   Left Eye Near:    Bilateral Near:     Physical Exam Vitals and nursing note reviewed.  HENT:     Head: Normocephalic.     Right Ear: Tympanic membrane normal.     Left Ear: Tympanic membrane normal.     Nose: Nose normal.     Mouth/Throat:     Lips: Pink.     Mouth: Mucous membranes are moist.     Pharynx: Oropharynx is clear. Uvula midline.   Cardiovascular:     Rate and Rhythm: Normal rate.  Pulmonary:     Effort: Pulmonary effort is normal.   Neurological:     General: No focal deficit present.     Mental Status: He is alert and oriented to person, place, and time.     GCS: GCS eye subscore is 4. GCS verbal subscore is 5. GCS motor subscore is 6.   Psychiatric:        Attention and Perception: Attention normal.        Mood and Affect: Mood normal.        Speech: Speech normal.        Behavior: Behavior is cooperative.      UC Treatments / Results  Labs (all labs ordered are listed, but only abnormal results are displayed) Labs Reviewed - No data to display  EKG   Radiology No results found.  Procedures Procedures (including critical care time)  Medications Ordered in UC Medications - No data to display  Initial Impression / Assessment and Plan / UC Course  I have reviewed the triage vital signs and the nursing notes.  Pertinent labs & imaging results that were available during my care of the patient were reviewed by me and considered in my medical decision making (see chart  for details).  Clinical Course as of 09/08/23 1903  Mon Sep 08, 2023  1720 Few drops of alcohol squeezed from sterile alcohol pad into patient's right ear as patient held head tilted to the left, pumped tragus few times, gave patient sterile gauze, as patient turned head back over to the right, patted ear dry, pt states feels better. [JD]    Clinical Course User Index [JD] Alvia Jablonski, Rilla, NP   Discussed exam findings and plan of care with patient, patient voiced improvement after few drops of alcohol placed in the right affected area, strict ER precautions given, patient verbalized understanding to this provider  Ddx: Right ear fullness Final Clinical Impressions(s) / UC Diagnoses   Final diagnoses:  Ear fullness, right     Discharge Instructions      Do not get water  in your ears Do not stick anything in your ears Follow-up with your PCP Return as needed     ED Prescriptions   None    PDMP not reviewed this encounter.   Aminta Rilla, NP 09/08/23 1904

## 2023-09-10 ENCOUNTER — Encounter: Payer: Self-pay | Admitting: Family Medicine

## 2023-09-10 DIAGNOSIS — E785 Hyperlipidemia, unspecified: Secondary | ICD-10-CM

## 2023-09-10 NOTE — Addendum Note (Signed)
 Addended by: WATT RAISIN C on: 09/10/2023 09:17 PM   Modules accepted: Orders

## 2023-09-11 ENCOUNTER — Other Ambulatory Visit (INDEPENDENT_AMBULATORY_CARE_PROVIDER_SITE_OTHER)

## 2023-09-11 ENCOUNTER — Telehealth: Payer: Self-pay | Admitting: Family Medicine

## 2023-09-11 ENCOUNTER — Other Ambulatory Visit

## 2023-09-11 ENCOUNTER — Other Ambulatory Visit: Payer: Self-pay

## 2023-09-11 DIAGNOSIS — E785 Hyperlipidemia, unspecified: Secondary | ICD-10-CM

## 2023-09-11 LAB — LIPID PANEL
Cholesterol: 171 mg/dL (ref <200)
HDL: 41 mg/dL (ref >=40)
LDL Cholesterol (Calc): 104 mg/dL — ABNORMAL HIGH
Non-HDL Cholesterol (Calc): 130 mg/dL — ABNORMAL HIGH (ref <130)
Total CHOL/HDL Ratio: 4.2 (calc) (ref <5.0)
Triglycerides: 145 mg/dL (ref <150)

## 2023-09-11 NOTE — Addendum Note (Signed)
 Addended by: TRUDY CURVIN RAMAN on: 09/11/2023 02:33 PM   Modules accepted: Orders

## 2023-09-12 ENCOUNTER — Encounter: Payer: Self-pay | Admitting: Family Medicine

## 2023-09-12 DIAGNOSIS — E785 Hyperlipidemia, unspecified: Secondary | ICD-10-CM

## 2023-09-12 DIAGNOSIS — M25551 Pain in right hip: Secondary | ICD-10-CM

## 2023-09-15 ENCOUNTER — Telehealth: Payer: Self-pay | Admitting: *Deleted

## 2023-09-15 NOTE — Telephone Encounter (Signed)
 Patient called to say PCP wants to put him on Simvastatin  or an injection PCSK-9 for cholesterol. His cholesterol is 171. Wants to know if Dr Tina thinks that would be any problem with his treatment.

## 2023-09-15 NOTE — Telephone Encounter (Signed)
 N/a

## 2023-09-16 ENCOUNTER — Encounter (INDEPENDENT_AMBULATORY_CARE_PROVIDER_SITE_OTHER): Payer: Self-pay

## 2023-09-16 ENCOUNTER — Other Ambulatory Visit: Payer: Self-pay | Admitting: Pharmacy Technician

## 2023-09-16 ENCOUNTER — Other Ambulatory Visit: Payer: Self-pay

## 2023-09-16 ENCOUNTER — Ambulatory Visit (INDEPENDENT_AMBULATORY_CARE_PROVIDER_SITE_OTHER)

## 2023-09-16 VITALS — Ht 72.0 in | Wt 183.0 lb

## 2023-09-16 DIAGNOSIS — Z0000 Encounter for general adult medical examination without abnormal findings: Secondary | ICD-10-CM

## 2023-09-16 MED ORDER — SIMVASTATIN 10 MG PO TABS: 10.0000 mg | ORAL_TABLET | Freq: Every day | ORAL | 3 refills | Status: DC

## 2023-09-16 NOTE — Telephone Encounter (Signed)
 Notified of message below that either treatment would be fine.

## 2023-09-16 NOTE — Progress Notes (Signed)
 Specialty Pharmacy Refill Coordination Note  Johnny Davenport is a 73 y.o. male contacted today regarding refills of specialty medication(s) Darolutamide  (Nubeqa ); Relugolix  (ORGOVYX )   Patient requested (Patient-Rptd) Delivery   Delivery date: 09/24/2023 Verified address: (Patient-Rptd) [address] [place], [place] [address]   Medication will be filled on 09/23/2023.

## 2023-09-16 NOTE — Patient Instructions (Signed)
 Mr. Johnny Davenport , Thank you for taking time out of your busy schedule to complete your Annual Wellness Visit with me. I enjoyed our conversation and look forward to speaking with you again next year. I, as well as your care team,  appreciate your ongoing commitment to your health goals. Please review the following plan we discussed and let me know if I can assist you in the future. Your Game plan/ To Do List     Follow up Visits: Next Medicare AWV with our clinical staff: In 1 year    Have you seen your provider in the last 6 months (3 months if uncontrolled diabetes)? Yes Next Office Visit with your provider: To be scheduled   Clinician Recommendations:  Aim for 30 minutes of exercise or brisk walking, 6-8 glasses of water , and 5 servings of fruits and vegetables each day.       This is a list of the screening recommended for you and due dates:  Health Maintenance  Topic Date Due   COVID-19 Vaccine (5 - 2024-25 season) 11/10/2022   Colon Cancer Screening  02/22/2023   Flu Shot  10/10/2023   Medicare Annual Wellness Visit  09/15/2024   DTaP/Tdap/Td vaccine (4 - Td or Tdap) 10/22/2027   Pneumococcal Vaccine for age over 41  Completed   Hepatitis C Screening  Completed   Hepatitis B Vaccine  Aged Out   HPV Vaccine  Aged Out   Meningitis B Vaccine  Aged Out   Zoster (Shingles) Vaccine  Discontinued    Advanced directives: (ACP Link)Information on Advanced Care Planning can be found at [place]  Secretary of [facility] Advance Health Care Directives Advance Health Care Directives. [email or web address].[email or web address]   Advance Care Planning is important because it:  [x]  Makes sure you receive the medical care that is consistent with your values, goals, and preferences  [x]  It provides guidance to your family and loved ones and reduces their decisional burden about whether or not they are making the right decisions based on your wishes.  Follow the link provided in your after visit summary or read over the paperwork  we have mailed to you to help you started getting your Advance Directives in place. If you need assistance in completing these, please reach out to us  so that we can help you!  See attachments for Preventive Care and Fall Prevention Tips.

## 2023-09-16 NOTE — Addendum Note (Signed)
 Addended by: WATT RAISIN C on: 09/16/2023 10:20 AM   Modules accepted: Orders

## 2023-09-16 NOTE — Progress Notes (Signed)
 Subjective:   Johnny Davenport is a 73 y.o. who presents for a Medicare Wellness preventive visit.  As a reminder, Annual Wellness Visits don't include a physical exam, and some assessments may be limited, especially if this visit is performed virtually. We may recommend an in-person follow-up visit with your provider if needed.  Visit Complete: Virtual I connected with  Fairy Ha on 09/16/23 by a video and audio enabled telemedicine application and verified that I am speaking with the correct person using two identifiers.  Patient Location: Home  Provider Location: Home Office  I discussed the limitations of evaluation and management by telemedicine. The patient expressed understanding and agreed to proceed.  Vital Signs: Because this visit was a virtual/telehealth visit, some criteria may be missing or patient reported. Any vitals not documented were not able to be obtained and vitals that have been documented are patient reported.  Persons Participating in Visit: Patient.  AWV Questionnaire: Yes: Patient Medicare AWV questionnaire was completed by the patient on 09/09/23; I have confirmed that all information answered by patient is correct and no changes since this date.  Cardiac Risk Factors include: advanced age (>62men, >44 women);dyslipidemia;male gender     Objective:    Today's Vitals   09/16/23 1346  Weight: 183 lb (83 kg)  Height: 6' (1.829 m)   Body mass index is 24.82 kg/m.     09/16/2023    1:51 PM 08/03/2023    4:00 PM 07/23/2023    2:12 PM 04/28/2023    6:40 AM 04/10/2023    7:57 AM 09/23/2022    8:22 AM 09/04/2022    9:54 AM  Advanced Directives  Does Patient Have a Medical Advance Directive? No Yes Yes Yes Yes Yes Yes  Type of Science writer of [place];Living will Healthcare Power of [place];Living will  Healthcare Power of [place];Living will Living will;Healthcare Power of Attorney  Does patient want to make  changes to medical advance directive?  No - Patient declined    No - Patient declined   Copy of Healthcare Power of Attorney in Chart?  No - copy requested    No - copy requested   Would patient like information on creating a medical advance directive? Yes (MAU/Ambulatory/Procedural Areas - Information given)          Current Medications (verified) Outpatient Encounter Medications as of 09/16/2023  Medication Sig   Calcium  Carb-Cholecalciferol (CALCIUM  + D3 PO) Take 1 tablet by mouth 2 (two) times daily with a meal.   darolutamide  (NUBEQA ) 300 MG tablet Take 2 tablets (600 mg total) by mouth 2 (two) times daily with a meal.   DULCOLAX 5 MG EC tablet Take 5 mg by mouth in the morning, at noon, and at bedtime.   omeprazole  (PRILOSEC) 20 MG capsule Take 1 capsule (20 mg total) by mouth daily. (Patient taking differently: Take 20 mg by mouth daily before breakfast.)   relugolix  (ORGOVYX ) 120 MG tablet Take 1 tablet (120 mg total) by mouth daily. (Patient taking differently: Take 120 mg by mouth daily with supper.)   silodosin (RAPAFLO) 8 MG CAPS capsule Take 8 mg by mouth daily with breakfast.   simvastatin  (ZOCOR ) 10 MG tablet Take 1 tablet (10 mg total) by mouth at bedtime.   SUMAtriptan  (IMITREX ) 50 MG tablet TAKE ONE TABLET BY MOUTH AT ONSET OF MIGRAINE. MAY REPEAT DOSE WITH ONE TABLET IN TWO HOURS IF NEEDED. DO NOT EXCEED TWO TABLETS IN 24 HOURS. (Patient  taking differently: Take 50 mg by mouth See admin instructions. Take 50 mg by mouth at the onset of a migraine and may repeat ONCE in two hours, if no relief. Cannot exceed 2 tablets/24 hours.)   TYLENOL  500 MG tablet Take 500 mg by mouth every 6 (six) hours as needed (for headaches or mild pain).   [DISCONTINUED] fluticasone  (FLONASE ) 50 MCG/ACT nasal spray Place 1 spray into both nostrils See admin instructions. Instill 1 spray into each nostril once a day as needed for allergies or rhinitis- for four to five consecutive days at a time, when using    [DISCONTINUED] OVER THE COUNTER MEDICATION Take 5 mg by mouth See admin instructions. [employer] Laxative/Comfort Coated Tablets - Take 5 mg by mouth once a day AS NEEDED for constipation   No facility-administered encounter medications on file as of 09/16/2023.    Allergies (verified) Patient has no active allergies.   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   Wears glasses    Past Surgical History:  Procedure Laterality Date   COLONOSCOPY  2021   TA/hems-5 yr recall   Hand/Finger surgery  1997   KNEE ARTHROSCOPY W/ MENISCAL REPAIR Right    x 2   POLYPECTOMY  2016   TA   PROSTATE BIOPSY     PROSTATE SURGERY  06/24   RADIOACTIVE SEED IMPLANT N/A 08/13/2022   Procedure: RADIOACTIVE SEED IMPLANT/BRACHYTHERAPY IMPLANT;  Surgeon: Rosalind Zachary NOVAK, MD;  [facility];  Service: Urology;  Laterality: N/A;  90 MINS   ROTATOR CUFF REPAIR Left    SPACE OAR INSTILLATION N/A 08/13/2022   Procedure: SPACE OAR INSTILLATION;  Surgeon: Rosalind Zachary NOVAK, MD;  Location: [facility];  Service: Urology;  Laterality: N/A;   TONSILLECTOMY     WISDOM TOOTH EXTRACTION     Family History  Problem Relation Age of Onset   Stroke Mother 73   Healthy Sister    Healthy Sister    Leukemia Cousin 15   Breast cancer Cousin 5   Lymphoma Cousin    Bladder Cancer Cousin 59   Breast cancer Other    Colon cancer Neg Hx    Colon polyps Neg Hx    Esophageal cancer Neg Hx    Stomach cancer Neg Hx    Rectal cancer Neg Hx    Social History   Socioeconomic History    Marital status: Married    Spouse name: Not on file   Number of children: Not on file   Years of education: Not on file   Highest education level: Master's degree (e.g., MA, MS, MEng, MEd, MSW, MBA)  Occupational History   Not on file  Tobacco Use   Smoking status: Never   Smokeless tobacco: Never  Vaping Use   Vaping status: Never Used  Substance and Sexual Activity   Alcohol use: Not Currently    Alcohol/week: 2.0 standard drinks of alcohol    Types: 1 Glasses of wine, 1 Standard drinks or equivalent  per week   Drug use: No   Sexual activity: Yes    Birth control/protection: None  Other Topics Concern   Not on file  Social History Narrative   Not on file   Social Drivers of Health   Financial Resource Strain: Low Risk  (09/09/2023)   Overall Financial Resource Strain (CARDIA)    Difficulty of Paying Living Expenses: Not hard at all  Food Insecurity: No Food Insecurity (09/09/2023)   Hunger Vital Sign    Worried About Running Out of Food in the Last Year: Never true    Ran Out of Food in the Last Year: Never true  Transportation Needs: No Transportation Needs (09/16/2023)   PRAPARE - Administrator, Civil Service (Medical): No    Lack of Transportation (Non-Medical): No  Physical Activity: Sufficiently Active (09/09/2023)   Exercise Vital Sign    Days of Exercise per Week: 6 days    Minutes of Exercise per Session: 50 min  Stress: No Stress Concern Present (09/09/2023)   [employer] of Occupational Health - Occupational Stress Questionnaire    Feeling of Stress: Only a little  Social Connections: Socially Integrated (09/09/2023)   Social Connection and Isolation Panel    Frequency of Communication with Friends and Family: More than three times a week    Frequency of Social Gatherings with Friends and Family: Twice a week    Attends Religious Services: More than 4 times per year    Active Member of [employer] or Organizations: Yes    Attends Banker  Meetings: 1 to 4 times per year    Marital Status: Married    Tobacco Counseling Counseling given: Not Answered    Clinical Intake:  Pre-visit preparation completed: Yes  Pain : No/denies pain     Diabetes: No  Lab Results  Component Value Date   HGBA1C 5.7 10/21/2022   HGBA1C 5.9 05/13/2022   HGBA1C 5.8 10/17/2021     How often do you need to have someone help you when you read instructions, pamphlets, or other written materials from your doctor or pharmacy?: 1 - Never  Interpreter Needed?: No  Information entered by :: Charmaine Bloodgood LPN   Activities of Daily Living     09/09/2023    8:46 AM 08/03/2023    4:00 PM  In your present state of health, do you have any difficulty performing the following activities:  Hearing? 0 0  Vision? 0 0  Difficulty concentrating or making decisions? 0 0  Walking or climbing stairs? 0   Dressing or bathing? 0   Doing errands, shopping? 0 0  Preparing Food and eating ? N   Using the Toilet? N   In the past six months, have you accidently leaked urine? N   Do you have problems with loss of bowel control? N   Managing your Medications? N   Managing your Finances? N   Housekeeping or managing your Housekeeping? N     Patient Care Team: Copland, Harlene BROCKS, MD as PCP - General (Family Medicine) Vertell Pont, RN as Oncology Nurse Navigator Manny, Ricardo KATHEE Raddle., MD as Consulting Physician (Urology) Patrcia Cough, MD as Consulting Physician (Radiation Oncology) Starla Wendelyn BIRCH, RN as Registered Nurse Causey, Morna Pickle, NP as Nurse Practitioner (Hematology and Oncology) Tina Pauletta BROCKS, MD as Consulting Physician (Oncology) Roseann, Adine PARAS., MD as Referring Physician (Urology)  I have updated your Care Teams any recent Medical Services you may have received from other providers  in the past year.     Assessment:   This is a routine wellness examination for Szymon.  Hearing/Vision screen Hearing Screening -  Comments:: Denies hearing difficulties   Vision Screening - Comments:: Wears rx glasses - up to date with routine eye exams with [facility]    Goals Addressed             This Visit's Progress    Maintain healthy active lifestyle.   On track      Depression Screen     09/16/2023    1:50 PM 10/21/2022    8:03 AM 09/23/2022    8:23 AM 05/27/2022    9:52 AM 04/23/2022    3:59 PM 03/06/2022    8:42 AM 10/17/2021    8:16 AM  PHQ 2/9 Scores  PHQ - 2 Score 0 0 0 0 0 0 0    Fall Risk     09/09/2023    8:46 AM 10/21/2022    8:03 AM 09/22/2022    9:08 AM 04/23/2022    3:59 PM 03/06/2022    8:41 AM  Fall Risk   Falls in the past year? 1 0 0 0 0  Number falls in past yr: 0 0 0 0 0  Injury with Fall? 0 0 0 0 0  Risk for fall due to : History of fall(s) No Fall Risks No Fall Risks No Fall Risks No Fall Risks  Follow up Education provided;Falls prevention discussed;Falls evaluation completed Falls evaluation completed Falls evaluation completed Falls evaluation completed Falls evaluation completed      Data saved with a previous flowsheet row definition    MEDICARE RISK AT HOME:  Medicare Risk at Home Any stairs in or around the home?: (Patient-Rptd) No If so, are there any without handrails?: (Patient-Rptd) No Home free of loose throw rugs in walkways, pet beds, electrical cords, etc?: (Patient-Rptd) No Adequate lighting in your home to reduce risk of falls?: (Patient-Rptd) Yes Life alert?: (Patient-Rptd) No Use of a cane, walker or w/c?: (Patient-Rptd) No Grab bars in the bathroom?: (Patient-Rptd) No Shower chair or bench in shower?: (Patient-Rptd) No Elevated toilet seat or a handicapped toilet?: (Patient-Rptd) No  TIMED UP AND GO:  Was the test performed?  No  Cognitive Function: Declined/Normal: No cognitive concerns noted by patient or family. Patient alert, oriented, able to answer questions appropriately and recall recent events. No signs of memory loss or  confusion.        09/23/2022    8:25 AM  6CIT Screen  What Year? 0 points  What month? 0 points  What time? 0 points  Count back from 20 0 points  Months in reverse 0 points  Repeat phrase 0 points  Total Score 0 points    Immunizations Immunization History  Administered Date(s) Administered   Fluad Trivalent(High Dose 65+) 11/28/2022   Hepatitis A, Adult 09/09/2007   Influenza Inj Mdck Quad Pf 12/18/2013   Influenza, High Dose Seasonal PF 12/26/2014, 12/24/2016   Influenza-Unspecified 01/13/2012, 12/23/2015, 12/10/2019, 01/04/2021, 12/18/2021   PFIZER(Purple Top)SARS-COV-2 Vaccination 04/02/2019, 04/22/2019, 12/24/2019, 12/23/2020   Pneumococcal Conjugate-13 11/10/2018   Pneumococcal Polysaccharide-23 12/24/2016, 12/10/2018   Td (Adult),5 Lf Tetanus Toxid, Preservative Free 10/21/2017   Tdap 09/09/2007, 07/09/2017   Zoster Recombinant(Shingrix) 08/28/2016, 11/08/2016    Screening Tests Health Maintenance  Topic Date Due   COVID-19 Vaccine (5 - 2024-25 season) 11/10/2022   Colonoscopy  02/22/2023   INFLUENZA VACCINE  10/10/2023   Medicare Annual Wellness (AWV)  09/15/2024   DTaP/Tdap/Td (4 - Td or Tdap) 10/22/2027   Pneumococcal Vaccine: 50+ Years  Completed   Hepatitis C Screening  Completed   Hepatitis B Vaccines  Aged Out   HPV VACCINES  Aged Out   Meningococcal B Vaccine  Aged Out   Zoster Vaccines- Shingrix  Discontinued    Health Maintenance  Health Maintenance Due  Topic Date Due   COVID-19 Vaccine (5 - 2024-25 season) 11/10/2022   Colonoscopy  02/22/2023    Additional Screening:  Vision Screening: Recommended annual ophthalmology exams for early detection of glaucoma and other disorders of the eye. Would you like a referral to an eye doctor? No    Dental Screening: Recommended annual dental exams for proper oral hygiene  Community Resource Referral / Chronic Care Management: CRR required this visit?  No   CCM required this visit?  No   Plan:     I have personally reviewed and noted the following in the patient's chart:   Medical and social history Use of alcohol, tobacco or illicit drugs  Current medications and supplements including opioid prescriptions. Patient is not currently taking opioid prescriptions. Functional ability and status Nutritional status Physical activity Advanced directives List of other physicians Hospitalizations, surgeries, and ER visits in previous 12 months Vitals Screenings to include cognitive, depression, and falls Referrals and appointments  In addition, I have reviewed and discussed with patient certain preventive protocols, quality metrics, and best practice recommendations. A written personalized care plan for preventive services as well as general preventive health recommendations were provided to patient.   Lavelle Pfeiffer [place], [place]   04/11/7972   After Visit Summary: (MyChart) Due to this being a telephonic visit, the after visit summary with patients personalized plan was offered to patient via MyChart   Notes: Nothing significant to report at this time.

## 2023-09-17 ENCOUNTER — Other Ambulatory Visit: Payer: Self-pay

## 2023-09-23 ENCOUNTER — Other Ambulatory Visit (HOSPITAL_COMMUNITY): Payer: Self-pay

## 2023-09-25 ENCOUNTER — Encounter: Payer: Self-pay | Admitting: Urology

## 2023-09-25 ENCOUNTER — Ambulatory Visit (INDEPENDENT_AMBULATORY_CARE_PROVIDER_SITE_OTHER): Admitting: Urology

## 2023-09-25 ENCOUNTER — Telehealth: Payer: Self-pay | Admitting: Urology

## 2023-09-25 VITALS — BP 145/87 | HR 88 | Ht 72.0 in | Wt 181.0 lb

## 2023-09-25 DIAGNOSIS — Z87898 Personal history of other specified conditions: Secondary | ICD-10-CM

## 2023-09-25 DIAGNOSIS — C61 Malignant neoplasm of prostate: Secondary | ICD-10-CM

## 2023-09-25 DIAGNOSIS — R9341 Abnormal radiologic findings on diagnostic imaging of renal pelvis, ureter, or bladder: Secondary | ICD-10-CM | POA: Diagnosis not present

## 2023-09-25 LAB — MICROSCOPIC EXAMINATION

## 2023-09-25 LAB — URINALYSIS, ROUTINE W REFLEX MICROSCOPIC
Bilirubin, UA: NEGATIVE
Glucose, UA: NEGATIVE
Ketones, UA: NEGATIVE
Leukocytes,UA: NEGATIVE
Nitrite, UA: NEGATIVE
Protein,UA: NEGATIVE
Specific Gravity, UA: 1.015 (ref 1.005–1.030)
Urobilinogen, Ur: 0.2 mg/dL (ref 0.2–1.0)
pH, UA: 5.5 (ref 5.0–7.5)

## 2023-09-25 MED ORDER — CIPROFLOXACIN HCL 500 MG PO TABS
500.0000 mg | ORAL_TABLET | Freq: Once | ORAL | Status: AC
  Administered 2023-09-25: 500 mg via ORAL

## 2023-09-25 NOTE — Telephone Encounter (Signed)
 Saw results on mychart from urine today and would like someone to go over the results of the microscopic with him.

## 2023-09-25 NOTE — Progress Notes (Signed)
 Assessment: 1. Prostate cancer metastatic to multiple sites [facility])   2. History of urinary retention   3. Abnormal CT scan, bladder     Plan: Cystoscopy did not show any abnormalities within the bladder.  The CT scan abnormality was likely due to underdistention of the bladder. Continued management of prostate cancer by Dr. Tina. Continue silodosin 8 mg daily. Cipro  x 1 following cystoscopy. Return to office in 6 months.  Chief Complaint:  Chief Complaint  Patient presents with   Cysto    History of Present Illness:  Johnny Davenport is a 73 y.o. male who is seen for further evaluation of abnormal CT scan of bladder, history of urinary retention and metastatic prostate cancer.  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 TRUS volume:  36 ml. He is status post brachytherapy in June 2024. PSA increased to 107 in February 2025.  PSMA PET scan showed diffuse bone and nonbulky nodal metastases.  He was started on Orgovyx  and Nubeqa  and docetaxel .  He is followed by Dr. Tina with medical oncology. His PSA decreased to 0.3 in April 2025. PSA from 07/31/2023: <0.1. PSA from 08/26/2023: <0.1  LUTS with urinary retention: He has a history of lower urinary tract symptoms.  He has been on silodosin for management.  Solifenacin was added in late April 2025. He was admitted to the hospital in May 2025 with AKI and urinary retention.  A Foley catheter was placed on 08/03/2023 with return of around 900 mL.  His creatinine was elevated to 1.87.  He was discharged home with a Foley catheter in place.  Repeat creatinine from 08/04/2023 decreased to 1.15. His solifenacin was discontinued.  He continued on silodosin.  CT abdomen and pelvis with contrast from 08/03/2023 showed mild bilateral hydronephrosis, mild associated bladder distention, possible mild asymmetric posterior bladder wall thickening.  No prior history of retention.   His Foley  catheter was removed on 08/12/2023 after a successful voiding trial in the office.  At his visit in June 2025, he was voiding spontaneously.  He continued to have a decreased stream with some urgency and urge incontinence.  He also had postvoid dribbling.  No dysuria or gross hematuria.  He felt like his urinary symptoms were gradually improving. IPSS = 21/4. Urinalysis was negative for blood.  He presents today for cystoscopy for evaluation of the bladder asymmetry noted on CT imaging. His lower urinary tract symptoms have improved.  He feels like his urinary stream is stronger.  He continues with some frequency and urgency.  No dysuria or gross hematuria.  He continues on silodosin. IPSS = 11/3.  Portions of the above documentation were copied from a prior visit for review purposes only.   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   Wears glasses     Past Surgical History:  Past Surgical History:  Procedure Laterality Date   COLONOSCOPY  2021   TA/hems-5 yr recall  Hand/Finger surgery  1997   KNEE ARTHROSCOPY W/ MENISCAL REPAIR Right    x 2   POLYPECTOMY  2016   TA   PROSTATE BIOPSY     PROSTATE SURGERY  06/24   RADIOACTIVE SEED IMPLANT N/A 08/13/2022   Procedure: RADIOACTIVE SEED IMPLANT/BRACHYTHERAPY IMPLANT;  Surgeon: Rosalind Zachary NOVAK, MD;  Location: [facility];  Service: Urology;  Laterality: N/A;  90 MINS   ROTATOR CUFF REPAIR Left    SPACE OAR INSTILLATION N/A 08/13/2022   Procedure: SPACE OAR INSTILLATION;  Surgeon: Rosalind Zachary NOVAK, MD;   Location[facility];  Service: Urology;  Laterality: N/A;   TONSILLECTOMY     WISDOM TOOTH EXTRACTION      Allergies:  No Known Allergies   Family History:  Family History  Problem Relation Age of Onset   Stroke Mother 61   Healthy Sister    Healthy Sister    Leukemia Cousin 15   Breast cancer Cousin 21   Lymphoma Cousin    Bladder Cancer Cousin 52   Breast cancer Other    Colon cancer Neg Hx    Colon polyps Neg Hx    Esophageal cancer Neg Hx    Stomach cancer Neg Hx    Rectal cancer Neg Hx     Social History:  Social History   Tobacco Use   Smoking status: Never   Smokeless tobacco: Never  Vaping Use   Vaping status: Never Used  Substance Use Topics   Alcohol use: Not Currently    Alcohol/week: 2.0 standard drinks of alcohol    Types: 1 Glasses of wine, 1 Standard drinks or equivalent per week   Drug use: No    ROS: Constitutional:  Negative for fever, chills, weight loss CV: Negative for chest pain, previous MI, hypertension Respiratory:  Negative for shortness of breath, wheezing, sleep apnea, frequent cough GI:  Negative for nausea, vomiting, bloody stool, GERD  Physical exam: BP (!) 145/87   Pulse 88   Ht 6' (1.829 m)   Wt 181 lb (82.1 kg)   BMI 24.55 kg/m  GENERAL APPEARANCE:  Well appearing, well developed, well nourished, NAD HEENT:  Atraumatic, normocephalic, oropharynx clear NECK:  Supple without lymphadenopathy or thyromegaly ABDOMEN:  Soft, non-tender, no masses EXTREMITIES:  Moves all extremities well, without clubbing, cyanosis, or edema NEUROLOGIC:  Alert and oriented x 3, normal gait, CN II-XII grossly intact MENTAL STATUS:  appropriate BACK:  Non-tender to palpation, No CVAT SKIN:  Warm, dry, and intact   Results: U/A: 0-5 WBCs, 0-2 RBCs  Procedure:  Flexible Cystourethroscopy  Pre-operative Diagnosis: Abnormal CT of bladder  Post-operative Diagnosis: Abnormal CT of bladder  Anesthesia:  local with lidocaine   jelly  Surgical Narrative:  After appropriate informed consent was obtained, the patient was prepped and draped in the usual sterile fashion in the supine position.  The patient was correctly identified and the proper procedure delineated prior to proceeding.  Sterile lidocaine  gel was instilled in the urethra. The flexible cystoscope was introduced without difficulty.  Findings:  Anterior urethra: Normal  Posterior urethra: Lateral lobe hypertrophy  Bladder: Trabeculations, cellules, no mucosal abnormalities  Ureteral orifices: normal  Additional findings: None  Saline bladder wash for cytology was not performed.    The cystoscope was then removed.  The patient tolerated the procedure well.

## 2023-09-29 ENCOUNTER — Inpatient Hospital Stay

## 2023-09-29 DIAGNOSIS — C7800 Secondary malignant neoplasm of unspecified lung: Secondary | ICD-10-CM | POA: Insufficient documentation

## 2023-09-29 DIAGNOSIS — E291 Testicular hypofunction: Secondary | ICD-10-CM | POA: Insufficient documentation

## 2023-09-29 DIAGNOSIS — C7951 Secondary malignant neoplasm of bone: Secondary | ICD-10-CM | POA: Insufficient documentation

## 2023-09-29 DIAGNOSIS — C61 Malignant neoplasm of prostate: Secondary | ICD-10-CM | POA: Insufficient documentation

## 2023-09-29 DIAGNOSIS — Z8546 Personal history of malignant neoplasm of prostate: Secondary | ICD-10-CM

## 2023-09-29 LAB — CMP (CANCER CENTER ONLY)
ALT: 10 U/L (ref 0–44)
AST: 17 U/L (ref 15–41)
Albumin: 3.8 g/dL (ref 3.5–5.0)
Alkaline Phosphatase: 73 U/L (ref 38–126)
Anion gap: 6 (ref 5–15)
BUN: 20 mg/dL (ref 8–23)
CO2: 29 mmol/L (ref 22–32)
Calcium: 9.6 mg/dL (ref 8.9–10.3)
Chloride: 104 mmol/L (ref 98–111)
Creatinine: 0.98 mg/dL (ref 0.61–1.24)
GFR, Estimated: >60 mL/min (ref >60)
Glucose, Bld: 93 mg/dL (ref 70–99)
Potassium: 4.2 mmol/L (ref 3.5–5.1)
Sodium: 139 mmol/L (ref 135–145)
Total Bilirubin: 0.4 mg/dL (ref 0.0–1.2)
Total Protein: 6.5 g/dL (ref 6.5–8.1)

## 2023-09-29 LAB — CBC WITH DIFFERENTIAL (CANCER CENTER ONLY)
Abs Immature Granulocytes: 0 K/uL (ref 0.00–0.07)
Basophils Absolute: 0 K/uL (ref 0.0–0.1)
Basophils Relative: 1 %
Eosinophils Absolute: 0.2 K/uL (ref 0.0–0.5)
Eosinophils Relative: 5 %
HCT: 35.7 % — ABNORMAL LOW (ref 39.0–52.0)
Hemoglobin: 12.1 g/dL — ABNORMAL LOW (ref 13.0–17.0)
Immature Granulocytes: 0 %
Lymphocytes Relative: 23 %
Lymphs Abs: 0.7 K/uL (ref 0.7–4.0)
MCH: 32.1 pg (ref 26.0–34.0)
MCHC: 33.9 g/dL (ref 30.0–36.0)
MCV: 94.7 fL (ref 80.0–100.0)
Monocytes Absolute: 0.3 K/uL (ref 0.1–1.0)
Monocytes Relative: 9 %
Neutro Abs: 1.8 K/uL (ref 1.7–7.7)
Neutrophils Relative %: 62 %
Platelet Count: 215 K/uL (ref 150–400)
RBC: 3.77 MIL/uL — ABNORMAL LOW (ref 4.22–5.81)
RDW: 14 % (ref 11.5–15.5)
WBC Count: 3 K/uL — ABNORMAL LOW (ref 4.0–10.5)
nRBC: 0 % (ref 0.0–0.2)

## 2023-09-29 LAB — LACTATE DEHYDROGENASE: LDH: 126 U/L (ref 98–192)

## 2023-09-30 LAB — TESTOSTERONE: Testosterone: 6 ng/dL — ABNORMAL LOW (ref 264–916)

## 2023-09-30 LAB — PROSTATE-SPECIFIC AG, SERUM (LABCORP): Prostate Specific Ag, Serum: <0.1 ng/mL (ref 0.0–4.0)

## 2023-09-30 NOTE — Assessment & Plan Note (Signed)
 Monitor for febrile neutropenia with symptoms and lab baseline bone mineral density study and then every 2 years calcium (1000-1200 mg daily from food and supplements) and vitamin D3 (1000 IU daily) Control and prevent diabetes Aggressive cardiovascular risk management Weight-bearing exercises (30 minutes per day) Limit alcohol consumption and avoid smoking

## 2023-09-30 NOTE — Progress Notes (Unsigned)
 [facility] OFFICE PROGRESS NOTE  Patient Care Team: Copland, Harlene BROCKS, MD as PCP - General (Family Medicine) Vertell Pont, RN as Oncology Nurse Navigator Manny, Ricardo KATHEE Raddle., MD as Consulting Physician (Urology) Patrcia Cough, MD as Consulting Physician (Radiation Oncology) Starla Wendelyn BIRCH, RN as Registered Nurse Crawford, Morna Pickle, NP as Nurse Practitioner (Hematology and Oncology) Tina Pauletta BROCKS, MD as Consulting Physician (Oncology) Roseann, Adine PARAS., MD as Referring Physician (Urology)  Johnny Davenport is a 73 y.o.male with history of BPH, arthritis, hyperlipidemia, prostate cancer [facility].  Currently on active treatment with docetaxel  triplet therapy.   Current diagnosis: M1 HSPC with bone, lung, lymph node metastases. Biposy on R iliac lesion showed poorly differentiated prostate adenocarcinoma Initial diagnosis: Stage T2 adenocarcinoma of the prostate with Gleason score of 3+4, GG2 and PSA of 7.3.  Germline testing: negative. Somatic testing: negative for MMR/MSI high. PD-L1 TPS 1%.  Treatment: s/p brachytherapy in 08/2022  Current treatment: Triple therapy with docetaxel , Relugolix  and darolutamide .   He gets monthly shipment of daro and relugolix  to his house.   Now completed C4. PSA <0.1.    Will continue ADT/ARPI. Follow up next month.  PET on 8/25. Lab on 8/27 and see me on 8/28. Assessment & Plan Prostate cancer metastatic to multiple sites [facility]) Post Cycle 4 of docetaxel  with excellent response. PSA <0.1 We will follow-up PSA next month Plan for PET in end of Aug. Ordered Continue Relugolix  and darolutamide  Overactive bladder Started vesicare. At risk for side effect of medication Monitor for febrile neutropenia with symptoms and lab baseline bone mineral density study and then every 2 years calcium  (1000-1200 mg daily from food and supplements) and vitamin D3 (1000 IU daily) Control  and prevent diabetes Aggressive cardiovascular risk management Weight-bearing exercises (30 minutes per day) Limit alcohol consumption and avoid smoking  Orders Placed This Encounter  Procedures   NM PET (PSMA) SKULL TO MID THIGH    Standing Status:   Future    Expected Date:   11/04/2023    Expiration Date:   10/01/2024    If indicated for the ordered procedure, I authorize the administration of a radiopharmaceutical per Radiology protocol:   Yes    Preferred imaging location?:   [facility]   CBC with Differential (Cancer Center Only)    Standing Status:   Future    Expiration Date:   10/01/2024   CMP (Cancer Center only)    Standing Status:   Future    Expiration Date:   10/01/2024   Prostate-Specific AG, Serum    Standing Status:   Future    Expiration Date:   10/01/2024   Testosterone     Standing Status:   Future    Expiration Date:   10/01/2024     [name], MD  INTERVAL HISTORY: Patient returns for follow-up. Energy is better. No decreased appetite. No chest pain, short of breath, new bone or back pain. He is walking 3 miles a day. No trouble urinating and able to urinate better. Report cysto was fine last week. No stomach pain, n/v.d.  A pull muscle getting muscle.   Oncology History  History of prostate cancer  05/06/2022 Cancer Staging   Staging form: Prostate, AJCC 8th Edition - Clinical stage from 05/06/2022: Stage IIB (cT2, cN0, cM0, PSA: 7.3, Grade Group: 2) - Signed by Sherwood Rise, PA-C on 07/04/2022 Histopathologic type: Adenocarcinoma, NOS Stage prefix: Initial diagnosis Prostate specific antigen (  PSA) range: Less than 10 Gleason primary pattern: 3 Gleason secondary pattern: 4 Gleason score: 7 Histologic grading system: 5 grade system Number of biopsy cores examined: 12 Number of biopsy cores positive: 4 Location of positive needle core biopsies: One side   05/28/2022 Initial Diagnosis   Malignant neoplasm of prostate   03/27/2023 Imaging   MRI L  Hip Heterogeneous osseous metastasis involving the pelvis involving the L4 and L5 vertebral bodies Probable extraosseous extension and small pathologic fracture of left ischial tuberosity with cortical disruption laterally.  Moderate sized partial-thickness tear of the adjacent left hamstring tendon origin Bone marrow edema surrounding the left SI joint with mild edema type signal along the superior margin of the left piriformis muscle.  Finding could be secondary to extraosseous extension of metastasis or represent posttraumatic change Small partial-thickness tear in the left gluteus muscle minimus tendon at the insertion on the greater trochanter with associated reactive bone marrow edema in the superior greater trochanter. Small low signal intensity focus along the insertional fibers of the left gluteus tendons which could represent calcium  hydroxyapatite deposit Mild osteoarthritis of both. Possible enlarged left internal iliac/perirectal lymph node measuring 1.8 cm   04/08/2023 PET scan   PSMA PET 1. Extensive radiotracer avid skeletal metastasis involving the axillary and appendicular skeleton. 2. Extensive radiotracer avid lymph node metastasis in the pelvis, retroperitoneum, and mediastinum. 3. Multiple radiotracer avid small pulmonary metastasis.   04/10/2023 Tumor Marker   PSA 107   05/14/2023 -  Chemotherapy   Patient is on Treatment Plan : PROSTATE Docetaxel  (73) q21d      Genetic Testing   Negative CancerNext-Expanded +RNAinsight panel. The CancerNext-Expanded gene panel offered by [facility] and includes sequencing, rearrangement, and RNA analysis for the following 76 genes: AIP, ALK, APC, ATM, AXIN2, BAP1, BARD1, BMPR1A, BRCA1, BRCA2, BRIP1, CDC73, CDH1, CDK4, CDKN1B, CDKN2A, CEBPA, CHEK2, CTNNA1, DDX41, DICER1, ETV6, FH, FLCN, GATA2, LZTR1, MAX, MBD4, MEN1, MET, MLH1, MSH2, MSH3, MSH6, MUTYH, NF1, NF2, NTHL1, PALB2, PHOX2B, PMS2, POT1, PRKAR1A, PTCH1, PTEN, RAD51C, RAD51D,  RB1, RET, RUNX1, SDHA, SDHAF2, SDHB, SDHC, SDHD, SMAD4, SMARCA4, SMARCB1, SMARCE1, STK11, SUFU, TMEM127, TP53, TSC1, TSC2, VHL, and WT1 (sequencing and deletion/duplication); EGFR, HOXB13, KIT, MITF, PDGFRA, POLD1, and POLE (sequencing only); EPCAM and GREM1 (deletion/duplication only). Report date 06/28/23.    Prostate cancer metastatic to multiple sites [facility])  04/08/2023 PET scan   PSMA PET 1. Extensive radiotracer avid skeletal metastasis involving the axillary and appendicular skeleton. 2. Extensive radiotracer avid lymph node metastasis in the pelvis, retroperitoneum, and mediastinum. 3. Multiple radiotracer avid small pulmonary metastasis.   04/09/2023 Initial Diagnosis   Prostate cancer metastatic to multiple sites [facility])   04/10/2023 Tumor Marker   PSA 107   04/10/2023 Cancer Staging   Staging form: Prostate, AJCC 8th Edition - Clinical stage from 04/10/2023: Stage IVB (rcT0, cN1, cM1c, PSA: 107, Grade Group: 2) - Signed by Patrcia Cough, MD on 04/11/2023 Stage prefix: Recurrence Prostate specific antigen (PSA) range: 20 or greater Gleason score: 7 Histologic grading system: 5 grade system      PHYSICAL EXAMINATION: ECOG PERFORMANCE STATUS: 0 - Asymptomatic  Vitals:   10/02/23 0934  BP: (!) 131/91  Pulse: 79  Resp: 16  Temp: 97.6 F (36.4 C)  SpO2: 99%   Filed Weights   10/02/23 0934  Weight: 183 lb 7 oz (83.2 kg)    GENERAL: alert, no distress and comfortable SKIN: skin color normal and no bruising or petechiae or jaundice on exposed skin EYES: normal,  sclera clear OROPHARYNX: no exudate  LUNGS: clear to auscultation and percussion with normal breathing effort HEART: regular rate & rhythm  ABDOMEN: abdomen soft, non-tender and nondistended. Musculoskeletal: no edema NEURO: no focal motor/sensory deficits  Relevant data reviewed during this visit included labs. Lab and imaging ordered.

## 2023-09-30 NOTE — Assessment & Plan Note (Signed)
 Post Cycle 4 of docetaxel  with excellent response. PSA <0.1 We will follow-up PSA next month Plan for PET in end of Aug. Ordered Continue Relugolix  and darolutamide 

## 2023-09-30 NOTE — Assessment & Plan Note (Signed)
 Started vesicare.

## 2023-10-02 ENCOUNTER — Inpatient Hospital Stay

## 2023-10-02 VITALS — BP 131/91 | HR 79 | Temp 97.6°F | Resp 16 | Ht 72.0 in | Wt 183.4 lb

## 2023-10-02 DIAGNOSIS — C7951 Secondary malignant neoplasm of bone: Secondary | ICD-10-CM | POA: Diagnosis not present

## 2023-10-02 DIAGNOSIS — N3281 Overactive bladder: Secondary | ICD-10-CM | POA: Diagnosis not present

## 2023-10-02 DIAGNOSIS — C7800 Secondary malignant neoplasm of unspecified lung: Secondary | ICD-10-CM | POA: Diagnosis not present

## 2023-10-02 DIAGNOSIS — C61 Malignant neoplasm of prostate: Secondary | ICD-10-CM

## 2023-10-02 DIAGNOSIS — Z9189 Other specified personal risk factors, not elsewhere classified: Secondary | ICD-10-CM

## 2023-10-06 NOTE — Addendum Note (Signed)
 Addended by: WATT RAISIN C on: 10/06/2023 12:23 PM   Modules accepted: Orders

## 2023-10-07 ENCOUNTER — Telehealth: Payer: Self-pay | Admitting: *Deleted

## 2023-10-07 NOTE — Telephone Encounter (Signed)
 Notified of message below

## 2023-10-07 NOTE — Telephone Encounter (Signed)
-----   Message from Nurse Porsche C sent at 10/07/2023  8:22 AM EDT ----- Johnny Davenport, can you call pt and let him know that he can have his colonoscopy? Thx ----- Message ----- From: Tina Pauletta BROCKS, MD Sent: 10/06/2023   5:13 PM EDT To: Rudean LITTIE Franks, LPN  Yes he can get  a colonoscopy We will have a follow up after PET ----- Message ----- From: Franks Rudean LITTIE, LPN Sent: 2/71/7974  12:38 PM EDT To: [name], MD  Pt wants to know if he's clear to get his colonoscopy?  He wants us  to keep in mind that he as a PET coming up in August. Please advise

## 2023-10-09 ENCOUNTER — Encounter: Payer: Self-pay | Admitting: Gastroenterology

## 2023-10-13 ENCOUNTER — Telehealth: Payer: Self-pay

## 2023-10-13 NOTE — Telephone Encounter (Signed)
 Called patient back to answer questions about his upcoming PET scan. He did not answer so I advised him to call back for any questions. Andrea CHRISTELLA Plunk, RN

## 2023-10-13 NOTE — Telephone Encounter (Signed)
 Patient called back but had no questions as he saw his scan was scheduled. Andrea CHRISTELLA Plunk, RN

## 2023-10-16 ENCOUNTER — Other Ambulatory Visit: Payer: Self-pay | Admitting: Pharmacy Technician

## 2023-10-16 ENCOUNTER — Encounter (INDEPENDENT_AMBULATORY_CARE_PROVIDER_SITE_OTHER): Payer: Self-pay

## 2023-10-16 ENCOUNTER — Other Ambulatory Visit: Payer: Self-pay

## 2023-10-16 DIAGNOSIS — M25551 Pain in right hip: Secondary | ICD-10-CM | POA: Diagnosis not present

## 2023-10-16 DIAGNOSIS — R1031 Right lower quadrant pain: Secondary | ICD-10-CM | POA: Diagnosis not present

## 2023-10-16 NOTE — Progress Notes (Signed)
 Specialty Pharmacy Refill Coordination Note  Johnny Davenport is a 73 y.o. male contacted today regarding refills of specialty medication(s) Darolutamide  (Nubeqa ); Relugolix  (ORGOVYX )   Patient requested (Patient-Rptd) Delivery   Delivery date: 10/23/23 Verified address: (Patient-Rptd) [address] [place], [place] [address]   Medication will be filled on 10/22/23.

## 2023-10-20 ENCOUNTER — Encounter: Payer: Self-pay | Admitting: Gastroenterology

## 2023-10-20 ENCOUNTER — Other Ambulatory Visit: Payer: Self-pay

## 2023-10-20 ENCOUNTER — Ambulatory Visit (AMBULATORY_SURGERY_CENTER): Admitting: *Deleted

## 2023-10-20 VITALS — Ht 72.0 in | Wt 182.0 lb

## 2023-10-20 DIAGNOSIS — Z860100 Personal history of colon polyps, unspecified: Secondary | ICD-10-CM

## 2023-10-20 MED ORDER — NA SULFATE-K SULFATE-MG SULF 17.5-3.13-1.6 GM/177ML PO SOLN: 1.0000 | Freq: Once | ORAL | 0 refills | Status: AC

## 2023-10-20 NOTE — Progress Notes (Signed)
 Pre visit completed over telephone. Instructions mailed to home address  and sent through MyChart    No egg or soy allergy known to patient  No issues known to pt with past sedation with any surgeries or procedures Patient denies ever being told they had issues or difficulty with intubation  No FH of Malignant Hyperthermia Pt is not on diet pills Pt is not on  home 02  Pt is not on blood thinners  Pt denies issues with constipation  No A fib or A flutter Have any cardiac testing pending-- NO Pt instructed to use [email or web address].[email or web address] or GoodRx for a price reduction on prep    Chart reviewed by DOROTHA Schillings, CRNA

## 2023-10-20 NOTE — Progress Notes (Signed)
 Patient requested to switch to pickup on 8.13

## 2023-10-21 ENCOUNTER — Encounter: Payer: Self-pay | Admitting: Family Medicine

## 2023-10-21 ENCOUNTER — Other Ambulatory Visit (HOSPITAL_COMMUNITY): Payer: Self-pay

## 2023-10-21 ENCOUNTER — Ambulatory Visit (HOSPITAL_BASED_OUTPATIENT_CLINIC_OR_DEPARTMENT_OTHER)
Admission: RE | Admit: 2023-10-21 | Discharge: 2023-10-21 | Disposition: A | Discharge status: Home / Self Care | Source: Ambulatory Visit | Attending: Family Medicine | Admitting: Family Medicine

## 2023-10-21 DIAGNOSIS — C7951 Secondary malignant neoplasm of bone: Secondary | ICD-10-CM | POA: Diagnosis not present

## 2023-10-21 DIAGNOSIS — Z13820 Encounter for screening for osteoporosis: Secondary | ICD-10-CM | POA: Insufficient documentation

## 2023-10-21 DIAGNOSIS — M8589 Other specified disorders of bone density and structure, multiple sites: Secondary | ICD-10-CM | POA: Diagnosis not present

## 2023-10-21 DIAGNOSIS — M85852 Other specified disorders of bone density and structure, left thigh: Secondary | ICD-10-CM | POA: Diagnosis not present

## 2023-10-21 DIAGNOSIS — M85851 Other specified disorders of bone density and structure, right thigh: Secondary | ICD-10-CM | POA: Diagnosis not present

## 2023-10-21 DIAGNOSIS — C61 Malignant neoplasm of prostate: Secondary | ICD-10-CM | POA: Diagnosis present

## 2023-10-23 DIAGNOSIS — L813 Cafe au lait spots: Secondary | ICD-10-CM | POA: Diagnosis not present

## 2023-10-23 DIAGNOSIS — D225 Melanocytic nevi of trunk: Secondary | ICD-10-CM | POA: Diagnosis not present

## 2023-10-23 DIAGNOSIS — L570 Actinic keratosis: Secondary | ICD-10-CM | POA: Diagnosis not present

## 2023-10-23 DIAGNOSIS — D2262 Melanocytic nevi of left upper limb, including shoulder: Secondary | ICD-10-CM | POA: Diagnosis not present

## 2023-10-23 DIAGNOSIS — D2272 Melanocytic nevi of left lower limb, including hip: Secondary | ICD-10-CM | POA: Diagnosis not present

## 2023-10-23 DIAGNOSIS — L720 Epidermal cyst: Secondary | ICD-10-CM | POA: Diagnosis not present

## 2023-10-23 DIAGNOSIS — L821 Other seborrheic keratosis: Secondary | ICD-10-CM | POA: Diagnosis not present

## 2023-10-23 DIAGNOSIS — L814 Other melanin hyperpigmentation: Secondary | ICD-10-CM | POA: Diagnosis not present

## 2023-10-28 DIAGNOSIS — M25551 Pain in right hip: Secondary | ICD-10-CM | POA: Diagnosis not present

## 2023-10-28 DIAGNOSIS — R1031 Right lower quadrant pain: Secondary | ICD-10-CM | POA: Diagnosis not present

## 2023-11-04 ENCOUNTER — Encounter (HOSPITAL_COMMUNITY)
Admission: RE | Admit: 2023-11-04 | Discharge: 2023-11-04 | Disposition: A | Discharge status: Home / Self Care | Source: Ambulatory Visit

## 2023-11-04 DIAGNOSIS — R1031 Right lower quadrant pain: Secondary | ICD-10-CM | POA: Diagnosis not present

## 2023-11-04 DIAGNOSIS — C61 Malignant neoplasm of prostate: Secondary | ICD-10-CM | POA: Insufficient documentation

## 2023-11-04 DIAGNOSIS — M25551 Pain in right hip: Secondary | ICD-10-CM | POA: Diagnosis not present

## 2023-11-04 MED ORDER — FLOTUFOLASTAT F 18 GALLIUM 296-5846 MBQ/ML IV SOLN
8.0000 | Freq: Once | INTRAVENOUS | Status: AC
  Administered 2023-11-04: 8.662 via INTRAVENOUS

## 2023-11-05 ENCOUNTER — Inpatient Hospital Stay

## 2023-11-05 DIAGNOSIS — C61 Malignant neoplasm of prostate: Secondary | ICD-10-CM | POA: Insufficient documentation

## 2023-11-05 DIAGNOSIS — C7951 Secondary malignant neoplasm of bone: Secondary | ICD-10-CM | POA: Insufficient documentation

## 2023-11-05 DIAGNOSIS — E291 Testicular hypofunction: Secondary | ICD-10-CM | POA: Diagnosis not present

## 2023-11-05 DIAGNOSIS — D72810 Lymphocytopenia: Secondary | ICD-10-CM | POA: Insufficient documentation

## 2023-11-05 DIAGNOSIS — C7800 Secondary malignant neoplasm of unspecified lung: Secondary | ICD-10-CM | POA: Diagnosis not present

## 2023-11-05 DIAGNOSIS — Z8546 Personal history of malignant neoplasm of prostate: Secondary | ICD-10-CM

## 2023-11-05 DIAGNOSIS — C779 Secondary and unspecified malignant neoplasm of lymph node, unspecified: Secondary | ICD-10-CM | POA: Insufficient documentation

## 2023-11-05 LAB — CMP (CANCER CENTER ONLY)
ALT: 14 U/L (ref 0–44)
AST: 18 U/L (ref 15–41)
Albumin: 3.9 g/dL (ref 3.5–5.0)
Alkaline Phosphatase: 84 U/L (ref 38–126)
Anion gap: 4 — ABNORMAL LOW (ref 5–15)
BUN: 14 mg/dL (ref 8–23)
CO2: 31 mmol/L (ref 22–32)
Calcium: 9.4 mg/dL (ref 8.9–10.3)
Chloride: 105 mmol/L (ref 98–111)
Creatinine: 0.98 mg/dL (ref 0.61–1.24)
GFR, Estimated: >60 mL/min (ref >60)
Glucose, Bld: 86 mg/dL (ref 70–99)
Potassium: 4.3 mmol/L (ref 3.5–5.1)
Sodium: 140 mmol/L (ref 135–145)
Total Bilirubin: 0.4 mg/dL (ref 0.0–1.2)
Total Protein: 6.3 g/dL — ABNORMAL LOW (ref 6.5–8.1)

## 2023-11-05 LAB — CBC WITH DIFFERENTIAL (CANCER CENTER ONLY)
Abs Immature Granulocytes: 0 K/uL (ref 0.00–0.07)
Basophils Absolute: 0 K/uL (ref 0.0–0.1)
Basophils Relative: 1 %
Eosinophils Absolute: 0.2 K/uL (ref 0.0–0.5)
Eosinophils Relative: 6 %
HCT: 37.9 % — ABNORMAL LOW (ref 39.0–52.0)
Hemoglobin: 12.6 g/dL — ABNORMAL LOW (ref 13.0–17.0)
Immature Granulocytes: 0 %
Lymphocytes Relative: 16 %
Lymphs Abs: 0.6 K/uL — ABNORMAL LOW (ref 0.7–4.0)
MCH: 31.2 pg (ref 26.0–34.0)
MCHC: 33.2 g/dL (ref 30.0–36.0)
MCV: 93.8 fL (ref 80.0–100.0)
Monocytes Absolute: 0.3 K/uL (ref 0.1–1.0)
Monocytes Relative: 9 %
Neutro Abs: 2.4 K/uL (ref 1.7–7.7)
Neutrophils Relative %: 68 %
Platelet Count: 242 K/uL (ref 150–400)
RBC: 4.04 MIL/uL — ABNORMAL LOW (ref 4.22–5.81)
RDW: 13 % (ref 11.5–15.5)
WBC Count: 3.6 K/uL — ABNORMAL LOW (ref 4.0–10.5)
nRBC: 0 % (ref 0.0–0.2)

## 2023-11-06 DIAGNOSIS — M25551 Pain in right hip: Secondary | ICD-10-CM | POA: Diagnosis not present

## 2023-11-06 DIAGNOSIS — R1031 Right lower quadrant pain: Secondary | ICD-10-CM | POA: Diagnosis not present

## 2023-11-06 LAB — PROSTATE-SPECIFIC AG, SERUM (LABCORP): Prostate Specific Ag, Serum: <0.1 ng/mL (ref 0.0–4.0)

## 2023-11-06 LAB — TESTOSTERONE: Testosterone: <3 ng/dL — ABNORMAL LOW (ref 264–916)

## 2023-11-06 NOTE — Progress Notes (Signed)
 STAT read requested on PSMA PET for upcoming follow up on 8/29.

## 2023-11-06 NOTE — Progress Notes (Unsigned)
 [facility] OFFICE PROGRESS NOTE  Patient Care Team: Copland, Harlene BROCKS, MD as PCP - General (Family Medicine) Vertell Pont, RN as Oncology Nurse Navigator Manny, Ricardo KATHEE Raddle., MD as Consulting Physician (Urology) Patrcia Cough, MD as Consulting Physician (Radiation Oncology) Starla Wendelyn BIRCH, RN as Registered Nurse Crawford, Morna Pickle, NP as Nurse Practitioner (Hematology and Oncology) Tina Pauletta BROCKS, MD as Consulting Physician (Oncology) Roseann, Adine PARAS., MD as Referring Physician (Urology)  Johnny Davenport is a 73 y.o.male with history of BPH, arthritis, hyperlipidemia, prostate cancer [facility] recurrent [facility] cancer.  Currently on active treatment with docetaxel  triplet therapy.   Current diagnosis: M1 HSPC with bone, lung, lymph node metastases. Biposy on R iliac lesion showed poorly differentiated prostate adenocarcinoma Initial diagnosis: Stage T2 adenocarcinoma of the prostate with Gleason score of 3+4, GG2 and PSA of 7.3.  Germline testing: negative. Somatic testing: negative for MMR/MSI high. PD-L1 TPS 1%.  Treatment: s/p brachytherapy in 08/2022  Current treatment: Triplet therapy with docetaxel , Relugolix  and darolutamide .   He gets monthly shipment of daro and relugolix  to his house.    PSA <0.1.    Will continue ADT/ARPI. Follow up next month.   Assessment & Plan   No orders of the defined types were placed in this encounter.    [name], MD  INTERVAL HISTORY: Patient returns for follow-up.  Oncology History  History of prostate cancer  05/06/2022 Cancer Staging   Staging form: Prostate, AJCC 8th Edition - Clinical stage from 05/06/2022: Stage IIB (cT2, cN0, cM0, PSA: 7.3, Grade Group: 2) - Signed by Sherwood Rise, PA-C on 07/04/2022 Histopathologic type: Adenocarcinoma, NOS Stage prefix: Initial diagnosis Prostate specific antigen (PSA) range: Less than 10 Gleason primary pattern: 3 Gleason  secondary pattern: 4 Gleason score: 7 Histologic grading system: 5 grade system Number of biopsy cores examined: 12 Number of biopsy cores positive: 4 Location of positive needle core biopsies: One side   05/28/2022 Initial Diagnosis   Malignant neoplasm of prostate   03/27/2023 Imaging   MRI L Hip Heterogeneous osseous metastasis involving the pelvis involving the L4 and L5 vertebral bodies Probable extraosseous extension and small pathologic fracture of left ischial tuberosity with cortical disruption laterally.  Moderate sized partial-thickness tear of the adjacent left hamstring tendon origin Bone marrow edema surrounding the left SI joint with mild edema type signal along the superior margin of the left piriformis muscle.  Finding could be secondary to extraosseous extension of metastasis or represent posttraumatic change Small partial-thickness tear in the left gluteus muscle minimus tendon at the insertion on the greater trochanter with associated reactive bone marrow edema in the superior greater trochanter. Small low signal intensity focus along the insertional fibers of the left gluteus tendons which could represent calcium  hydroxyapatite deposit Mild osteoarthritis of both. Possible enlarged left internal iliac/perirectal lymph node measuring 1.8 cm   04/08/2023 PET scan   PSMA PET 1. Extensive radiotracer avid skeletal metastasis involving the axillary and appendicular skeleton. 2. Extensive radiotracer avid lymph node metastasis in the pelvis, retroperitoneum, and mediastinum. 3. Multiple radiotracer avid small pulmonary metastasis.   04/10/2023 Tumor Marker   PSA 107   05/14/2023 -  Chemotherapy   Patient is on Treatment Plan : PROSTATE Docetaxel  (73) q21d      Genetic Testing   Negative CancerNext-Expanded +RNAinsight panel. The CancerNext-Expanded gene panel offered by [facility] and includes sequencing, rearrangement, and RNA analysis for the following 76 genes: AIP,  ALK, APC,  ATM, AXIN2, BAP1, BARD1, BMPR1A, BRCA1, BRCA2, BRIP1, CDC73, CDH1, CDK4, CDKN1B, CDKN2A, CEBPA, CHEK2, CTNNA1, DDX41, DICER1, ETV6, FH, FLCN, GATA2, LZTR1, MAX, MBD4, MEN1, MET, MLH1, MSH2, MSH3, MSH6, MUTYH, NF1, NF2, NTHL1, PALB2, PHOX2B, PMS2, POT1, PRKAR1A, PTCH1, PTEN, RAD51C, RAD51D, RB1, RET, RUNX1, SDHA, SDHAF2, SDHB, SDHC, SDHD, SMAD4, SMARCA4, SMARCB1, SMARCE1, STK11, SUFU, TMEM127, TP53, TSC1, TSC2, VHL, and WT1 (sequencing and deletion/duplication); EGFR, HOXB13, KIT, MITF, PDGFRA, POLD1, and POLE (sequencing only); EPCAM and GREM1 (deletion/duplication only). Report date 06/28/23.    Prostate cancer metastatic to multiple sites [facility])  04/08/2023 PET scan   PSMA PET 1. Extensive radiotracer avid skeletal metastasis involving the axillary and appendicular skeleton. 2. Extensive radiotracer avid lymph node metastasis in the pelvis, retroperitoneum, and mediastinum. 3. Multiple radiotracer avid small pulmonary metastasis.   04/09/2023 Initial Diagnosis   Prostate cancer metastatic to multiple sites [facility])   04/10/2023 Tumor Marker   PSA 107   04/10/2023 Cancer Staging   Staging form: Prostate, AJCC 8th Edition - Clinical stage from 04/10/2023: Stage IVB (rcT0, cN1, cM1c, PSA: 107, Grade Group: 2) - Signed by Patrcia Cough, MD on 04/11/2023 Stage prefix: Recurrence Prostate specific antigen (PSA) range: 20 or greater Gleason score: 7 Histologic grading system: 5 grade system      PHYSICAL EXAMINATION: ECOG PERFORMANCE STATUS: {CHL ONC ECOG PS:[id number]}  There were no vitals filed for this visit. There were no vitals filed for this visit.  GENERAL: alert, no distress and comfortable SKIN: skin color normal and no bruising or petechiae or jaundice on exposed skin EYES: normal, sclera clear OROPHARYNX: no exudate  NECK: No palpable mass LYMPH:  no palpable cervical, axillary lymphadenopathy  LUNGS: clear to auscultation and percussion with normal breathing  effort HEART: regular rate & rhythm  ABDOMEN: abdomen soft, non-tender and nondistended. Musculoskeletal: no edema NEURO: no focal motor/sensory deficits  Relevant data reviewed during this visit included ***

## 2023-11-06 NOTE — Assessment & Plan Note (Signed)
 Monitor for febrile neutropenia with symptoms and lab baseline bone mineral density study and then every 2 years calcium (1000-1200 mg daily from food and supplements) and vitamin D3 (1000 IU daily) Control and prevent diabetes Aggressive cardiovascular risk management Weight-bearing exercises (30 minutes per day) Limit alcohol consumption and avoid smoking

## 2023-11-06 NOTE — Assessment & Plan Note (Signed)
 Post Cycle 4 of docetaxel  with excellent response. PSA <0.1 We will follow-up PSA next month Continue Relugolix  and darolutamide  Follow up PET in about 3-4 months

## 2023-11-07 ENCOUNTER — Inpatient Hospital Stay (HOSPITAL_BASED_OUTPATIENT_CLINIC_OR_DEPARTMENT_OTHER)

## 2023-11-07 VITALS — BP 137/87 | HR 73 | Resp 17 | Wt 187.8 lb

## 2023-11-07 DIAGNOSIS — D72810 Lymphocytopenia: Secondary | ICD-10-CM

## 2023-11-07 DIAGNOSIS — Z9189 Other specified personal risk factors, not elsewhere classified: Secondary | ICD-10-CM | POA: Diagnosis not present

## 2023-11-07 DIAGNOSIS — C61 Malignant neoplasm of prostate: Secondary | ICD-10-CM | POA: Diagnosis not present

## 2023-11-07 DIAGNOSIS — C7800 Secondary malignant neoplasm of unspecified lung: Secondary | ICD-10-CM | POA: Diagnosis not present

## 2023-11-07 DIAGNOSIS — C7951 Secondary malignant neoplasm of bone: Secondary | ICD-10-CM | POA: Diagnosis not present

## 2023-11-07 DIAGNOSIS — C779 Secondary and unspecified malignant neoplasm of lymph node, unspecified: Secondary | ICD-10-CM | POA: Diagnosis not present

## 2023-11-07 DIAGNOSIS — D72819 Decreased white blood cell count, unspecified: Secondary | ICD-10-CM | POA: Insufficient documentation

## 2023-11-07 NOTE — Assessment & Plan Note (Addendum)
 Improving.

## 2023-11-08 ENCOUNTER — Other Ambulatory Visit (HOSPITAL_COMMUNITY): Payer: Self-pay

## 2023-11-11 ENCOUNTER — Other Ambulatory Visit: Payer: Self-pay

## 2023-11-11 ENCOUNTER — Other Ambulatory Visit: Payer: Self-pay | Admitting: Pharmacy Technician

## 2023-11-11 ENCOUNTER — Other Ambulatory Visit (HOSPITAL_COMMUNITY): Payer: Self-pay

## 2023-11-11 ENCOUNTER — Encounter: Admitting: Gastroenterology

## 2023-11-11 DIAGNOSIS — R1031 Right lower quadrant pain: Secondary | ICD-10-CM | POA: Diagnosis not present

## 2023-11-11 DIAGNOSIS — M25551 Pain in right hip: Secondary | ICD-10-CM | POA: Diagnosis not present

## 2023-11-11 NOTE — Progress Notes (Signed)
 Specialty Pharmacy Refill Coordination Note  Johnny Davenport is a 73 y.o. male contacted today regarding refills of specialty medication(s) Relugolix  (ORGOVYX ); Darolutamide  (Nubeqa )   Patient requested [facility] at [facility] [place] date: 11/13/23   Medication will be filled on 11/13/23.   Patient requesting refill a few days early [place]

## 2023-11-12 ENCOUNTER — Other Ambulatory Visit: Payer: Self-pay

## 2023-11-12 NOTE — Progress Notes (Signed)
 Specialty Pharmacy Ongoing Clinical Assessment Note  Johnny Davenport is a 73 y.o. male who is being followed by the specialty pharmacy service for RxSp Oncology   Patient's specialty medication(s) reviewed today: Darolutamide  (Nubeqa ); Relugolix  (ORGOVYX )   Missed doses in the last 4 weeks: 0   Patient/Caregiver did not have any additional questions or concerns.   Therapeutic benefit summary: Patient is achieving benefit   Adverse events/side effects summary: No adverse events/side effects   Patient's therapy is appropriate to: Continue    Goals Addressed             This Visit's Progress    Maintain healthy active lifestyle.       Maintain optimal adherence to therapy   On track    Patient is on track. Patient will maintain adherence         Follow up: 3 months  Lunetta Marina M Makaela Cando Specialty Pharmacist

## 2023-11-13 ENCOUNTER — Other Ambulatory Visit (HOSPITAL_COMMUNITY): Payer: Self-pay

## 2023-11-13 ENCOUNTER — Other Ambulatory Visit: Payer: Self-pay

## 2023-11-13 DIAGNOSIS — R1031 Right lower quadrant pain: Secondary | ICD-10-CM | POA: Diagnosis not present

## 2023-11-13 DIAGNOSIS — M25551 Pain in right hip: Secondary | ICD-10-CM | POA: Diagnosis not present

## 2023-11-20 DIAGNOSIS — R1031 Right lower quadrant pain: Secondary | ICD-10-CM | POA: Diagnosis not present

## 2023-11-20 DIAGNOSIS — M25551 Pain in right hip: Secondary | ICD-10-CM | POA: Diagnosis not present

## 2023-12-02 DIAGNOSIS — R1031 Right lower quadrant pain: Secondary | ICD-10-CM | POA: Diagnosis not present

## 2023-12-02 DIAGNOSIS — M25551 Pain in right hip: Secondary | ICD-10-CM | POA: Diagnosis not present

## 2023-12-04 ENCOUNTER — Other Ambulatory Visit (HOSPITAL_COMMUNITY): Payer: Self-pay

## 2023-12-04 DIAGNOSIS — M25551 Pain in right hip: Secondary | ICD-10-CM | POA: Diagnosis not present

## 2023-12-04 DIAGNOSIS — R1031 Right lower quadrant pain: Secondary | ICD-10-CM | POA: Diagnosis not present

## 2023-12-08 ENCOUNTER — Inpatient Hospital Stay

## 2023-12-08 ENCOUNTER — Ambulatory Visit: Payer: Self-pay

## 2023-12-08 DIAGNOSIS — C61 Malignant neoplasm of prostate: Secondary | ICD-10-CM | POA: Insufficient documentation

## 2023-12-08 DIAGNOSIS — C7800 Secondary malignant neoplasm of unspecified lung: Secondary | ICD-10-CM | POA: Insufficient documentation

## 2023-12-08 DIAGNOSIS — C7951 Secondary malignant neoplasm of bone: Secondary | ICD-10-CM | POA: Insufficient documentation

## 2023-12-08 DIAGNOSIS — C779 Secondary and unspecified malignant neoplasm of lymph node, unspecified: Secondary | ICD-10-CM | POA: Insufficient documentation

## 2023-12-08 LAB — CBC WITH DIFFERENTIAL (CANCER CENTER ONLY)
Abs Immature Granulocytes: 0.01 K/uL (ref 0.00–0.07)
Basophils Absolute: 0 K/uL (ref 0.0–0.1)
Basophils Relative: 1 %
Eosinophils Absolute: 0.2 K/uL (ref 0.0–0.5)
Eosinophils Relative: 5 %
HCT: 37.9 % — ABNORMAL LOW (ref 39.0–52.0)
Hemoglobin: 13 g/dL (ref 13.0–17.0)
Immature Granulocytes: 0 %
Lymphocytes Relative: 17 %
Lymphs Abs: 0.8 K/uL (ref 0.7–4.0)
MCH: 32 pg (ref 26.0–34.0)
MCHC: 34.3 g/dL (ref 30.0–36.0)
MCV: 93.3 fL (ref 80.0–100.0)
Monocytes Absolute: 0.4 K/uL (ref 0.1–1.0)
Monocytes Relative: 9 %
Neutro Abs: 3.4 K/uL (ref 1.7–7.7)
Neutrophils Relative %: 68 %
Platelet Count: 263 K/uL (ref 150–400)
RBC: 4.06 MIL/uL — ABNORMAL LOW (ref 4.22–5.81)
RDW: 12.7 % (ref 11.5–15.5)
WBC Count: 4.9 K/uL (ref 4.0–10.5)
nRBC: 0 % (ref 0.0–0.2)

## 2023-12-08 LAB — CMP (CANCER CENTER ONLY)
ALT: 13 U/L (ref 0–44)
AST: 16 U/L (ref 15–41)
Albumin: 4.2 g/dL (ref 3.5–5.0)
Alkaline Phosphatase: 90 U/L (ref 38–126)
Anion gap: 7 (ref 5–15)
BUN: 22 mg/dL (ref 8–23)
CO2: 28 mmol/L (ref 22–32)
Calcium: 9.6 mg/dL (ref 8.9–10.3)
Chloride: 104 mmol/L (ref 98–111)
Creatinine: 1.05 mg/dL (ref 0.61–1.24)
GFR, Estimated: >60 mL/min (ref >60)
Glucose, Bld: 89 mg/dL (ref 70–99)
Potassium: 4.4 mmol/L (ref 3.5–5.1)
Sodium: 139 mmol/L (ref 135–145)
Total Bilirubin: 0.5 mg/dL (ref 0.0–1.2)
Total Protein: 6.8 g/dL (ref 6.5–8.1)

## 2023-12-08 LAB — CEA (ACCESS): CEA (CHCC): 25.33 ng/mL — ABNORMAL HIGH (ref 0.00–5.00)

## 2023-12-08 LAB — LACTATE DEHYDROGENASE: LDH: 136 U/L (ref 98–192)

## 2023-12-09 ENCOUNTER — Other Ambulatory Visit: Payer: Self-pay

## 2023-12-09 ENCOUNTER — Telehealth: Payer: Self-pay

## 2023-12-09 DIAGNOSIS — C61 Malignant neoplasm of prostate: Secondary | ICD-10-CM

## 2023-12-09 LAB — TESTOSTERONE: Testosterone: <3 ng/dL — ABNORMAL LOW (ref 264–916)

## 2023-12-09 LAB — PROSTATE-SPECIFIC AG, SERUM (LABCORP): Prostate Specific Ag, Serum: <0.1 ng/mL (ref 0.0–4.0)

## 2023-12-09 NOTE — Progress Notes (Signed)
 Spoke to patient about elevated CEA. Concerning for malignancy, DDX including prostate cancer transformation, GI/CRC, lung. Will request FDG PET for evaluation for tumor of unknown origin.  He has colonoscopy scheduled for tomorrow. Will proceed.

## 2023-12-09 NOTE — Progress Notes (Signed)
 Patient is now scheduled for FDG Pet on Friday, 10/3.  Arrival time of 2:30, nothing but water  after 9am.  Patient aware.

## 2023-12-09 NOTE — Telephone Encounter (Signed)
 Patient called inquiring about an elevated CEA. Assured patient I will discuss with Dr Tina and one of us  will get back to him. Patient expresses anxiety and concern about this test. Arland Legions BSN RN

## 2023-12-10 ENCOUNTER — Encounter: Payer: Self-pay | Admitting: Gastroenterology

## 2023-12-10 ENCOUNTER — Ambulatory Visit: Admitting: Gastroenterology

## 2023-12-10 VITALS — BP 118/74 | HR 70 | Temp 97.9°F | Resp 16 | Ht 72.0 in | Wt 182.0 lb

## 2023-12-10 DIAGNOSIS — K5730 Diverticulosis of large intestine without perforation or abscess without bleeding: Secondary | ICD-10-CM | POA: Diagnosis not present

## 2023-12-10 DIAGNOSIS — Z860101 Personal history of adenomatous and serrated colon polyps: Secondary | ICD-10-CM

## 2023-12-10 DIAGNOSIS — K644 Residual hemorrhoidal skin tags: Secondary | ICD-10-CM | POA: Diagnosis not present

## 2023-12-10 DIAGNOSIS — K627 Radiation proctitis: Secondary | ICD-10-CM

## 2023-12-10 DIAGNOSIS — K641 Second degree hemorrhoids: Secondary | ICD-10-CM

## 2023-12-10 DIAGNOSIS — Z1211 Encounter for screening for malignant neoplasm of colon: Secondary | ICD-10-CM | POA: Diagnosis not present

## 2023-12-10 DIAGNOSIS — Z860100 Personal history of colon polyps, unspecified: Secondary | ICD-10-CM

## 2023-12-10 DIAGNOSIS — Y842 Radiological procedure and radiotherapy as the cause of abnormal reaction of the patient, or of later complication, without mention of misadventure at the time of the procedure: Secondary | ICD-10-CM | POA: Diagnosis not present

## 2023-12-10 MED ORDER — SODIUM CHLORIDE 0.9 % IV SOLN: 500.0000 mL | Freq: Once | INTRAVENOUS | Status: DC

## 2023-12-10 NOTE — Progress Notes (Signed)
 Pt's states no medical or surgical changes since previsit or office visit.

## 2023-12-10 NOTE — Op Note (Signed)
 [facility] Patient Name: Johnny Davenport Procedure Date: 12/10/2023 7:18 AM MRN: [id number] Endoscopist: Aloha Finner , MD, [id number] Age: 73 Referring MD:  Date of Birth: Dec 29, 1950 Gender: Male Account #: [id number] Procedure:                Colonoscopy Indications:              Surveillance: Personal history of adenomatous                            polyps on last colonoscopy > 3 years ago, High risk                            colon cancer surveillance: Personal history of                            sessile serrated colon polyp (less than 10 mm in                            size) with no dysplasia, High risk colon cancer                            surveillance: Personal history of sessile serrated                            colon polyp (10 mm or greater in size) Medicines:                Monitored Anesthesia Care Procedure:                Pre-Anesthesia Assessment:                           - Prior to the procedure, a History and Physical                            was performed, and patient medications and                            allergies were reviewed. The patient's tolerance of                            previous anesthesia was also reviewed. The risks                            and benefits of the procedure and the sedation                            options and risks were discussed with the patient.                            All questions were answered, and informed consent                            was obtained. Prior Anticoagulants: The patient has  taken no anticoagulant or antiplatelet agents. ASA                            Grade Assessment: II - A patient with mild systemic                            disease. After reviewing the risks and benefits,                            the patient was deemed in satisfactory condition to                            undergo the procedure.                           After obtaining informed  consent, the colonoscope                            was passed under direct vision. Throughout the                            procedure, the patient's blood pressure, pulse, and                            oxygen saturations were monitored continuously. The                            CF [id number] #[id number] was introduced through the anus                            and advanced to the 3 cm into the ileum. The                            colonoscopy was performed without difficulty. The                            patient tolerated the procedure. The quality of the                            bowel preparation was adequate. The terminal ileum,                            ileocecal valve, appendiceal orifice, and rectum                            were photographed. Scope In: 7:58:45 AM Scope Out: 8:09:53 AM Scope Withdrawal Time: 0 hours 7 minutes 58 seconds  Total Procedure Duration: 0 hours 11 minutes 8 seconds  Findings:                 The digital rectal exam findings include                            hemorrhoids. Pertinent negatives include no  palpable rectal lesions.                           The terminal ileum and ileocecal valve appeared                            normal.                           A few small-mouthed diverticula were found in the                            recto-sigmoid colon and sigmoid colon.                           Multiple small localized angioectasias with typical                            arborization were found in the distal rectum                            (consistent with Radiation Proctitis/RAVE).                           The entire examined colon appeared normal otherwise.                           Non-bleeding non-thrombosed internal hemorrhoids                            were found during perianal exam, during digital                            exam and during endoscopy. The hemorrhoids were                            Grade II  (internal hemorrhoids that prolapse but                            reduce spontaneously). Complications:            No immediate complications. Estimated blood loss:                            None. Estimated Blood Loss:     Estimated blood loss: none. Impression:               - Hemorrhoids found on digital rectal exam.                           - The examined portion of the ileum was normal.                           - Diverticulosis in the recto-sigmoid colon and in                            the sigmoid colon.                           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                           - The entire examined colon is normal otherwise.                           - Non-bleeding non-thrombosed internal hemorrhoids. Recommendation:           - The patient will be observed post-procedure,                            until all discharge criteria are met.                           - Discharge patient to home.                           - Patient has a contact number available for                            emergencies. The signs and symptoms of potential                            delayed complications were discussed with the                            patient. Return to normal activities tomorrow.                            Written discharge instructions were provided to the                            patient.                           - High fiber diet.                           - Use FiberCon 1-2 tablets PO daily.                           - Continue present medications.                           - Repeat colonoscopy in 5 years for surveillance,                            pending patient's health at the time due to history                            of previous advanced adenoma.                           - Consideration of flexible sigmoidoscopy and  treatment for RAVE vs hemorrhoidal banding in                            future, if rectal bleeding  becomes more significant.                           - The findings and recommendations were discussed                            with the patient.                           - The findings and recommendations were discussed                            with the patient's family. Aloha Finner, MD 12/10/2023 8:17:29 AM

## 2023-12-10 NOTE — Progress Notes (Signed)
 Report to PACU, RN, vss, BBS= Clear.

## 2023-12-10 NOTE — Patient Instructions (Signed)

## 2023-12-10 NOTE — Progress Notes (Signed)
 GASTROENTEROLOGY PROCEDURE H&P NOTE   Primary Care Physician: Watt Harlene BROCKS, MD  HPI: Johnny Davenport is a 73 y.o. male who presents for Colonoscopy for surveillance of previous SSPs.  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   Wears glasses    Past Surgical History:  Procedure Laterality Date   COLONOSCOPY  2021   TA/hems-5 yr recall   Hand/Finger surgery  1997   KNEE ARTHROSCOPY W/ MENISCAL REPAIR Right    x 2   POLYPECTOMY  2016   TA   PROSTATE BIOPSY     PROSTATE SURGERY  06/24   RADIOACTIVE SEED IMPLANT N/A 08/13/2022   Procedure: RADIOACTIVE SEED IMPLANT/BRACHYTHERAPY IMPLANT;  Surgeon: Rosalind Zachary NOVAK, MD;  Location: [facility];  Service: Urology;  Laterality: N/A;  90 MINS   ROTATOR CUFF REPAIR Left    SPACE OAR INSTILLATION N/A 08/13/2022   Procedure: SPACE OAR INSTILLATION;  Surgeon: Rosalind Zachary NOVAK, MD;  Location: [facility];  Service: Urology;  Laterality: N/A;   TONSILLECTOMY     WISDOM TOOTH EXTRACTION     Current Outpatient Medications  Medication Sig Dispense Refill   Calcium  Carb-Cholecalciferol (CALCIUM  + D3 PO) Take 1 tablet by mouth 2 (two) times daily with a meal.     darolutamide  (NUBEQA ) 300 MG tablet Take 2 tablets (600 mg total) by mouth 2 (two) times daily with a meal. 120 tablet 11   DULCOLAX 5 MG EC tablet Take 5 mg by mouth in the morning, at noon, and at bedtime. (Patient taking differently: Take 5 mg by mouth once.)     omeprazole  (PRILOSEC) 20 MG capsule Take 1 capsule (20 mg total) by mouth daily. 90 capsule 3    relugolix  (ORGOVYX ) 120 MG tablet Take 1 tablet (120 mg total) by mouth daily. 30 tablet 11   rosuvastatin  (CRESTOR ) 20 MG tablet Take 20 mg by mouth at bedtime.     silodosin (RAPAFLO) 8 MG CAPS capsule Take 8 mg by mouth daily with breakfast.     TYLENOL  500 MG tablet Take 500 mg by mouth every 6 (six) hours as needed (for headaches or mild pain).     SUMAtriptan  (IMITREX ) 50 MG tablet TAKE ONE TABLET BY MOUTH AT ONSET OF MIGRAINE. MAY REPEAT DOSE WITH ONE TABLET IN TWO HOURS IF NEEDED. DO NOT EXCEED TWO TABLETS IN 24 HOURS. 10 tablet 6   Current Facility-Administered Medications  Medication Dose Route Frequency Provider Last Rate Last Admin   0.9 %  sodium chloride  infusion  500 mL Intravenous Once Mansouraty, Jilberto Vanderwall Jr., MD        Current Outpatient Medications:    Calcium  Carb-Cholecalciferol (CALCIUM  + D3 PO), Take 1 tablet by mouth 2 (two) times daily with a meal., Disp: , Rfl:    darolutamide  (NUBEQA ) 300 MG tablet, Take 2 tablets (600 mg total) by mouth 2 (two) times daily with a meal., Disp: 120 tablet, Rfl: 11   DULCOLAX 5 MG EC tablet, Take 5 mg by mouth  in the morning, at noon, and at bedtime. (Patient taking differently: Take 5 mg by mouth once.), Disp: , Rfl:    omeprazole  (PRILOSEC) 20 MG capsule, Take 1 capsule (20 mg total) by mouth daily., Disp: 90 capsule, Rfl: 3   relugolix  (ORGOVYX ) 120 MG tablet, Take 1 tablet (120 mg total) by mouth daily., Disp: 30 tablet, Rfl: 11   rosuvastatin  (CRESTOR ) 20 MG tablet, Take 20 mg by mouth at bedtime., Disp: , Rfl:    silodosin (RAPAFLO) 8 MG CAPS capsule, Take 8 mg by mouth daily with breakfast., Disp: , Rfl:    TYLENOL  500 MG tablet, Take 500 mg by mouth every 6 (six) hours as needed (for headaches or mild pain)., Disp: , Rfl:    SUMAtriptan  (IMITREX ) 50 MG tablet, TAKE ONE TABLET BY MOUTH AT ONSET OF MIGRAINE. MAY REPEAT DOSE WITH ONE TABLET IN TWO HOURS IF NEEDED. DO NOT EXCEED TWO TABLETS IN 24 HOURS., Disp: 10 tablet, Rfl:  6  Current Facility-Administered Medications:    0.9 %  sodium chloride  infusion, 500 mL, Intravenous, Once, Mansouraty, Aloha Raddle., MD No Known Allergies Family History  Problem Relation Age of Onset   Stroke Mother 49   Healthy Sister    Healthy Sister    Leukemia Cousin 15   Breast cancer Cousin 11   Lymphoma Cousin    Bladder Cancer Cousin 50   Breast cancer Other    Colon cancer Neg Hx    Colon polyps Neg Hx    Esophageal cancer Neg Hx    Stomach cancer Neg Hx    Rectal cancer Neg Hx    Social History   Socioeconomic History   Marital status: Married    Spouse name: Not on file   Number of children: Not on file   Years of education: Not on file   Highest education level: Master's degree (e.g., MA, MS, MEng, MEd, MSW, MBA)  Occupational History   Not on file  Tobacco Use   Smoking status: Never   Smokeless tobacco: Never  Vaping Use   Vaping status: Never Used  Substance and Sexual Activity   Alcohol use: Not Currently    Alcohol/week: 2.0 standard drinks of alcohol    Types: 1 Glasses of wine, 1 Standard drinks or equivalent per week   Drug use: No   Sexual activity: Yes    Birth control/protection: None  Other Topics Concern   Not on file  Social History Narrative   Not on file   Social Drivers of Health   Financial Resource Strain: Low Risk  (09/09/2023)   Overall Financial Resource Strain (CARDIA)    Difficulty of Paying Living Expenses: Not hard at all  Food Insecurity: No Food Insecurity (09/09/2023)   Hunger Vital Sign    Worried About Running Out of Food in the Last Year: Never true    Ran Out of Food in the Last Year: Never true  Transportation Needs: No Transportation Needs (09/16/2023)   PRAPARE - Administrator, Civil Service (Medical): No    Lack of Transportation (Non-Medical): No  Physical Activity: Sufficiently Active (09/09/2023)   Exercise Vital Sign    Days of Exercise per Week: 6 days    Minutes of Exercise per Session: 50  min  Stress: No Stress Concern Present (09/09/2023)   [employer] of Occupational Health - Occupational Stress Questionnaire    Feeling of Stress: Only a little  Social Connections: Socially Integrated (09/09/2023)   Social Connection and  Isolation Panel    Frequency of Communication with Friends and Family: More than three times a week    Frequency of Social Gatherings with Friends and Family: Twice a week    Attends Religious Services: More than 4 times per year    Active Member of [employer] or Organizations: Yes    Attends Banker Meetings: 1 to 4 times per year    Marital Status: Married  Catering manager Violence: Not At Risk (09/16/2023)   Humiliation, Afraid, Rape, and Kick questionnaire    Fear of Current or Ex-Partner: No    Emotionally Abused: No    Physically Abused: No    Sexually Abused: No    Physical Exam: Today's Vitals   12/10/23 0720  BP: (!) 140/87  Pulse: 87  Temp: 97.9 F (36.6 C)  TempSrc: Temporal  SpO2: 98%  Weight: 182 lb (82.6 kg)  Height: 6' (1.829 m)   Body mass index is 24.68 kg/m. GEN: NAD EYE: Sclerae anicteric ENT: MMM CV: Non-tachycardic GI: Soft, NT/ND NEURO:  Alert & Oriented x 3  Lab Results: Recent Labs    12/08/23 0804  WBC 4.9  HGB 13.0  HCT 37.9*  PLT 263   BMET Recent Labs    12/08/23 0804  NA 139  K 4.4  CL 104  CO2 28  GLUCOSE 89  BUN 22  CREATININE 1.05  CALCIUM  9.6   LFT Recent Labs    12/08/23 0804  PROT 6.8  ALBUMIN 4.2  AST 16  ALT 13  ALKPHOS 90  BILITOT 0.5   PT/INR No results for input(s): LABPROT, INR in the last 72 hours.   Impression / Plan: This is a 73 y.o.male who presents for Colonoscopy for surveillance of previous SSPs.  The risks and benefits of endoscopic evaluation/treatment were discussed with the patient and/or family; these include but are not limited to the risk of perforation, infection, bleeding, missed lesions, lack of diagnosis, severe illness requiring  hospitalization, as well as anesthesia and sedation related illnesses.  The patient's history has been reviewed, patient examined, no change in status, and deemed stable for procedure.  The patient and/or family is agreeable to proceed.    Aloha Finner, MD [facility] Office # [phone]

## 2023-12-11 ENCOUNTER — Telehealth: Payer: Self-pay

## 2023-12-11 ENCOUNTER — Other Ambulatory Visit: Payer: Self-pay

## 2023-12-11 NOTE — Telephone Encounter (Signed)
  Follow up Call-     12/10/2023    7:17 AM  Call back number  Post procedure Call Back phone  # [phone]  Permission to leave phone message Yes     Patient questions:  Do you have a fever, pain , or abdominal swelling? No. Pain Score  0 *  Have you tolerated food without any problems? Yes.    Have you been able to return to your normal activities? Yes.    Do you have any questions about your discharge instructions: Diet   No. Medications  No. Follow up visit  No.  Do you have questions or concerns about your Care? No.  Actions: * If pain score is 4 or above: No action needed, pain <4.

## 2023-12-12 ENCOUNTER — Encounter (HOSPITAL_COMMUNITY)
Admission: RE | Admit: 2023-12-12 | Discharge: 2023-12-12 | Disposition: A | Discharge status: Home / Self Care | Source: Ambulatory Visit

## 2023-12-12 ENCOUNTER — Inpatient Hospital Stay

## 2023-12-12 VITALS — BP 167/96 | HR 101 | Temp 97.5°F | Resp 20 | Wt 182.9 lb

## 2023-12-12 DIAGNOSIS — C7951 Secondary malignant neoplasm of bone: Secondary | ICD-10-CM | POA: Insufficient documentation

## 2023-12-12 DIAGNOSIS — R970 Elevated carcinoembryonic antigen [CEA]: Secondary | ICD-10-CM | POA: Diagnosis not present

## 2023-12-12 DIAGNOSIS — C61 Malignant neoplasm of prostate: Secondary | ICD-10-CM | POA: Insufficient documentation

## 2023-12-12 DIAGNOSIS — C779 Secondary and unspecified malignant neoplasm of lymph node, unspecified: Secondary | ICD-10-CM | POA: Insufficient documentation

## 2023-12-12 DIAGNOSIS — C7800 Secondary malignant neoplasm of unspecified lung: Secondary | ICD-10-CM | POA: Insufficient documentation

## 2023-12-12 LAB — GLUCOSE, CAPILLARY: Glucose-Capillary: 89 mg/dL (ref 70–99)

## 2023-12-12 MED ORDER — FLUDEOXYGLUCOSE F - 18 (FDG) INJECTION
9.1000 | Freq: Once | INTRAVENOUS | Status: AC
  Administered 2023-12-12: 9.08 via INTRAVENOUS

## 2023-12-12 NOTE — Progress Notes (Signed)
 [facility] OFFICE PROGRESS NOTE  Patient Care Team: Copland, Harlene BROCKS, MD as PCP - General (Family Medicine) Vertell Pont, RN as Oncology Nurse Navigator Manny, Ricardo KATHEE Raddle., MD as Consulting Physician (Urology) Patrcia Cough, MD as Consulting Physician (Radiation Oncology) Starla Wendelyn BIRCH, RN as Registered Nurse Causey, Morna Pickle, NP as Nurse Practitioner (Hematology and Oncology) Tina Pauletta BROCKS, MD as Consulting Physician (Oncology) Roseann, Adine PARAS., MD as Referring Physician (Urology)   Johnny Davenport is a 73 y.o.male with history of BPH, arthritis, hyperlipidemia, prostate cancer [facility] cancer.  Currently on active treatment with docetaxel  triplet therapy.   Current diagnosis: M1 HSPC with bone, lung, lymph node metastases. Biposy on R iliac lesion showed poorly differentiated prostate adenocarcinoma Initial diagnosis: Stage T2 adenocarcinoma of the prostate with Gleason score of 3+4, GG2 and PSA of 7.3.  Germline testing: negative. Somatic testing: negative for MMR/MSI high. PD-L1 TPS 1%.  Treatment: s/p brachytherapy in 08/2022  Current treatment: Triplet therapy with docetaxel , Relugolix  and darolutamide .  Mild elevation of CEA.  PSA<0.1.  With poorly differentiated prostate adenocarcinoma, FDG PET was obtained.  No FDG avid malignancy.  Colonoscopy was negative for malignancy.  Patient denies any other new concerning symptoms.  Chronic GERD appears stable. Assessment & Plan Prostate cancer metastatic to multiple sites [facility]) Continue Orgovyx  and darolutamide  Repeat labs in 4 to 5 weeks Elevated CEA Colonoscopy without CRC FDG-PET without active malignancy  Orders Placed This Encounter  Procedures   CBC with Differential (Cancer Center Only)    Standing Status:   Future    Expiration Date:   12/11/2024   CMP (Cancer Center only)    Standing Status:   Future    Expiration Date:   12/11/2024    Lactate dehydrogenase    Standing Status:   Future    Expiration Date:   12/11/2024   PSA    Standing Status:   Future    Expiration Date:   12/11/2024   CEA (Access)    Standing Status:   Future    Expiration Date:   12/11/2024     [name], MD  INTERVAL HISTORY: Patient returns for follow-up.  He is with his wife.  He is feeling well.  He denies any new bone pain or back pain.  He is active and overall feeling better.  There is no abdominal pain, stool changes, bloody stool or melena.  Colonoscopy reported without colon cancer.  He has chronic GERD but he denies any worsening symptoms.  There is no nausea or vomiting.  Oncology History  History of prostate cancer  05/06/2022 Cancer Staging   Staging form: Prostate, AJCC 8th Edition - Clinical stage from 05/06/2022: Stage IIB (cT2, cN0, cM0, PSA: 7.3, Grade Group: 2) - Signed by Sherwood Rise, PA-C on 07/04/2022 Histopathologic type: Adenocarcinoma, NOS Stage prefix: Initial diagnosis Prostate specific antigen (PSA) range: Less than 10 Gleason primary pattern: 3 Gleason secondary pattern: 4 Gleason score: 7 Histologic grading system: 5 grade system Number of biopsy cores examined: 12 Number of biopsy cores positive: 4 Location of positive needle core biopsies: One side   05/28/2022 Initial Diagnosis   Malignant neoplasm of prostate   03/27/2023 Imaging   MRI L Hip Heterogeneous osseous metastasis involving the pelvis involving the L4 and L5 vertebral bodies Probable extraosseous extension and small pathologic fracture of left ischial tuberosity with cortical disruption laterally.  Moderate sized partial-thickness tear of the adjacent left hamstring tendon  origin Bone marrow edema surrounding the left SI joint with mild edema type signal along the superior margin of the left piriformis muscle.  Finding could be secondary to extraosseous extension of metastasis or represent posttraumatic change Small partial-thickness tear in  the left gluteus muscle minimus tendon at the insertion on the greater trochanter with associated reactive bone marrow edema in the superior greater trochanter. Small low signal intensity focus along the insertional fibers of the left gluteus tendons which could represent calcium  hydroxyapatite deposit Mild osteoarthritis of both. Possible enlarged left internal iliac/perirectal lymph node measuring 1.8 cm   04/08/2023 PET scan   PSMA PET 1. Extensive radiotracer avid skeletal metastasis involving the axillary and appendicular skeleton. 2. Extensive radiotracer avid lymph node metastasis in the pelvis, retroperitoneum, and mediastinum. 3. Multiple radiotracer avid small pulmonary metastasis.   04/10/2023 Tumor Marker   PSA 107   05/14/2023 -  Chemotherapy   Patient is on Treatment Plan : PROSTATE Docetaxel  (73) q21d      Genetic Testing   Negative CancerNext-Expanded +RNAinsight panel. The CancerNext-Expanded gene panel offered by [facility] and includes sequencing, rearrangement, and RNA analysis for the following 76 genes: AIP, ALK, APC, ATM, AXIN2, BAP1, BARD1, BMPR1A, BRCA1, BRCA2, BRIP1, CDC73, CDH1, CDK4, CDKN1B, CDKN2A, CEBPA, CHEK2, CTNNA1, DDX41, DICER1, ETV6, FH, FLCN, GATA2, LZTR1, MAX, MBD4, MEN1, MET, MLH1, MSH2, MSH3, MSH6, MUTYH, NF1, NF2, NTHL1, PALB2, PHOX2B, PMS2, POT1, PRKAR1A, PTCH1, PTEN, RAD51C, RAD51D, RB1, RET, RUNX1, SDHA, SDHAF2, SDHB, SDHC, SDHD, SMAD4, SMARCA4, SMARCB1, SMARCE1, STK11, SUFU, TMEM127, TP53, TSC1, TSC2, VHL, and WT1 (sequencing and deletion/duplication); EGFR, HOXB13, KIT, MITF, PDGFRA, POLD1, and POLE (sequencing only); EPCAM and GREM1 (deletion/duplication only). Report date 06/28/23.    Prostate cancer metastatic to multiple sites [facility])  04/08/2023 PET scan   PSMA PET 1. Extensive radiotracer avid skeletal metastasis involving the axillary and appendicular skeleton. 2. Extensive radiotracer avid lymph node metastasis in the  pelvis, retroperitoneum, and mediastinum. 3. Multiple radiotracer avid small pulmonary metastasis.   04/09/2023 Initial Diagnosis   Prostate cancer metastatic to multiple sites [facility])   04/10/2023 Tumor Marker   PSA 107   04/10/2023 Cancer Staging   Staging form: Prostate, AJCC 8th Edition - Clinical stage from 04/10/2023: Stage IVB (rcT0, cN1, cM1c, PSA: 107, Grade Group: 2) - Signed by Patrcia Cough, MD on 04/11/2023 Stage prefix: Recurrence Prostate specific antigen (PSA) range: 20 or greater Gleason score: 7 Histologic grading system: 5 grade system      PHYSICAL EXAMINATION: ECOG PERFORMANCE STATUS: 0 - Asymptomatic  Vitals:   12/12/23 1623  BP: (!) 167/96  Pulse: (!) 101  Resp: 20  Temp: (!) 97.5 F (36.4 C)  SpO2: 100%   Filed Weights   12/12/23 1623  Weight: 182 lb 14.4 oz (83 kg)    GENERAL: alert, no distress and comfortable SKIN: skin color normal and no jaundice  Relevant data reviewed during this visit included labs.  New labs ordered.  PET scan personally reviewed.

## 2023-12-12 NOTE — Assessment & Plan Note (Addendum)
 Continue Orgovyx  and darolutamide  Repeat labs in 4 to 5 weeks

## 2023-12-12 NOTE — Assessment & Plan Note (Addendum)
 Colonoscopy without CRC FDG-PET without active malignancy

## 2023-12-15 ENCOUNTER — Other Ambulatory Visit (HOSPITAL_COMMUNITY): Payer: Self-pay

## 2023-12-15 ENCOUNTER — Telehealth: Payer: Self-pay

## 2023-12-15 ENCOUNTER — Other Ambulatory Visit: Payer: Self-pay

## 2023-12-15 NOTE — Telephone Encounter (Signed)
 Scheduled patients next appointments. Called and left a voicemail with appointment details.

## 2023-12-15 NOTE — Progress Notes (Signed)
 Specialty Pharmacy Refill Coordination Note  MyChart Questionnaire Submission  Johnny Davenport is a 73 y.o. male contacted today regarding refills of specialty medication(s) Nubeqa  & Orgovyx .  Doses on hand: (Patient-Rptd) Approximately 14 days   Patient requested: (Patient-Rptd) Delivery   Delivery date: 12/23/23  Verified address: [address] [place] [address]  Medication will be filled on 12/22/23.

## 2023-12-16 ENCOUNTER — Telehealth: Payer: Self-pay

## 2023-12-16 NOTE — Telephone Encounter (Signed)
 Johnny Davenport called in to confirm his appointments scheduled on 11/3 and 11/6. He is aware of all appointment details.

## 2023-12-19 ENCOUNTER — Other Ambulatory Visit (HOSPITAL_COMMUNITY): Payer: Self-pay

## 2023-12-19 ENCOUNTER — Telehealth: Payer: Self-pay

## 2023-12-19 ENCOUNTER — Other Ambulatory Visit: Payer: Self-pay

## 2023-12-19 NOTE — Telephone Encounter (Signed)
 Patient noted he had a pubic ramus fracture and has been having pain on that right side, he thought it was the hip area but after reading the report it may be the source of the pain. We discussed what can cause increased pain in the area. Discussed holding off on golf except puttin for awhile. Maybe not some many stretches. Taking an NSAID consistently to reduce any inflammation. If something causes pain try not to do it. Explained it can take up to 6 weeks to heal. He will call if he has any other issues. Arland Legions BSN RN

## 2023-12-22 ENCOUNTER — Telehealth: Payer: Self-pay | Admitting: Urology

## 2023-12-22 ENCOUNTER — Other Ambulatory Visit: Payer: Self-pay

## 2023-12-22 MED ORDER — SILODOSIN 8 MG PO CAPS: 8.0000 mg | ORAL_CAPSULE | Freq: Every day | ORAL | 6 refills | Status: AC

## 2023-12-22 NOTE — Telephone Encounter (Signed)
 Patient called in today to have silodosin (RAPAFLO) 8 MG CAPS capsule medication refilled.  [facility] [address] [facility] [place]  Yes medication refill sent as requested per patient.  Has the patient been seen within the last year? yes Does the patient need an appointment?   No last apt was September 25, 2023 and he has follow up

## 2023-12-22 NOTE — Telephone Encounter (Signed)
 Refill sent.

## 2023-12-23 ENCOUNTER — Telehealth: Payer: Self-pay | Admitting: Neurology

## 2023-12-23 DIAGNOSIS — E785 Hyperlipidemia, unspecified: Secondary | ICD-10-CM

## 2023-12-23 DIAGNOSIS — G43009 Migraine without aura, not intractable, without status migrainosus: Secondary | ICD-10-CM

## 2023-12-23 NOTE — Telephone Encounter (Signed)
 Patient was noted to have a fracture on recent PET scan BONES AND SOFT TISSUE: Multiple sclerotic lesions throughout the pelvis and spine. No FDG activity associated with these sclerotic lesions. Mild metabolic activity associated with a fracture of the right superior pubic ramus on image 204/4. No metabolically active aggressive osseous lesion.   IMPRESSION: 1. No FDG-avid  metastasis. 2. No colonic malignancy identified. 3. Mild metabolic activity at right superior pubic ramus fracture. 4. Multiple sclerotic lesions in pelvis and spine without FDG upta  I reached out to patient to ask how he was feeling and if physical therapy seems to be increasing or helping his pain.  I also reached out to his oncologist.  Left message for PT with my cell phone number

## 2023-12-23 NOTE — Telephone Encounter (Signed)
 Copied from CRM [id number]. Topic: Clinical - Medical Advice >> Dec 23, 2023 11:00 AM Berneda FALCON wrote: Reason for CRM: Garrel is PT from [facility] and states the patient has a image of metabolic pubic fracture and wants to know if they should hold off on PT or continue, or how the PCP would like them to proceed.  [phone] (confidential line)

## 2024-01-12 ENCOUNTER — Inpatient Hospital Stay

## 2024-01-12 DIAGNOSIS — C7800 Secondary malignant neoplasm of unspecified lung: Secondary | ICD-10-CM | POA: Insufficient documentation

## 2024-01-12 DIAGNOSIS — C775 Secondary and unspecified malignant neoplasm of intrapelvic lymph nodes: Secondary | ICD-10-CM | POA: Diagnosis not present

## 2024-01-12 DIAGNOSIS — C61 Malignant neoplasm of prostate: Secondary | ICD-10-CM | POA: Diagnosis present

## 2024-01-12 DIAGNOSIS — M8580 Other specified disorders of bone density and structure, unspecified site: Secondary | ICD-10-CM | POA: Diagnosis not present

## 2024-01-12 DIAGNOSIS — C7951 Secondary malignant neoplasm of bone: Secondary | ICD-10-CM | POA: Insufficient documentation

## 2024-01-12 DIAGNOSIS — R970 Elevated carcinoembryonic antigen [CEA]: Secondary | ICD-10-CM

## 2024-01-12 LAB — CMP (CANCER CENTER ONLY)
ALT: 15 U/L (ref 0–44)
AST: 20 U/L (ref 15–41)
Albumin: 4.4 g/dL (ref 3.5–5.0)
Alkaline Phosphatase: 98 U/L (ref 38–126)
Anion gap: 6 (ref 5–15)
BUN: 25 mg/dL — ABNORMAL HIGH (ref 8–23)
CO2: 29 mmol/L (ref 22–32)
Calcium: 9.9 mg/dL (ref 8.9–10.3)
Chloride: 102 mmol/L (ref 98–111)
Creatinine: 1.06 mg/dL (ref 0.61–1.24)
GFR, Estimated: >60 mL/min (ref >60)
Glucose, Bld: 95 mg/dL (ref 70–99)
Potassium: 4.9 mmol/L (ref 3.5–5.1)
Sodium: 137 mmol/L (ref 135–145)
Total Bilirubin: 0.6 mg/dL (ref 0.0–1.2)
Total Protein: 7.4 g/dL (ref 6.5–8.1)

## 2024-01-12 LAB — CBC WITH DIFFERENTIAL (CANCER CENTER ONLY)
Abs Immature Granulocytes: 0.01 K/uL (ref 0.00–0.07)
Basophils Absolute: 0 K/uL (ref 0.0–0.1)
Basophils Relative: 1 %
Eosinophils Absolute: 0.2 K/uL (ref 0.0–0.5)
Eosinophils Relative: 3 %
HCT: 37.8 % — ABNORMAL LOW (ref 39.0–52.0)
Hemoglobin: 12.8 g/dL — ABNORMAL LOW (ref 13.0–17.0)
Immature Granulocytes: 0 %
Lymphocytes Relative: 20 %
Lymphs Abs: 0.9 K/uL (ref 0.7–4.0)
MCH: 32.1 pg (ref 26.0–34.0)
MCHC: 33.9 g/dL (ref 30.0–36.0)
MCV: 94.7 fL (ref 80.0–100.0)
Monocytes Absolute: 0.4 K/uL (ref 0.1–1.0)
Monocytes Relative: 8 %
Neutro Abs: 3 K/uL (ref 1.7–7.7)
Neutrophils Relative %: 68 %
Platelet Count: 265 K/uL (ref 150–400)
RBC: 3.99 MIL/uL — ABNORMAL LOW (ref 4.22–5.81)
RDW: 12.9 % (ref 11.5–15.5)
WBC Count: 4.4 K/uL (ref 4.0–10.5)
nRBC: 0 % (ref 0.0–0.2)

## 2024-01-12 LAB — CEA (ACCESS): CEA (CHCC): 22.2 ng/mL — ABNORMAL HIGH (ref 0.00–5.00)

## 2024-01-12 LAB — LACTATE DEHYDROGENASE: LDH: 143 U/L (ref 98–192)

## 2024-01-12 LAB — PSA: Prostatic Specific Antigen: <0.02 ng/mL (ref 0.00–4.00)

## 2024-01-13 ENCOUNTER — Other Ambulatory Visit (INDEPENDENT_AMBULATORY_CARE_PROVIDER_SITE_OTHER)

## 2024-01-13 DIAGNOSIS — E785 Hyperlipidemia, unspecified: Secondary | ICD-10-CM | POA: Diagnosis not present

## 2024-01-13 LAB — LIPID PANEL
Cholesterol: 175 mg/dL (ref <200)
HDL: 48 mg/dL (ref >=40)
LDL Cholesterol (Calc): 100 mg/dL — ABNORMAL HIGH
Non-HDL Cholesterol (Calc): 127 mg/dL (ref <130)
Total CHOL/HDL Ratio: 3.6 (calc) (ref <5.0)
Triglycerides: 167 mg/dL — ABNORMAL HIGH (ref <150)

## 2024-01-13 NOTE — Addendum Note (Signed)
 Addended by: WATT RAISIN C on: 01/13/2024 06:19 AM   Modules accepted: Orders

## 2024-01-13 NOTE — Progress Notes (Unsigned)
 [facility] OFFICE PROGRESS NOTE  Patient Care Team: Copland, Harlene BROCKS, MD as PCP - General (Family Medicine) Vertell Pont, RN as Oncology Nurse Navigator Manny, Ricardo KATHEE Raddle., MD as Consulting Physician (Urology) Patrcia Cough, MD as Consulting Physician (Radiation Oncology) Starla Wendelyn BIRCH, RN as Registered Nurse Causey, Morna Pickle, NP as Nurse Practitioner (Hematology and Oncology) Tina Pauletta BROCKS, MD as Consulting Physician (Oncology) Roseann, Adine PARAS., MD as Referring Physician (Urology)  Johnny Davenport is a 73 y.o.male with history of BPH, arthritis, hyperlipidemia, prostate cancer [facility] prostate cancer.  Currently on active treatment with docetaxel  triplet therapy.   Overall feeling well.  Osteopenia.  He has few dental procedures remaining to be complete.  Dental letter given today.  Discussed nonurgent use of BMA due to osteopenia in the future.  Will consider Zometa once he has completed his dental procedures. Assessment & Plan Prostate cancer metastatic to multiple sites [facility]) Continue Orgovyx  and darolutamide  Repeat labs in 4 to 5 weeks PSA undetectable At risk for side effect of medication Monitor for febrile neutropenia with symptoms and lab baseline bone mineral density showed osteopenia in 2025. R fem neck T -1.7. Discussed dental evaluation and clearance and consider BMA calcium  (1000-1200 mg daily from food and supplements) and vitamin D3 (1000 IU daily) Control and prevent diabetes Aggressive cardiovascular risk management. Heart healthy diet Weight-bearing exercises (30 minutes per day) Limit alcohol consumption and avoid smoking Elevated CEA Negative PET and colonoscopy Dyslipidemia On simvastatin  without new side effect.  Lab at 8 on 1/5 and see me at 8:30 on 1/8  Orders Placed This Encounter  Procedures   CBC with Differential (Cancer Center Only)    Standing Status:   Future    Expiration  Date:   01/14/2025   CMP (Cancer Center only)    Standing Status:   Future    Expiration Date:   01/14/2025   Lactate dehydrogenase    Standing Status:   Future    Expiration Date:   01/14/2025   PSA    Standing Status:   Future    Expiration Date:   01/14/2025   Testosterone     Standing Status:   Future    Expiration Date:   01/14/2025   CEA (Access)    Standing Status:   Future    Expiration Date:   01/14/2025     [name], MD  INTERVAL HISTORY: Johnny Davenport returns for follow-up. Overall feeling well. No new bone or back or hip pain. Appetite is good. No recorded urinating.  No melena or black stool.  No heartburn or stomach pain.  He feels that his fracture is healing pretty well.  Almost resolved.  Oncology History  History of prostate cancer  05/06/2022 Cancer Staging   Staging form: Prostate, AJCC 8th Edition - Clinical stage from 05/06/2022: Stage IIB (cT2, cN0, cM0, PSA: 7.3, Grade Group: 2) - Signed by Sherwood Rise, PA-C on 07/04/2022 Histopathologic type: Adenocarcinoma, NOS Stage prefix: Initial diagnosis Prostate specific antigen (PSA) range: Less than 10 Gleason primary pattern: 3 Gleason secondary pattern: 4 Gleason score: 7 Histologic grading system: 5 grade system Number of biopsy cores examined: 12 Number of biopsy cores positive: 4 Location of positive needle core biopsies: One side   05/28/2022 Initial Diagnosis   Malignant neoplasm of prostate   03/27/2023 Imaging   MRI L Hip Heterogeneous osseous metastasis involving the pelvis involving the L4 and L5 vertebral bodies Probable extraosseous extension and  small pathologic fracture of left ischial tuberosity with cortical disruption laterally.  Moderate sized partial-thickness tear of the adjacent left hamstring tendon origin Bone marrow edema surrounding the left SI joint with mild edema type signal along the superior margin of the left piriformis muscle.  Finding could be secondary to extraosseous extension  of metastasis or represent posttraumatic change Small partial-thickness tear in the left gluteus muscle minimus tendon at the insertion on the greater trochanter with associated reactive bone marrow edema in the superior greater trochanter. Small low signal intensity focus along the insertional fibers of the left gluteus tendons which could represent calcium  hydroxyapatite deposit Mild osteoarthritis of both. Possible enlarged left internal iliac/perirectal lymph node measuring 1.8 cm   04/08/2023 PET scan   PSMA PET 1. Extensive radiotracer avid skeletal metastasis involving the axillary and appendicular skeleton. 2. Extensive radiotracer avid lymph node metastasis in the pelvis, retroperitoneum, and mediastinum. 3. Multiple radiotracer avid small pulmonary metastasis.   04/10/2023 Tumor Marker   PSA 107   05/14/2023 -  Chemotherapy   Patient is on Treatment Plan : PROSTATE Docetaxel  (73) q21d      Genetic Testing   Negative CancerNext-Expanded +RNAinsight panel. The CancerNext-Expanded gene panel offered by [facility] and includes sequencing, rearrangement, and RNA analysis for the following 76 genes: AIP, ALK, APC, ATM, AXIN2, BAP1, BARD1, BMPR1A, BRCA1, BRCA2, BRIP1, CDC73, CDH1, CDK4, CDKN1B, CDKN2A, CEBPA, CHEK2, CTNNA1, DDX41, DICER1, ETV6, FH, FLCN, GATA2, LZTR1, MAX, MBD4, MEN1, MET, MLH1, MSH2, MSH3, MSH6, MUTYH, NF1, NF2, NTHL1, PALB2, PHOX2B, PMS2, POT1, PRKAR1A, PTCH1, PTEN, RAD51C, RAD51D, RB1, RET, RUNX1, SDHA, SDHAF2, SDHB, SDHC, SDHD, SMAD4, SMARCA4, SMARCB1, SMARCE1, STK11, SUFU, TMEM127, TP53, TSC1, TSC2, VHL, and WT1 (sequencing and deletion/duplication); EGFR, HOXB13, KIT, MITF, PDGFRA, POLD1, and POLE (sequencing only); EPCAM and GREM1 (deletion/duplication only). Report date 06/28/23.    Prostate cancer metastatic to multiple sites [facility])  04/08/2023 PET scan   PSMA PET 1. Extensive radiotracer avid skeletal metastasis involving the axillary and appendicular  skeleton. 2. Extensive radiotracer avid lymph node metastasis in the pelvis, retroperitoneum, and mediastinum. 3. Multiple radiotracer avid small pulmonary metastasis.   04/09/2023 Initial Diagnosis   Prostate cancer metastatic to multiple sites [facility])   04/10/2023 Tumor Marker   PSA 107   04/10/2023 Cancer Staging   Staging form: Prostate, AJCC 8th Edition - Clinical stage from 04/10/2023: Stage IVB (rcT0, cN1, cM1c, PSA: 107, Grade Group: 2) - Signed by Patrcia Cough, MD on 04/11/2023 Stage prefix: Recurrence Prostate specific antigen (PSA) range: 20 or greater Gleason score: 7 Histologic grading system: 5 grade system    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   Wears glasses      PHYSICAL EXAMINATION: ECOG PERFORMANCE STATUS: 0 - Asymptomatic  Vitals:   01/15/24 0908  BP: 131/78  Pulse: 85  Resp: 20  Temp: (!) 97.3 F (36.3 C)  SpO2: 97%   Filed Weights   01/15/24 0908  Weight: 190 lb 8 oz (86.4 kg)    GENERAL: alert, no distress  and comfortable SKIN: skin color normal and no jaundice  NECK: No palpable mass LYMPH:  no palpable cervical, axillary lymphadenopathy  LUNGS: clear to auscultation and no wheeze or rales with normal breathing effort HEART: regular rate & rhythm  ABDOMEN: abdomen soft, non-tender and nondistended. Musculoskeletal: no edema   Relevant data reviewed during this visit included labs.  New labs ordered.

## 2024-01-14 ENCOUNTER — Encounter (INDEPENDENT_AMBULATORY_CARE_PROVIDER_SITE_OTHER): Payer: Self-pay

## 2024-01-14 ENCOUNTER — Other Ambulatory Visit: Payer: Self-pay | Admitting: Pharmacy Technician

## 2024-01-14 ENCOUNTER — Other Ambulatory Visit: Payer: Self-pay

## 2024-01-14 NOTE — Progress Notes (Signed)
 Specialty Pharmacy Refill Coordination Note  Johnny Davenport is a 73 y.o. male contacted today regarding refills of specialty medication(s) Relugolix  (ORGOVYX ); Darolutamide  (Nubeqa )   Patient requested (Patient-Rptd) Delivery   Delivery date: 01/22/2024 Verified address: (Patient-Rptd) [address] [place], [place] [address]   Medication will be filled on: 01/21/2024

## 2024-01-15 ENCOUNTER — Inpatient Hospital Stay

## 2024-01-15 ENCOUNTER — Other Ambulatory Visit: Payer: Self-pay

## 2024-01-15 ENCOUNTER — Other Ambulatory Visit (HOSPITAL_COMMUNITY): Payer: Self-pay

## 2024-01-15 VITALS — BP 131/78 | HR 85 | Temp 97.3°F | Resp 20 | Wt 190.5 lb

## 2024-01-15 DIAGNOSIS — E785 Hyperlipidemia, unspecified: Secondary | ICD-10-CM | POA: Diagnosis not present

## 2024-01-15 DIAGNOSIS — R970 Elevated carcinoembryonic antigen [CEA]: Secondary | ICD-10-CM

## 2024-01-15 DIAGNOSIS — Z9189 Other specified personal risk factors, not elsewhere classified: Secondary | ICD-10-CM

## 2024-01-15 DIAGNOSIS — C61 Malignant neoplasm of prostate: Secondary | ICD-10-CM

## 2024-01-15 NOTE — Assessment & Plan Note (Addendum)
 Monitor for febrile neutropenia with symptoms and lab baseline bone mineral density showed osteopenia in 2025. R fem neck T -1.7. Discussed dental evaluation and clearance and consider BMA calcium  (1000-1200 mg daily from food and supplements) and vitamin D3 (1000 IU daily) Control and prevent diabetes Aggressive cardiovascular risk management. Heart healthy diet Weight-bearing exercises (30 minutes per day) Limit alcohol consumption and avoid smoking

## 2024-01-15 NOTE — Assessment & Plan Note (Addendum)
 Negative PET and colonoscopy

## 2024-01-15 NOTE — Assessment & Plan Note (Addendum)
 On simvastatin  without new side effect.

## 2024-01-15 NOTE — Assessment & Plan Note (Addendum)
 Continue Orgovyx  and darolutamide  Repeat labs in 4 to 5 weeks PSA undetectable

## 2024-01-19 ENCOUNTER — Other Ambulatory Visit: Payer: Self-pay

## 2024-01-19 ENCOUNTER — Telehealth: Payer: Self-pay

## 2024-01-19 DIAGNOSIS — C61 Malignant neoplasm of prostate: Secondary | ICD-10-CM

## 2024-01-19 NOTE — Telephone Encounter (Signed)
 Patient does not feel that PT is going to work for him. He would like to be referred to an orthopedic that has some experience with cancer patients. Explained I will discuss with Dr Tina and I will call him back. Arland Legions BSN RN

## 2024-01-21 ENCOUNTER — Other Ambulatory Visit: Payer: Self-pay

## 2024-01-27 ENCOUNTER — Other Ambulatory Visit: Payer: Self-pay

## 2024-01-27 NOTE — Progress Notes (Signed)
 Specialty Pharmacy Ongoing Clinical Assessment Note  Johnny Davenport is a 73 y.o. male who is being followed by the specialty pharmacy service for RxSp Oncology   Patient's specialty medication(s) reviewed today: Darolutamide  (Nubeqa ); Relugolix  (ORGOVYX )   Missed doses in the last 4 weeks: 0   Patient/Caregiver did not have any additional questions or concerns.   Therapeutic benefit summary: Patient is achieving benefit   Adverse events/side effects summary: No adverse events/side effects   Patient's therapy is appropriate to: Continue    Goals Addressed             This Visit's Progress    Maintain optimal adherence to therapy   On track    Patient is on track. Patient will maintain adherence         Follow up: 3 months  [facility] Specialty Pharmacist

## 2024-01-28 ENCOUNTER — Telehealth: Payer: Self-pay | Admitting: *Deleted

## 2024-01-28 NOTE — Telephone Encounter (Signed)
 Johnny Davenport states he has a pulled gluteal muscle and went to a therapist yesterday. They gave him a TENS unit to use. He read that it was not advised for cancer patients. Wants to know he can use it as  it is just my butt/gluteal muscle

## 2024-01-29 NOTE — Progress Notes (Signed)
 Orthopedic New Patient Outpatient Note  Referring  : Self, Referral Reason For Visit:    Chief Complaint  Patient presents with  . Pelvis - Pain     History of Present Illness:  History of Present Illness The patient is a 73 year old male who presents for evaluation of pelvic pain.  He reports experiencing mild hip pain, specifically in the region of the ramus bone. This discomfort has been somewhat alleviated over the past 1.5 to 2 weeks following a massage therapy session targeting the piriformis muscle. He believes that additional massage sessions will further reduce his pain. He has not had any appointments with radiation oncology or his oncologist since his last visit. He underwent a 10-day course of radiation therapy, which concluded in mid-February 2025.   The patient recalls a traumatic fall in November 2024, where he landed on his right side after jumping from a height of approximately 6 feet. He is curious about the potential long-term effects of this incident. He is also interested in understanding the current status of his mild fracture, given that he has been resting it for about a month. He is considering resuming walking and other activities and is curious about the healing process. He has not had any new imaging since the discovery of the mild fracture. He has undergone a DEXA scan and is scheduled to receive a Zometa injection. He tolerates Tylenol  well but experiences stomach discomfort with Advil. He reports that his PSA levels are undetectable. His next PET or CT scan is scheduled for 2 to 3 months from now. He has an appointment with Dr. Tina in January 2026, after which a PET scan will be scheduled.  The patient does not experience any numbness or tingling in his legs. He reports that his groin pain, which was severe 6 weeks ago, has significantly improved and is now almost non-existent. He is interested in understanding the cause of the stiffness and soreness in his legs when  he stands up, which lasts for 10 to 15 seconds before he can start walking. He is also curious about the amount of walking that is safe for him, as he has been advised to rest his leg and hip to allow them to heal. He reports that he does not feel any twinge when he walks a mile, unlike before when he would feel a twinge after walking a quarter of a mile. During his chemotherapy treatment, he would walk a mile in the morning, afternoon, and evening after meals, totaling 3 miles a day, and felt great. He is considering resuming this routine. He also mentions that he used to play golf and would feel worse after walking on uneven surfaces and swinging a club, but would feel better the next day.  SOCIAL HISTORY Exercise: Walking, previously walked 3 miles a day (1 mile in the morning, 1 mile in the afternoon, and 1 mile in the evening) during chemotherapy. Currently advised to start with light exercise and gradually increase as tolerated.  He provided us  with the following treatment synopsis:          Pain Score : Pain Score  : 2   REVIEW OF SYMPTOMS  Constitutional symptoms: negative Gastrointestinal:  bloating and heartburn Genitourinary:  negative Skin:  negative Neurological:  negative Musculoskeletal:  joint pain or swelling, muscle aches, and muscle pains Psychiatric:  negative  HISTORY   Medical History[1]  Surgical History[2]  Allergies[3]  Prior to Admission medications  Medication Sig Start Date End Date Taking? Authorizing  Provider  Nubeqa  300 mg tablet Take 600 mg by mouth. 04/15/23  Yes HISTORICAL PROVIDER, CONVERSION  omeprazole  (PriLOSEC) 20 mg DR capsule Take 20 mg by mouth. 01/16/22  Yes HISTORICAL PROVIDER, CONVERSION  relugolix  120 mg tab tablet Take 120 mg by mouth. 04/15/23  Yes HISTORICAL PROVIDER, CONVERSION  rosuvastatin  (CRESTOR ) 20 mg tablet Take 20 mg by mouth Once Daily. 11/01/21  Yes HISTORICAL PROVIDER, CONVERSION  silodosin  (RAPAFLO ) 8 mg cap capsule Take 8  mg by mouth Once Daily. 10/03/21  Yes HISTORICAL PROVIDER, CONVERSION  SUMAtriptan  (IMITREX ) 50 mg tablet Take 50 mg by mouth. 10/21/21  Yes HISTORICAL PROVIDER, CONVERSION  amoxicillin -pot clavulanate (AUGMENTIN ) 250-125 mg per tablet Take 1 tablet by mouth 3 (three) times a day.  01/29/24  HISTORICAL PROVIDER, CONVERSION  clobetasoL  (TEMOVATE ) 0.05 % gel Dry canker sore with a tissue or cloth and then apply a small amount of ointment three times a day. 05/29/23 01/29/24  HISTORICAL PROVIDER, CONVERSION  dexAMETHasone  (DECADRON ) 4 mg tablet Take 2 tabs by mouth 2 times daily starting day before chemo. Then take 2 tabs daily for 2 days starting day after chemo. Take with food. 05/06/23 01/29/24  HISTORICAL PROVIDER, CONVERSION  methylPREDNISolone (MEDROL DOSEPAK) 4 mg 6 day dose pack Take As Directed On Package 06/04/23 01/29/24  Olam Vina Simpler, NP  ondansetron  (ZOFRAN ) 8 mg tablet Take 8 mg by mouth every 8 (eight) hours as needed. 05/06/23 01/29/24  HISTORICAL PROVIDER, CONVERSION    Social History[4]  Family History[5]  PHYSICAL EXAMINATION   Heart Rate:  [81] 81 Resp:  [14] 14 BP: (141-143)/(85-91) 141/91  Pain Assessment Pain Score  : 2 Pain Location: Pelvis Pain Frequency: Intermittent Clinical Progression: Not changed   Physical Exam Vitals reviewed.  Constitutional:      General: He is not in acute distress.    Appearance: Normal appearance. He is normal weight. He is not ill-appearing, toxic-appearing or diaphoretic.  Pulmonary:     Effort: Pulmonary effort is normal.  Musculoskeletal:        General: Normal range of motion.     Cervical back: Normal range of motion.  Skin:    General: Skin is warm and dry.  Neurological:     General: No focal deficit present.     Mental Status: He is alert and oriented to person, place, and time. Mental status is at baseline.  Psychiatric:        Mood and Affect: Mood normal.        Behavior: Behavior normal.        Thought Content:  Thought content normal.        Judgment: Judgment normal.      Imaging:  I, Kasi Shay, FNP-C, have personally reviewed the radiologic imaging reports below and have correlated these images with patient's physical examination findings:   Radiology Results (last 30 days)     Procedure Component Value Units Date/Time   CT Outside Images Non Result [[id number]] Resulted: 01/22/24 1742   Order Status: Completed Updated: 01/22/24 1742   Narrative:     These images were imported for review and or comparison.   PET OUTSIDE IMAGES NON RESULT [[id number]] Resulted: 01/22/24 1741   Order Status: Completed Updated: 01/22/24 1741   Narrative:     These images were imported for review and or comparison.        ASSESSMENT / PLAN   Assessment & Plan 1. Pelvic pain: Pelvic pain is likely due to lesions in the pelvis, which have caused  minor fractures. There is no evidence of new fractures on recent imaging. Pain is currently well-managed with massage therapy.   Radiation therapy appears to have been effective in reducing tumor burden, as evidenced by the lack of increased avidity in the pelvis on PET scan.   Zometa injections for bone health are scheduled to start.   Tylenol  should be taken before and after each injection to manage potential soreness and fatigue.   Light exercise, such as walking, is recommended, with gradual increases in activity levels as tolerated. Stretching exercises are advised, but avoid pushing through any discomfort.   Discontinue Advil if it causes stomach upset and take it with food if resuming use. Stay hydrated while taking Advil.   Continue surveillance with the oncologist and follow up with radiation oncology for further recommendations.   If numbness or tingling in the legs, inability to bear weight, worsening groin pain not relieved with rest or Tylenol , or if current interventions for piriformis or sciatic pain become ineffective, an in-person consultation  will be necessary.  2. Piriformis syndrome: Piriformis syndrome is contributing to the pelvic pain. Significant improvement is reported after massage therapy targeting the piriformis muscle. Continue with massage therapy and perform daily stretching exercises. More aggressive treatment options will be considered once the pelvic fracture has healed and normal activities can be resumed without experiencing any pain.  Follow-up: The patient will follow up via phone call in mid-February 2026.    Plan:  1. Bilateral pubic rami fractures, closed, initial encounter (CMD) (Primary)   No orders of the defined types were placed in this encounter.  -Routine CT CAP has been scheduled by an outside provider. I will review imaging once the scan has been completed and we will discuss surveillance and next steps based on symptoms and imaging.   Additional Considerations:  Tobacco Use: Low Risk  (12/10/2023)   [facility]   Patient History   . Smoking Tobacco Use: Never   . Smokeless Tobacco Use: Never   . Passive Exposure: Not on file    BP Readings from Last 3 Encounters:  01/29/24 (!) 141/91    PHQ/GAD Scores   No data found in the last 10 encounters.      I have personally spent 30 minutes minutes involved in face-to-face and non-face-to-face activities for this patient on the day of the visit.  Professional time spent includes the following activities, in addition to those noted in the documentation:  Preparing to see the patient (review of tests and records), Obtaining and/or reviewing separately obtained history, Performing a medically appropriate examination and/or evaluation, Counseling and/or educating the patient/family/caregiver, Documenting clinical information in the electronic health record, Independently interpreting results and communicating results to the patient/family/caregiver, and Care coordination  Treatment options were discussed including operative and  nonoperative management of the diagnosis when appropriate.   Return in about 3 months (around 04/30/2024).    Electronically Signed by: Jameson Sherre Pollen, NP, Nurse Practitioner 01/29/2024 2:17 PM        [1] No past medical history on file. [2] No past surgical history on file. [3] No Known Allergies [4] Social History Socioeconomic History  . Marital status: Married  Tobacco Use  . Smoking status: Never  . Smokeless tobacco: Never  Substance and Sexual Activity  . Alcohol use: Not Currently   Social Drivers of Health   Food Insecurity: No Food Insecurity (09/09/2023)   Received from [facility]   Food vital sign   . Within  the past 12 months, you worried that your food would run out before you got money to buy more: Never true   . Within the past 12 months, the food you bought just didn't last and you didn't have money to get more: Never true  Transportation Needs: No Transportation Needs (09/16/2023)   Received from [facility] - Transportation   . In the past 12 months, has lack of transportation kept you from medical appointments or from getting medications?: No   . In the past 12 months, has lack of transportation kept you from meetings, work, or from getting things needed for daily living?: No  Safety: Not At Risk (09/16/2023)   Received from [facility]   Safety   . Within the last year, have you been afraid of your partner or ex-partner?: No   . Within the last year, have you been humiliated or emotionally abused in other ways by your partner or ex-partner?: No   . Within the last year, have you been kicked, hit, slapped, or otherwise physically hurt by your partner or ex-partner?: No   . Within the last year, have you been raped or forced to have any kind of sexual activity by your partner or ex-partner?: No  Living Situation: Unknown (09/09/2023)   Received from [facility] Situation   . In the last 12 months, was there a time when you were not able to  pay the mortgage or rent on time?: No   . At any time in the past 12 months, were you homeless or living in a shelter (including now)?: No  [5] No family history on file.

## 2024-02-04 ENCOUNTER — Other Ambulatory Visit

## 2024-02-10 MED ORDER — SUMATRIPTAN SUCCINATE 50 MG PO TABS: ORAL_TABLET | ORAL | 6 refills | Status: AC

## 2024-02-10 NOTE — Addendum Note (Signed)
 Addended by: WATT RAISIN C on: 02/10/2024 01:33 PM   Modules accepted: Orders

## 2024-02-16 ENCOUNTER — Ambulatory Visit: Payer: Self-pay

## 2024-02-16 ENCOUNTER — Ambulatory Visit
Admission: RE | Admit: 2024-02-16 | Discharge: 2024-02-16 | Disposition: A | Discharge status: Home / Self Care | Attending: Physician Assistant

## 2024-02-16 ENCOUNTER — Other Ambulatory Visit: Payer: Self-pay

## 2024-02-16 VITALS — BP 126/78 | HR 82 | Temp 97.8°F | Resp 16 | Ht 72.0 in | Wt 185.0 lb

## 2024-02-16 DIAGNOSIS — H109 Unspecified conjunctivitis: Secondary | ICD-10-CM

## 2024-02-16 DIAGNOSIS — B9689 Other specified bacterial agents as the cause of diseases classified elsewhere: Secondary | ICD-10-CM | POA: Diagnosis not present

## 2024-02-16 MED ORDER — ERYTHROMYCIN 5 MG/GM OP OINT: TOPICAL_OINTMENT | Freq: Four times a day (QID) | OPHTHALMIC | 0 refills | Status: AC

## 2024-02-16 NOTE — ED Provider Notes (Signed)
 [facility]    CSN: [id number] Arrival date & time: 02/16/24  1017      History   Chief Complaint No chief complaint on file.   HPI Johnny Davenport is a 73 y.o. male.  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   HPI  Discussed the use of AI scribe software for clinical note transcription with the patient, who gave verbal consent to proceed.  The patient presents with symptoms of conjunctivitis.  The patient reports that his symptoms began yesterday, characterized by red and itchy eyes with discharge in the morning. No eye pain, vision changes, or periorbital swelling. No fever or chills.  He has had a head cold for about a week, which he associates with the onset of his symptoms. He reports nasal congestion, possibly due to the dry air from heating his home.  He does not regularly wear contact lenses and has not initiated any treatment for his symptoms. No known drug or substance allergies.   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   Wears glasses     Patient Active Problem List   Diagnosis Date Noted   Elevated CEA 12/12/2023   Leukopenia 11/07/2023   Ear fullness, right 09/08/2023   Bladder wall thickening 08/05/2023   AKI (acute kidney injury) 08/03/2023   Acute urinary retention 08/03/2023    Hyponatremia 08/03/2023   Aortic atherosclerosis 08/03/2023   Overactive bladder 07/15/2023   Genetic testing 07/03/2023   Anemia due to antineoplastic chemotherapy 06/24/2023   Acute cystitis 06/23/2023   Ear infection 06/03/2023   Mucositis 06/03/2023   At risk for side effect of medication 04/11/2023   Prostate cancer metastatic to multiple sites [facility]) 04/09/2023   History of prostate cancer 05/28/2022   Prediabetes 05/13/2022   Acquired trigger finger of right middle finger 11/09/2021   Aneurysm of thoracic aorta 10/21/2021   Lateral epicondylitis, left elbow 11/16/2020   Arthritis of carpometacarpal [facility]) joint of right thumb 11/16/2020   Dyslipidemia 09/30/2020    Past Surgical History:  Procedure Laterality Date   COLONOSCOPY  2021   TA/hems-5 yr recall   Hand/Finger surgery  1997   KNEE ARTHROSCOPY W/ MENISCAL REPAIR Right    x 2   POLYPECTOMY  2016   TA   PROSTATE BIOPSY     PROSTATE SURGERY  06/24   RADIOACTIVE SEED IMPLANT N/A 08/13/2022   Procedure: RADIOACTIVE SEED IMPLANT/BRACHYTHERAPY IMPLANT;  Surgeon: Rosalind Zachary NOVAK, MD;  Location: [facility];  Service: Urology;  Laterality: N/A;  90 MINS   ROTATOR CUFF REPAIR Left    SPACE OAR INSTILLATION N/A 08/13/2022   Procedure: SPACE OAR INSTILLATION;  Surgeon: Rosalind Zachary NOVAK, MD;  Location: [facility];  Service: Urology;  Laterality: N/A;   TONSILLECTOMY     WISDOM TOOTH EXTRACTION  Home Medications    Prior to Admission medications   Medication Sig Start Date End Date Taking? Authorizing Provider  erythromycin  ophthalmic ointment Place into both eyes 4 (four) times daily for 7 days. Place a 1/2 inch ribbon of ointment into the lower eyelid. 02/16/24 02/23/24 Yes Breckyn Troyer E, PA-C  Calcium  Carb-Cholecalciferol (CALCIUM  + D3 PO) Take 1 tablet by mouth 2 (two) times daily with a meal.    [provider]  darolutamide  (NUBEQA ) 300 MG tablet Take 2 tablets (600  mg total) by mouth 2 (two) times daily with a meal. 04/15/23   Tina Pauletta BROCKS, MD  DULCOLAX 5 MG EC tablet Take 5 mg by mouth in the morning, at noon, and at bedtime. Patient taking differently: Take 5 mg by mouth once.    [provider]  omeprazole  (PRILOSEC) 20 MG capsule Take 1 capsule (20 mg total) by mouth daily. 05/07/23   Copland, Harlene BROCKS, MD  relugolix  (ORGOVYX ) 120 MG tablet Take 1 tablet (120 mg total) by mouth daily. 04/15/23   Tina Pauletta BROCKS, MD  silodosin  (RAPAFLO ) 8 MG CAPS capsule Take 1 capsule (8 mg total) by mouth daily with breakfast. 12/22/23   Stoneking, Adine PARAS., MD  simvastatin  (ZOCOR ) 10 MG tablet Take 10 mg by mouth daily.    [provider]  SUMAtriptan  (IMITREX ) 50 MG tablet May repeat in 2 hours if headache persists or recurs.TAKE ONE TABLET BY MOUTH AT ONSET OF MIGRAINE. MAY REPEAT DOSE WITH ONE TABLET IN TWO HOURS IF NEEDED. DO NOT EXCEED TWO TABLETS IN 24 HOURS. 02/10/24   Copland, Harlene BROCKS, MD  TYLENOL  500 MG tablet Take 500 mg by mouth every 6 (six) hours as needed (for headaches or mild pain).    [provider]    Family History Family History  Problem Relation Age of Onset   Stroke Mother 59   Healthy Sister    Healthy Sister    Leukemia Cousin 15   Breast cancer Cousin 90   Lymphoma Cousin    Bladder Cancer Cousin 62   Breast cancer Other    Colon cancer Neg Hx    Colon polyps Neg Hx    Esophageal cancer Neg Hx    Stomach cancer Neg Hx    Rectal cancer Neg Hx     Social History Social History   Tobacco Use   Smoking status: Never   Smokeless tobacco: Never  Vaping Use   Vaping status: Never Used  Substance Use Topics   Alcohol use: Not Currently    Alcohol/week: 2.0 standard drinks of alcohol    Types: 1 Glasses of wine, 1 Standard drinks or equivalent per week   Drug use: No     Allergies   Patient has no known allergies.   Review of Systems Review of Systems  Constitutional:  Negative for chills and  fever.  Eyes:  Positive for discharge, redness and itching. Negative for photophobia, pain and visual disturbance.     Physical Exam Triage Vital Signs ED Triage Vitals  Encounter Vitals Group     BP 02/16/24 1028 126/78     Girls Systolic BP Percentile --      Girls Diastolic BP Percentile --      Boys Systolic BP Percentile --      Boys Diastolic BP Percentile --      Pulse Rate 02/16/24 1028 82     Resp 02/16/24 1028 16     Temp 02/16/24 1028 97.8 F (  36.6 C)     Temp Source 02/16/24 1028 Oral     SpO2 02/16/24 1028 96 %     Weight 02/16/24 1028 185 lb (83.9 kg)     Height 02/16/24 1028 6' (1.829 m)     Head Circumference --      Peak Flow --      Pain Score 02/16/24 1042 2     Pain Loc --      Pain Education --      Exclude from Growth Chart --    No data found.  Updated Vital Signs BP 126/78 (BP Location: Right Arm)   Pulse 82   Temp 97.8 F (36.6 C) (Oral)   Resp 16   Ht 6' (1.829 m)   Wt 185 lb (83.9 kg)   SpO2 96%   BMI 25.09 kg/m   Visual Acuity Right Eye Distance:   Left Eye Distance:   Bilateral Distance:    Right Eye Near:   Left Eye Near:    Bilateral Near:     Physical Exam Vitals reviewed.  Constitutional:      General: He is awake.     Appearance: Normal appearance. He is well-developed and well-groomed.  HENT:     Head: Normocephalic and atraumatic.  Eyes:     General: Lids are normal. Gaze aligned appropriately.        Right eye: No foreign body, discharge or hordeolum.        Left eye: No foreign body, discharge or hordeolum.     Extraocular Movements: Extraocular movements intact.     Conjunctiva/sclera:     Right eye: Right conjunctiva is injected. No chemosis, exudate or hemorrhage.    Left eye: Left conjunctiva is injected. No chemosis, exudate or hemorrhage.    Pupils: Pupils are equal, round, and reactive to light.  Pulmonary:     Effort: Pulmonary effort is normal.  Musculoskeletal:     Cervical back: Normal range of  motion.  Neurological:     Mental Status: He is alert and oriented to person, place, and time.  Psychiatric:        Attention and Perception: Attention normal.        Mood and Affect: Mood normal.        Speech: Speech normal.        Behavior: Behavior normal. Behavior is cooperative.      UC Treatments / Results  Labs (all labs ordered are listed, but only abnormal results are displayed) Labs Reviewed - No data to display  EKG   Radiology No results found.  Procedures Procedures (including critical care time)  Medications Ordered in UC Medications - No data to display  Initial Impression / Assessment and Plan / UC Course  I have reviewed the triage vital signs and the nursing notes.  Pertinent labs & imaging results that were available during my care of the patient were reviewed by me and considered in my medical decision making (see chart for details).      Final Clinical Impressions(s) / UC Diagnoses   Final diagnoses:  Bacterial conjunctivitis of both eyes   Acute conjunctivitis Onset yesterday, presenting with red eyes, itching, and morning discharge. No eye pain, vision changes, fever, chills, or swelling.  - Prescribed erythromycin  ophthalmic ointment, apply half an inch strip into the lower eyelid four times a day for one week. - Recommended sterile eye flushes or lubricating eye drops such as Systane to help flush out bacteria. - Advised use  of hot compresses with clean washcloths to alleviate symptoms. - Instructed to seek ophthalmologist consultation if severe symptoms develop, such as vision changes or severe eye pain.    Discharge Instructions      Based on your symptoms I believe that you have bacterial conjunctivitis  I have sent in a script for Erythromycin  ophthalmic ointment - please apply a 1/2 inch strip of the ointment to your eyes every 6 hours for 7 days  You can use sterile eye flushes and lubricating eye drops to assist with eye  irritation and further resolution You can also use a warm compress over the eye to assist with swelling and matting - especially in the morning  If you have used makeup or mascara on that eye I recommend discarding it as this can cause recurrent infection. Thoroughly wash any makeup brushes and avoid using makeup while recovering from the infection.  If you notice the following please return to the office: lack of improvement, eyelid swelling, increased eye irritation If you notice the following please go to the ED: eye pressure causing displacement of the eye, vision changes, increased eye pain or foreign body sensation, fever      ED Prescriptions     Medication Sig Dispense Auth. Provider   erythromycin  ophthalmic ointment Place into both eyes 4 (four) times daily for 7 days. Place a 1/2 inch ribbon of ointment into the lower eyelid. 3.5 g Reinhardt Licausi E, PA-C      PDMP not reviewed this encounter.   Ellicia Alix, Rocky BRAVO, PA-C 02/16/24 1139

## 2024-02-16 NOTE — Telephone Encounter (Signed)
 FYI Only or Action Required?: FYI only for provider: Currently in UC.  Patient was last seen in primary care on 05/29/2023 by Copland, Harlene BROCKS, MD.  Called Nurse Triage reporting Conjunctivitis.    Triage Disposition: Information or Advice Only Call  Patient/caregiver understands and will follow disposition?: Yes        Message from Johnny Davenport sent at 02/16/2024  8:34 AM EST  Reason for Triage: Patient is calling because he developed what he believes to be conjunctivitis or pink eye yesterday.   Reason for Disposition  Health information question, no triage required and triager able to answer question  Answer Assessment - Initial Assessment Questions 1. REASON FOR CALL: What is the main reason for your call? or How can I best help you?    This RN called patient back to discuss symptoms, he is being treated in UC now for his symptoms.  Protocols used: Information Only Call - No Triage-A-AH

## 2024-02-16 NOTE — Discharge Instructions (Addendum)
 Based on your symptoms I believe that you have bacterial conjunctivitis  I have sent in a script for Erythromycin  ophthalmic ointment - please apply a 1/2 inch strip of the ointment to your eyes every 6 hours for 7 days  You can use sterile eye flushes and lubricating eye drops to assist with eye irritation and further resolution You can also use a warm compress over the eye to assist with swelling and matting - especially in the morning  If you have used makeup or mascara on that eye I recommend discarding it as this can cause recurrent infection. Thoroughly wash any makeup brushes and avoid using makeup while recovering from the infection.  If you notice the following please return to the office: lack of improvement, eyelid swelling, increased eye irritation If you notice the following please go to the ED: eye pressure causing displacement of the eye, vision changes, increased eye pain or foreign body sensation, fever

## 2024-02-16 NOTE — ED Triage Notes (Signed)
 Bilateral eyes are red.  This was noticed yesterday.  Eyes itch and have discharge in the morning.  Patient has had a cough for a few day, discolored phlegm.  Denies fever .  Patient has not taken any medications for symptoms

## 2024-02-19 ENCOUNTER — Other Ambulatory Visit: Payer: Self-pay

## 2024-02-19 ENCOUNTER — Other Ambulatory Visit (HOSPITAL_COMMUNITY): Payer: Self-pay

## 2024-02-19 NOTE — Progress Notes (Signed)
 Patient called back and decided to have medication delivered on 12/19.  Will mail 12/18 for 02/27/24 delivery.

## 2024-02-19 NOTE — Progress Notes (Addendum)
 Specialty Pharmacy Refill Coordination Note  MyChart Questionnaire Submission  Johnny Davenport is a 73 y.o. male contacted today regarding refills of specialty medication(s) Nubeqa  & Orgovyx .  Doses on hand: (Patient-Rptd) 14 days   Patient requested: (Patient-Rptd) Delivery   Delivery date: 03/02/24  Verified address: [address] [place] [address]  Medication will be filled on 03/01/24

## 2024-02-26 ENCOUNTER — Other Ambulatory Visit: Payer: Self-pay

## 2024-02-27 ENCOUNTER — Other Ambulatory Visit: Payer: Self-pay

## 2024-03-15 ENCOUNTER — Inpatient Hospital Stay

## 2024-03-15 DIAGNOSIS — C61 Malignant neoplasm of prostate: Secondary | ICD-10-CM

## 2024-03-15 DIAGNOSIS — R970 Elevated carcinoembryonic antigen [CEA]: Secondary | ICD-10-CM

## 2024-03-15 LAB — CBC WITH DIFFERENTIAL (CANCER CENTER ONLY)
Abs Immature Granulocytes: 0.01 K/uL (ref 0.00–0.07)
Basophils Absolute: 0 K/uL (ref 0.0–0.1)
Basophils Relative: 1 %
Eosinophils Absolute: 0.2 K/uL (ref 0.0–0.5)
Eosinophils Relative: 4 %
HCT: 36.7 % — ABNORMAL LOW (ref 39.0–52.0)
Hemoglobin: 12.5 g/dL — ABNORMAL LOW (ref 13.0–17.0)
Immature Granulocytes: 0 %
Lymphocytes Relative: 20 %
Lymphs Abs: 0.9 K/uL (ref 0.7–4.0)
MCH: 32.6 pg (ref 26.0–34.0)
MCHC: 34.1 g/dL (ref 30.0–36.0)
MCV: 95.8 fL (ref 80.0–100.0)
Monocytes Absolute: 0.3 K/uL (ref 0.1–1.0)
Monocytes Relative: 8 %
Neutro Abs: 3.1 K/uL (ref 1.7–7.7)
Neutrophils Relative %: 67 %
Platelet Count: 239 K/uL (ref 150–400)
RBC: 3.83 MIL/uL — ABNORMAL LOW (ref 4.22–5.81)
RDW: 12.4 % (ref 11.5–15.5)
WBC Count: 4.5 K/uL (ref 4.0–10.5)
nRBC: 0 % (ref 0.0–0.2)

## 2024-03-15 LAB — CMP (CANCER CENTER ONLY)
ALT: 17 U/L (ref 0–44)
AST: 26 U/L (ref 15–41)
Albumin: 4.5 g/dL (ref 3.5–5.0)
Alkaline Phosphatase: 100 U/L (ref 38–126)
Anion gap: 9 (ref 5–15)
BUN: 18 mg/dL (ref 8–23)
CO2: 28 mmol/L (ref 22–32)
Calcium: 9.8 mg/dL (ref 8.9–10.3)
Chloride: 104 mmol/L (ref 98–111)
Creatinine: 1.02 mg/dL (ref 0.61–1.24)
GFR, Estimated: >60 mL/min
Glucose, Bld: 98 mg/dL (ref 70–99)
Potassium: 4.4 mmol/L (ref 3.5–5.1)
Sodium: 140 mmol/L (ref 135–145)
Total Bilirubin: 0.4 mg/dL (ref 0.0–1.2)
Total Protein: 7.2 g/dL (ref 6.5–8.1)

## 2024-03-15 LAB — CEA (ACCESS): CEA (CHCC): 12.28 ng/mL — ABNORMAL HIGH (ref 0.00–5.00)

## 2024-03-15 LAB — PSA: Prostatic Specific Antigen: <0.02 ng/mL (ref 0.00–4.00)

## 2024-03-15 LAB — LACTATE DEHYDROGENASE: LDH: 177 U/L (ref 105–235)

## 2024-03-16 LAB — TESTOSTERONE: Testosterone: <3 ng/dL — ABNORMAL LOW (ref 264–916)

## 2024-03-16 NOTE — Assessment & Plan Note (Addendum)
 Monitor for febrile neutropenia with symptoms and lab baseline bone mineral density showed osteopenia in 2025. R fem neck T -1.7.  Will start Zometa  next week Calcium  (1000-1200 mg daily from food and supplements) and vitamin D3 (1000 IU daily) Control and prevent diabetes Aggressive cardiovascular risk management. Heart healthy diet Weight-bearing exercises (30 minutes per day) Limit alcohol consumption and avoid smoking

## 2024-03-16 NOTE — Progress Notes (Signed)
 [facility] OFFICE PROGRESS NOTE  Patient Care Team: Copland, Harlene BROCKS, MD as PCP - General (Family Medicine) Vertell Pont, RN as Oncology Nurse Navigator Manny, Ricardo KATHEE Raddle., MD as Consulting Physician (Urology) Patrcia Cough, MD as Consulting Physician (Radiation Oncology) Starla Wendelyn BIRCH, RN as Registered Nurse Causey, Morna Pickle, NP as Nurse Practitioner (Hematology and Oncology) Tina Pauletta BROCKS, MD as Consulting Physician (Oncology) Roseann, Adine PARAS., MD as Referring Physician (Urology)  Johnny Davenport is a 74 y.o.male with history of BPH, arthritis, hyperlipidemia, prostate cancer [facility] recurrent prostate cancer. Currently on active treatment with docetaxel  triplet therapy.   Current diagnosis: M1 HSPC with bone, lung, lymph node metastases. Biposy on R iliac lesion showed poorly differentiated prostate adenocarcinoma Initial diagnosis: Stage T2 adenocarcinoma of the prostate with Gleason score of 3+4, GG2 and PSA of 7.3.  Germline testing: negative. Somatic testing: negative for MMR/MSI high. PD-L1 TPS 1%.  Treatment: s/p brachytherapy in 08/2022  Current treatment: Triplet therapy with docetaxel , Relugolix  and darolutamide . 4 cycles of docetaxel  as of 07/2023.  Osteopenia. He has completed dental procedures and no new concerns and ready to start Zometa .  Zometa  ordered.    Report of bilateral hand joint pain.  Mostly in the proximal area.  The symptoms resolved while he was on chemotherapy and now slowly recurring.  Will check for rheumatoid arthritis. Assessment & Plan Prostate cancer metastatic to multiple sites [facility]) Continue Orgovyx  and darolutamide  Repeat labs in mid March PSA undetectable At risk for side effect of medication Monitor for febrile neutropenia with symptoms and lab baseline bone mineral density showed osteopenia in 2025. R fem neck T -1.7.  Will start Zometa  next week Calcium  (1000-1200 mg daily from  food and supplements) and vitamin D3 (1000 IU daily) Control and prevent diabetes Aggressive cardiovascular risk management. Heart healthy diet Weight-bearing exercises (30 minutes per day) Limit alcohol consumption and avoid smoking Osteopenia of multiple sites Starting Zometa  next week Normocytic anemia Will add B12, ferritin and folate next week Arthralgia of both hands Rheumatoid factor and CCP with lab next week  Orders Placed This Encounter  Procedures   Basic Metabolic Panel - Cancer Center Only    Standing Status:   Future    Expiration Date:   03/18/2025   Vitamin B12    Standing Status:   Future    Expiration Date:   03/18/2025   Folate    Standing Status:   Future    Expiration Date:   03/18/2025   Ferritin    Standing Status:   Future    Expiration Date:   03/18/2025   Rheumatoid factor    Standing Status:   Future    Expiration Date:   03/18/2025   Cyclic Citrul Peptide Antibody, IGG     [name], MD  INTERVAL HISTORY: Patient returns for follow-up.  He is with his wife.  Overall feeling well.  Report he saw a massage therapist and help with his recent pain from his fracture.  He is not walking better.  No physical limitations.  Appetite is good.  No new concerning symptoms.  Oncology History  History of prostate cancer  05/06/2022 Cancer Staging   Staging form: Prostate, AJCC 8th Edition - Clinical stage from 05/06/2022: Stage IIB (cT2, cN0, cM0, PSA: 7.3, Grade Group: 2) - Signed by Sherwood Rise, PA-C on 07/04/2022 Histopathologic type: Adenocarcinoma, NOS Stage prefix: Initial diagnosis Prostate specific antigen (PSA) range: Less than 10 Gleason primary  pattern: 3 Gleason secondary pattern: 4 Gleason score: 7 Histologic grading system: 5 grade system Number of biopsy cores examined: 12 Number of biopsy cores positive: 4 Location of positive needle core biopsies: One side   05/28/2022 Initial Diagnosis   Malignant neoplasm of prostate   03/27/2023  Imaging   MRI L Hip Heterogeneous osseous metastasis involving the pelvis involving the L4 and L5 vertebral bodies Probable extraosseous extension and small pathologic fracture of left ischial tuberosity with cortical disruption laterally.  Moderate sized partial-thickness tear of the adjacent left hamstring tendon origin Bone marrow edema surrounding the left SI joint with mild edema type signal along the superior margin of the left piriformis muscle.  Finding could be secondary to extraosseous extension of metastasis or represent posttraumatic change Small partial-thickness tear in the left gluteus muscle minimus tendon at the insertion on the greater trochanter with associated reactive bone marrow edema in the superior greater trochanter. Small low signal intensity focus along the insertional fibers of the left gluteus tendons which could represent calcium  hydroxyapatite deposit Mild osteoarthritis of both. Possible enlarged left internal iliac/perirectal lymph node measuring 1.8 cm   04/08/2023 PET scan   PSMA PET 1. Extensive radiotracer avid skeletal metastasis involving the axillary and appendicular skeleton. 2. Extensive radiotracer avid lymph node metastasis in the pelvis, retroperitoneum, and mediastinum. 3. Multiple radiotracer avid small pulmonary metastasis.   04/10/2023 Tumor Marker   PSA 107   05/14/2023 - 07/17/2023 Chemotherapy   Patient is on Treatment Plan : PROSTATE Docetaxel  (73) q21d      Genetic Testing   Negative CancerNext-Expanded +RNAinsight panel. The CancerNext-Expanded gene panel offered by [facility] and includes sequencing, rearrangement, and RNA analysis for the following 76 genes: AIP, ALK, APC, ATM, AXIN2, BAP1, BARD1, BMPR1A, BRCA1, BRCA2, BRIP1, CDC73, CDH1, CDK4, CDKN1B, CDKN2A, CEBPA, CHEK2, CTNNA1, DDX41, DICER1, ETV6, FH, FLCN, GATA2, LZTR1, MAX, MBD4, MEN1, MET, MLH1, MSH2, MSH3, MSH6, MUTYH, NF1, NF2, NTHL1, PALB2, PHOX2B, PMS2, POT1, PRKAR1A, PTCH1,  PTEN, RAD51C, RAD51D, RB1, RET, RUNX1, SDHA, SDHAF2, SDHB, SDHC, SDHD, SMAD4, SMARCA4, SMARCB1, SMARCE1, STK11, SUFU, TMEM127, TP53, TSC1, TSC2, VHL, and WT1 (sequencing and deletion/duplication); EGFR, HOXB13, KIT, MITF, PDGFRA, POLD1, and POLE (sequencing only); EPCAM and GREM1 (deletion/duplication only). Report date 06/28/23.    Prostate cancer metastatic to multiple sites [facility])  04/08/2023 PET scan   PSMA PET 1. Extensive radiotracer avid skeletal metastasis involving the axillary and appendicular skeleton. 2. Extensive radiotracer avid lymph node metastasis in the pelvis, retroperitoneum, and mediastinum. 3. Multiple radiotracer avid small pulmonary metastasis.   04/09/2023 Initial Diagnosis   Prostate cancer metastatic to multiple sites [facility])   04/10/2023 Tumor Marker   PSA 107   04/10/2023 Cancer Staging   Staging form: Prostate, AJCC 8th Edition - Clinical stage from 04/10/2023: Stage IVB (rcT0, cN1, cM1c, PSA: 107, Grade Group: 2) - Signed by Patrcia Cough, MD on 04/11/2023 Stage prefix: Recurrence Prostate specific antigen (PSA) range: 20 or greater Gleason score: 7 Histologic grading system: 5 grade system      PHYSICAL EXAMINATION: ECOG PERFORMANCE STATUS: 0  Vitals:   03/18/24 0834 03/18/24 0837  BP: (!) 144/89 122/84  Pulse: 80   Resp: 16   Temp: 97.7 F (36.5 C)   SpO2: 100%    Filed Weights   03/18/24 0834  Weight: 192 lb (87.1 kg)    GENERAL: alert, no distress and comfortable SKIN: skin color normal and no jaundice  EYES:  sclera clear NECK: No palpable mass LYMPH:  no palpable  cervical, axillary lymphadenopathy  LUNGS: clear to auscultation and no wheeze or rales with normal breathing effort HEART: regular rate & rhythm  ABDOMEN: abdomen soft, non-tender and nondistended. Musculoskeletal: no edema   Relevant data reviewed during this visit included labs.  New labs ordered.

## 2024-03-16 NOTE — Assessment & Plan Note (Addendum)
 Continue Orgovyx  and darolutamide  Repeat labs in mid March PSA undetectable

## 2024-03-18 ENCOUNTER — Inpatient Hospital Stay

## 2024-03-18 VITALS — BP 122/84 | HR 80 | Temp 97.7°F | Resp 16 | Ht 72.0 in | Wt 192.0 lb

## 2024-03-18 DIAGNOSIS — D649 Anemia, unspecified: Secondary | ICD-10-CM | POA: Diagnosis not present

## 2024-03-18 DIAGNOSIS — M8589 Other specified disorders of bone density and structure, multiple sites: Secondary | ICD-10-CM | POA: Insufficient documentation

## 2024-03-18 DIAGNOSIS — C61 Malignant neoplasm of prostate: Secondary | ICD-10-CM | POA: Diagnosis not present

## 2024-03-18 DIAGNOSIS — Z9189 Other specified personal risk factors, not elsewhere classified: Secondary | ICD-10-CM | POA: Diagnosis not present

## 2024-03-18 DIAGNOSIS — M25541 Pain in joints of right hand: Secondary | ICD-10-CM

## 2024-03-18 DIAGNOSIS — M25542 Pain in joints of left hand: Secondary | ICD-10-CM

## 2024-03-18 NOTE — Assessment & Plan Note (Signed)
 Starting Zometa  next week

## 2024-03-19 ENCOUNTER — Other Ambulatory Visit: Payer: Self-pay

## 2024-03-22 ENCOUNTER — Other Ambulatory Visit: Payer: Self-pay

## 2024-03-22 ENCOUNTER — Other Ambulatory Visit: Payer: Self-pay | Admitting: Pharmacy Technician

## 2024-03-22 NOTE — Progress Notes (Signed)
 Specialty Pharmacy Refill Coordination Note  Jasir Rother is a 74 y.o. male contacted today regarding refills of specialty medication(s) Darolutamide  (Nubeqa ); Relugolix  (ORGOVYX )   Patient requested Delivery   Delivery date: 03/23/24   Verified address: [address] [place], [place] [address]   Medication will be filled on: 03/22/24   Patient is aware of the total copay of $2084.67

## 2024-03-23 ENCOUNTER — Encounter: Payer: Self-pay | Admitting: Family Medicine

## 2024-03-23 DIAGNOSIS — M79643 Pain in unspecified hand: Secondary | ICD-10-CM

## 2024-03-25 ENCOUNTER — Inpatient Hospital Stay

## 2024-03-25 VITALS — BP 126/77 | HR 70 | Resp 16

## 2024-03-25 DIAGNOSIS — D649 Anemia, unspecified: Secondary | ICD-10-CM

## 2024-03-25 DIAGNOSIS — M8589 Other specified disorders of bone density and structure, multiple sites: Secondary | ICD-10-CM

## 2024-03-25 DIAGNOSIS — C61 Malignant neoplasm of prostate: Secondary | ICD-10-CM

## 2024-03-25 DIAGNOSIS — M25541 Pain in joints of right hand: Secondary | ICD-10-CM

## 2024-03-25 LAB — BASIC METABOLIC PANEL - CANCER CENTER ONLY
Anion gap: 12 (ref 5–15)
BUN: 21 mg/dL (ref 8–23)
CO2: 25 mmol/L (ref 22–32)
Calcium: 9.8 mg/dL (ref 8.9–10.3)
Chloride: 103 mmol/L (ref 98–111)
Creatinine: 1.06 mg/dL (ref 0.61–1.24)
GFR, Estimated: >60 mL/min
Glucose, Bld: 102 mg/dL — ABNORMAL HIGH (ref 70–99)
Potassium: 4.3 mmol/L (ref 3.5–5.1)
Sodium: 140 mmol/L (ref 135–145)

## 2024-03-25 LAB — FOLATE: Folate: >20 ng/mL

## 2024-03-25 LAB — FERRITIN: Ferritin: 324 ng/mL (ref 24–336)

## 2024-03-25 LAB — VITAMIN B12: Vitamin B-12: 584 pg/mL (ref 180–914)

## 2024-03-25 MED ORDER — ZOLEDRONIC ACID 4 MG/100ML IV SOLN
4.0000 mg | Freq: Once | INTRAVENOUS | Status: AC
  Administered 2024-03-25: 4 mg via INTRAVENOUS
  Filled 2024-03-25: qty 100

## 2024-03-25 MED ORDER — SODIUM CHLORIDE 0.9 % IV SOLN: INTRAVENOUS | Status: DC

## 2024-03-25 NOTE — Patient Instructions (Signed)

## 2024-03-26 ENCOUNTER — Telehealth: Payer: Self-pay | Admitting: *Deleted

## 2024-03-26 LAB — RHEUMATOID FACTOR: Rheumatoid fact SerPl-aCnc: 10.7 [IU]/mL

## 2024-03-26 NOTE — Telephone Encounter (Signed)
 Received VM call from pt asking about lab work done [employer].  He states he has an appt with hand MD & will take results with him.  He thinks that his RA test was neg.  Informed that I agree but will run by Dr Tina to see if he has any comments.

## 2024-03-29 ENCOUNTER — Encounter: Payer: Self-pay | Admitting: Urology

## 2024-03-29 ENCOUNTER — Ambulatory Visit: Admitting: Urology

## 2024-03-29 VITALS — BP 133/81 | HR 88 | Ht 72.0 in | Wt 184.0 lb

## 2024-03-29 DIAGNOSIS — C779 Secondary and unspecified malignant neoplasm of lymph node, unspecified: Secondary | ICD-10-CM

## 2024-03-29 DIAGNOSIS — N529 Male erectile dysfunction, unspecified: Secondary | ICD-10-CM | POA: Diagnosis not present

## 2024-03-29 DIAGNOSIS — R351 Nocturia: Secondary | ICD-10-CM | POA: Diagnosis not present

## 2024-03-29 DIAGNOSIS — C61 Malignant neoplasm of prostate: Secondary | ICD-10-CM | POA: Diagnosis not present

## 2024-03-29 DIAGNOSIS — C7951 Secondary malignant neoplasm of bone: Secondary | ICD-10-CM | POA: Diagnosis not present

## 2024-03-29 DIAGNOSIS — Z87448 Personal history of other diseases of urinary system: Secondary | ICD-10-CM

## 2024-03-29 DIAGNOSIS — Z87898 Personal history of other specified conditions: Secondary | ICD-10-CM

## 2024-03-29 LAB — URINALYSIS, ROUTINE W REFLEX MICROSCOPIC
Bilirubin, UA: NEGATIVE
Glucose, UA: NEGATIVE
Ketones, UA: NEGATIVE
Leukocytes,UA: NEGATIVE
Nitrite, UA: NEGATIVE
Specific Gravity, UA: 1.02 (ref 1.005–1.030)
Urobilinogen, Ur: 0.2 mg/dL (ref 0.2–1.0)
pH, UA: 6 (ref 5.0–7.5)

## 2024-03-29 LAB — MICROSCOPIC EXAMINATION

## 2024-03-29 NOTE — Progress Notes (Signed)
 "  Assessment: 1. Prostate cancer metastatic to multiple sites [facility])   2. History of urinary retention   3. Nocturia   4. Organic impotence     Plan: Continued management of prostate cancer by Dr. Tina. Continue silodosin  8 mg daily. I discussed possible causes of nocturia. Recommend voiding diary x 3 days.  Supplies provided with instruction. I discussed possible treatment for erectile dysfunction including a trial of oral therapy.  He would like to think about this and will let me know if he wishes to proceed. Return to office in 6 months.  Chief Complaint:  Chief Complaint  Patient presents with   Prostate Cancer    History of Present Illness:  Johnny Davenport is a 74 y.o. male who is seen for further evaluation of abnormal CT scan of bladder, history of urinary retention and metastatic prostate cancer.  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 TRUS volume:  36 ml. He is status post brachytherapy in June 2024. PSA increased to 107 in February 2025.  PSMA PET scan showed diffuse bone and nonbulky nodal metastases.  He was started on Orgovyx  and Nubeqa  and docetaxel .  He is followed by Dr. Tina with medical oncology. His PSA decreased to 0.3 in April 2025. PSA from 07/31/2023: <0.1. PSA from 08/26/2023: <0.1  LUTS with urinary retention: He has a history of lower urinary tract symptoms.  He has been on silodosin  for management.  Solifenacin was added in late April 2025. He was admitted to the hospital in May 2025 with AKI and urinary retention.  A Foley catheter was placed on 08/03/2023 with return of around 900 mL.  His creatinine was elevated to 1.87.  He was discharged home with a Foley catheter in place.  Repeat creatinine from 08/04/2023 decreased to 1.15. His solifenacin was discontinued.  He continued on silodosin .  CT abdomen and pelvis with contrast from 08/03/2023 showed mild bilateral hydronephrosis, mild  associated bladder distention, possible mild asymmetric posterior bladder wall thickening.  No prior history of retention.   His Foley catheter was removed on 08/12/2023 after a successful voiding trial in the office.  At his visit in June 2025, he was voiding spontaneously.  He continued to have a decreased stream with some urgency and urge incontinence.  He also had postvoid dribbling.  No dysuria or gross hematuria.  He felt like his urinary symptoms were gradually improving. IPSS = 21/4. Urinalysis was negative for blood.  He underwent cystoscopy in July 2025 for evaluation of the bladder asymmetry noted on CT imaging. His lower urinary tract symptoms had improved with stronger stream.  He continued with some frequency and urgency.  No dysuria or gross hematuria.  He continued on silodosin . IPSS = 11/3. Cystoscopy 7/25 showed lateral lobe enlargement of the prostate, bladder trabeculations and cellules with no mucosal abnormalities. He has continued treatment for metastatic prostate cancer with docetaxel , relugolix , and darolutamide . His most recent PSA was undetectable.  He returns today for follow-up.  He continues on silodosin  8 mg daily.  His lower urinary tract symptoms are fairly well-controlled.  He does have nocturia x 4 with occasional incontinence.  No dysuria or gross hematuria. IPSS = 8/2. He also has erectile dysfunction.  He is unable to achieve an adequate erection for intercourse.  He has not tried any therapy for ED.  Portions of the above documentation were copied from a prior visit for review purposes only.   Past  Medical History:  Past Medical History:  Diagnosis Date   Allergy    seasonal allergies   Arthritis    bilateral thumbs   BPH (benign prostatic hyperplasia)    on meds   Elevated PSA    GERD (gastroesophageal reflux disease)    with certain foods/OTC meds for tx   Hyperlipidemia    on meds   Incomplete bladder emptying 12/28/2019   11/22/2019    migraine    Prostate cancer (HCC)    Prostate cancer metastatic to multiple sites (HCC) 05/28/2022   Wears glasses     Past Surgical History:  Past Surgical History:  Procedure Laterality Date   COLONOSCOPY  2021   TA/hems-5 yr recall   Hand/Finger surgery  1997   KNEE ARTHROSCOPY W/ MENISCAL REPAIR Right    x 2   POLYPECTOMY  2016   TA   PROSTATE BIOPSY     PROSTATE SURGERY  06/24   RADIOACTIVE SEED IMPLANT N/A 08/13/2022   Procedure: RADIOACTIVE SEED IMPLANT/BRACHYTHERAPY IMPLANT;  Surgeon: Rosalind Zachary NOVAK, MD;  Location: [facility];  Service: Urology;  Laterality: N/A;  90 MINS   ROTATOR CUFF REPAIR Left    SPACE OAR INSTILLATION N/A 08/13/2022   Procedure: SPACE OAR INSTILLATION;  Surgeon: Rosalind Zachary NOVAK, MD;  Location[facility];  Service: Urology;  Laterality: N/A;   TONSILLECTOMY     WISDOM TOOTH EXTRACTION      Allergies:  No Known Allergies   Family History:  Family History  Problem Relation Age of Onset   Stroke Mother 31   Healthy Sister    Healthy Sister    Leukemia Cousin 15   Breast cancer Cousin 68   Lymphoma Cousin    Bladder Cancer Cousin 30   Breast cancer Other    Colon cancer Neg Hx    Colon polyps Neg Hx    Esophageal cancer Neg Hx    Stomach cancer Neg Hx    Rectal cancer Neg Hx     Social History:  Social History   Tobacco Use   Smoking status: Never   Smokeless tobacco: Never  Vaping Use   Vaping status: Never Used  Substance Use Topics   Alcohol use: Not Currently    Alcohol/week: 2.0 standard drinks of alcohol    Types: 1 Glasses of wine, 1 Standard drinks or equivalent per week   Drug use: No    ROS: Constitutional:  Negative for fever, chills, weight loss CV: Negative for chest pain, previous MI, hypertension Respiratory:  Negative for shortness of breath, wheezing, sleep apnea, frequent cough GI:  Negative for nausea, vomiting, bloody stool, GERD   Physical exam: BP 133/81    Pulse 88   Ht 6' (1.829 m)   Wt 184 lb (83.5 kg)   BMI 24.95 kg/m  GENERAL APPEARANCE:  Well appearing, well developed, well nourished, NAD HEENT:  Atraumatic, normocephalic, oropharynx clear NECK:  Supple without lymphadenopathy or thyromegaly ABDOMEN:  Soft, non-tender, no masses EXTREMITIES:  Moves all extremities well, without clubbing, cyanosis, or edema NEUROLOGIC:  Alert and oriented x 3, normal gait, CN II-XII grossly intact MENTAL STATUS:  appropriate BACK:  Non-tender to palpation, No CVAT SKIN:  Warm, dry, and intact   Results: U/A: 0-5 WBCs, 0-2 RBCs "

## 2024-04-13 ENCOUNTER — Other Ambulatory Visit: Payer: Self-pay

## 2024-04-13 DIAGNOSIS — C61 Malignant neoplasm of prostate: Secondary | ICD-10-CM

## 2024-04-13 MED ORDER — ORGOVYX 120 MG PO TABS
120.0000 mg | ORAL_TABLET | Freq: Every day | ORAL | 11 refills | Status: AC
  Filled 2024-04-14: qty 30, 30d supply, fill #0

## 2024-04-13 MED ORDER — NUBEQA 300 MG PO TABS
600.0000 mg | ORAL_TABLET | Freq: Two times a day (BID) | ORAL | 11 refills | Status: AC
  Filled 2024-04-14: qty 120, 30d supply, fill #0

## 2024-04-13 NOTE — Progress Notes (Signed)
 Specialty Pharmacy Refill Coordination Note  Johnny Davenport is a 74 y.o. male contacted today regarding refills of specialty medication(s) Darolutamide  (Nubeqa ); Relugolix  (ORGOVYX )   Patient requested Delivery   Delivery date: 04/20/24   Verified address: [address] [place], [place] [address]   Medication will be filled on: 04/19/24

## 2024-04-14 ENCOUNTER — Other Ambulatory Visit: Payer: Self-pay

## 2024-05-17 ENCOUNTER — Inpatient Hospital Stay

## 2024-05-20 ENCOUNTER — Inpatient Hospital Stay

## 2024-09-21 ENCOUNTER — Ambulatory Visit

## 2024-09-22 ENCOUNTER — Ambulatory Visit

## 2024-09-28 ENCOUNTER — Ambulatory Visit: Admitting: Urology
